# Patient Record
Sex: Male | Born: 1990 | Race: White | Hispanic: No | Smoking: Former smoker
Health system: Southern US, Community
[De-identification: ages and names within clinical notes are randomized; demographics above are authoritative.]

## PROBLEM LIST (undated history)

## (undated) DIAGNOSIS — J45909 Unspecified asthma, uncomplicated: Secondary | ICD-10-CM

## (undated) DIAGNOSIS — G43909 Migraine, unspecified, not intractable, without status migrainosus: Secondary | ICD-10-CM

## (undated) DIAGNOSIS — D3612 Benign neoplasm of peripheral nerves and autonomic nervous system, upper limb, including shoulder: Secondary | ICD-10-CM

## (undated) DIAGNOSIS — R0981 Nasal congestion: Secondary | ICD-10-CM

## (undated) DIAGNOSIS — F429 Obsessive-compulsive disorder, unspecified: Secondary | ICD-10-CM

## (undated) DIAGNOSIS — F329 Major depressive disorder, single episode, unspecified: Secondary | ICD-10-CM

## (undated) DIAGNOSIS — F4310 Post-traumatic stress disorder, unspecified: Secondary | ICD-10-CM

## (undated) DIAGNOSIS — F909 Attention-deficit hyperactivity disorder, unspecified type: Secondary | ICD-10-CM

## (undated) DIAGNOSIS — F913 Oppositional defiant disorder: Secondary | ICD-10-CM

## (undated) DIAGNOSIS — F32A Depression, unspecified: Secondary | ICD-10-CM

## (undated) DIAGNOSIS — R569 Unspecified convulsions: Secondary | ICD-10-CM

## (undated) HISTORY — PX: OTHER SURGICAL HISTORY: SHX169

## (undated) HISTORY — PX: TOOTH EXTRACTION: SUR596

## (undated) HISTORY — PX: TENDON LENGTHENING: SHX395

## (undated) HISTORY — DX: Oppositional defiant disorder: F91.3

## (undated) HISTORY — DX: Attention-deficit hyperactivity disorder, unspecified type: F90.9

---
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
## 2011-01-02 NOTE — Procedures (Signed)
EEG NUMBER:   CLINICAL HISTORY:  The patient is a 20 year old with episodes of staring  into space and unresponsiveness.  He has had some jerking with these  episodes.  Study is being done to look for presence of seizures  (780.02).   PROCEDURE:  The tracing is carried out on a 32-channel digital Cadwell  recorder reformatted into 16-channel montages with one devoted to EKG.  The patient was awake during the recording.  The [employer] 10/20  system lead placement was used.   DESCRIPTION OF FINDINGS:  Dominant frequency is a 10 Hz, 20-60 mcV  activity that is well-regulated and attenuates partially with eye  opening.   Background activity is a mixture of alpha and theta range components of  similar amplitudes and frontally predominant under 10 mcV beta range  activity.   Activating procedures were carried out.  Photic stimulation failed to  induce a driving response.  Hyperventilation caused no change in  background.   There was no focal slowing.  There was no interictal epileptiform  activity in the form of spikes or sharp waves.  EKG showed regular sinus  rhythm with ventricular response of 90 beats per minute.   IMPRESSION:  Normal waking record.      [name]. Sharene Skeans, M.D.  Electronically Signed     ZOX:WRUE  D:  01/20/2007 11:23:52  T:  01/20/2007 12:14:08  Job #:  [id number]
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
## 2011-01-10 ENCOUNTER — Encounter (HOSPITAL_COMMUNITY): Payer: Medicaid Other | Admitting: Psychiatry

## 2011-01-17 ENCOUNTER — Encounter (INDEPENDENT_AMBULATORY_CARE_PROVIDER_SITE_OTHER): Payer: Medicaid Other | Admitting: Psychiatry

## 2011-01-17 DIAGNOSIS — F909 Attention-deficit hyperactivity disorder, unspecified type: Secondary | ICD-10-CM

## 2011-01-17 DIAGNOSIS — F39 Unspecified mood [affective] disorder: Secondary | ICD-10-CM

## 2011-01-18 ENCOUNTER — Encounter (INDEPENDENT_AMBULATORY_CARE_PROVIDER_SITE_OTHER): Payer: Medicaid Other | Admitting: Psychology

## 2011-01-18 DIAGNOSIS — F39 Unspecified mood [affective] disorder: Secondary | ICD-10-CM

## 2011-02-19 ENCOUNTER — Encounter (INDEPENDENT_AMBULATORY_CARE_PROVIDER_SITE_OTHER): Payer: Medicaid Other | Admitting: Psychology

## 2011-02-19 DIAGNOSIS — F909 Attention-deficit hyperactivity disorder, unspecified type: Secondary | ICD-10-CM

## 2011-02-19 DIAGNOSIS — F39 Unspecified mood [affective] disorder: Secondary | ICD-10-CM

## 2011-03-07 ENCOUNTER — Encounter (INDEPENDENT_AMBULATORY_CARE_PROVIDER_SITE_OTHER): Payer: Medicaid Other | Admitting: Psychiatry

## 2011-03-07 DIAGNOSIS — F319 Bipolar disorder, unspecified: Secondary | ICD-10-CM

## 2011-03-07 DIAGNOSIS — F909 Attention-deficit hyperactivity disorder, unspecified type: Secondary | ICD-10-CM

## 2011-03-23 ENCOUNTER — Encounter (INDEPENDENT_AMBULATORY_CARE_PROVIDER_SITE_OTHER): Payer: Medicaid Other | Admitting: Psychology

## 2011-03-23 DIAGNOSIS — F909 Attention-deficit hyperactivity disorder, unspecified type: Secondary | ICD-10-CM

## 2011-03-23 DIAGNOSIS — F39 Unspecified mood [affective] disorder: Secondary | ICD-10-CM

## 2011-04-18 ENCOUNTER — Encounter (HOSPITAL_COMMUNITY): Payer: Medicaid Other | Admitting: Psychiatry

## 2011-04-25 ENCOUNTER — Encounter (HOSPITAL_COMMUNITY): Payer: Medicaid Other | Admitting: Psychology

## 2011-04-25 ENCOUNTER — Encounter (INDEPENDENT_AMBULATORY_CARE_PROVIDER_SITE_OTHER): Payer: Medicaid Other | Admitting: Psychiatry

## 2011-04-25 DIAGNOSIS — F39 Unspecified mood [affective] disorder: Secondary | ICD-10-CM

## 2011-04-25 DIAGNOSIS — F909 Attention-deficit hyperactivity disorder, unspecified type: Secondary | ICD-10-CM

## 2011-04-26 ENCOUNTER — Encounter (INDEPENDENT_AMBULATORY_CARE_PROVIDER_SITE_OTHER): Payer: Medicaid Other | Admitting: Psychology

## 2011-04-26 DIAGNOSIS — F909 Attention-deficit hyperactivity disorder, unspecified type: Secondary | ICD-10-CM

## 2011-04-26 DIAGNOSIS — F39 Unspecified mood [affective] disorder: Secondary | ICD-10-CM

## 2011-05-21 LAB — COMPREHENSIVE METABOLIC PANEL
ALT: 26
AST: 26
Alkaline Phosphatase: 124
CO2: 27
Chloride: 105
Glucose, Bld: 90
Sodium: 140
Total Bilirubin: 0.7

## 2011-05-21 LAB — URINE MICROSCOPIC-ADD ON

## 2011-05-21 LAB — VALPROIC ACID LEVEL: Valproic Acid Lvl: 79.6

## 2011-05-21 LAB — AMPHETAMINES URINE CONFIRMATION
Methamphetamine GC/MS, Ur: NEGATIVE
Methylenedioxyamphetamine: NEGATIVE
Methylenedioxyethylamphetamine: NEGATIVE

## 2011-05-21 LAB — URINALYSIS, ROUTINE W REFLEX MICROSCOPIC
Glucose, UA: NEGATIVE
Hgb urine dipstick: NEGATIVE
Leukocytes, UA: NEGATIVE
Protein, ur: 30 — AB

## 2011-05-21 LAB — DRUGS OF ABUSE SCREEN W/O ALC, ROUTINE URINE
Barbiturate Quant, Ur: NEGATIVE
Cocaine Metabolites: NEGATIVE
Phencyclidine (PCP): NEGATIVE

## 2011-05-21 LAB — LIPID PANEL
Triglycerides: 258 — ABNORMAL HIGH
VLDL: 52 — ABNORMAL HIGH

## 2011-05-21 LAB — GC/CHLAMYDIA PROBE AMP, URINE: Chlamydia, Swab/Urine, PCR: NEGATIVE

## 2011-05-21 LAB — HEPATIC FUNCTION PANEL
Albumin: 3.5
Total Bilirubin: 0.7
Total Protein: 6.1

## 2011-05-21 LAB — T3, FREE: T3, Free: 4 (ref 2.3–4.2)

## 2011-05-21 LAB — TSH: TSH: 6.128 — ABNORMAL HIGH

## 2011-05-23 ENCOUNTER — Encounter (HOSPITAL_COMMUNITY): Payer: Medicaid Other | Admitting: Psychiatry

## 2011-05-31 ENCOUNTER — Encounter (INDEPENDENT_AMBULATORY_CARE_PROVIDER_SITE_OTHER): Payer: Medicaid Other | Admitting: Psychology

## 2011-05-31 DIAGNOSIS — F39 Unspecified mood [affective] disorder: Secondary | ICD-10-CM

## 2011-05-31 DIAGNOSIS — F909 Attention-deficit hyperactivity disorder, unspecified type: Secondary | ICD-10-CM

## 2011-06-06 ENCOUNTER — Encounter (HOSPITAL_COMMUNITY): Payer: Medicaid Other | Admitting: Psychiatry

## 2011-06-06 LAB — STREP A DNA PROBE: Group A Strep Probe: NEGATIVE

## 2011-06-06 LAB — COMPREHENSIVE METABOLIC PANEL
Albumin: 3.2 — ABNORMAL LOW
Alkaline Phosphatase: 96
BUN: 18
Calcium: 7.7 — ABNORMAL LOW
Potassium: 3.8
Sodium: 131 — ABNORMAL LOW
Total Protein: 6.3

## 2011-06-06 LAB — CBC
Platelets: 173 — ABNORMAL LOW
RDW: 13.6

## 2011-06-06 LAB — RAPID STREP SCREEN (MED CTR MEBANE ONLY): Streptococcus, Group A Screen (Direct): NEGATIVE

## 2011-06-06 LAB — DIFFERENTIAL
Basophils Absolute: 0
Lymphocytes Relative: 7 — ABNORMAL LOW
Monocytes Absolute: 0 — ABNORMAL LOW
Neutro Abs: 5.9

## 2011-06-06 LAB — CULTURE, BLOOD (ROUTINE X 2)
Culture: NO GROWTH
Report Status: 7032008

## 2011-06-06 LAB — URINALYSIS, ROUTINE W REFLEX MICROSCOPIC
Glucose, UA: NEGATIVE
Hgb urine dipstick: NEGATIVE
Specific Gravity, Urine: >1.03 — ABNORMAL HIGH
pH: 6

## 2011-07-05 ENCOUNTER — Encounter (HOSPITAL_COMMUNITY): Payer: Medicaid Other | Admitting: Psychology

## 2011-07-05 ENCOUNTER — Ambulatory Visit (INDEPENDENT_AMBULATORY_CARE_PROVIDER_SITE_OTHER): Payer: Medicaid Other | Admitting: Psychology

## 2011-07-05 ENCOUNTER — Encounter (HOSPITAL_COMMUNITY): Payer: Self-pay | Admitting: Psychology

## 2011-07-05 DIAGNOSIS — F902 Attention-deficit hyperactivity disorder, combined type: Secondary | ICD-10-CM

## 2011-07-05 DIAGNOSIS — F909 Attention-deficit hyperactivity disorder, unspecified type: Secondary | ICD-10-CM

## 2011-07-05 NOTE — Progress Notes (Signed)
Patient:  [name]   DOB: 1991/06/11  MR Number: [id number]  Location: [facility] [address] [place] [address] Dept: [phone]  Start: 1 PM End: 2 PM  Provider/Observer:     Hershal Coria PSYD  Chief Complaint:      Chief Complaint  Patient presents with  . ADD    Reason For Service:     The patient was referred by his treating psychiatrist Dr. Lucianne Muss, who felt that he needed to engage in some psychotherapeutic interventions and building some of his coping skills. The patient has been seen by Dr. Lucianne Muss through the [facility] and then started seeing her here through [facility]. He is carried a number of diagnoses including conduct disorder with adolescent onset, attention deficit disorder with hyperactivity, and mood disorder NOS. He has been tried on a number of different medications including Concerta and Abilify as well as Depakote and Adderall. He does appear to be responding well to his current medications but there was a concern or interest in helping build more coping skills to do is reduced intellectual capacity and poor coping skills.  Interventions Strategy:  Cognitive/behavioral psychotherapy  Participation Level:   Active  Participation Quality:  Appropriate      Behavioral Observation:  Well Groomed, Alert, and Appropriate.   Current Psychosocial Factors: The patient reports that his girlfriend did turn out to be pregnant and after 3 months of not having a period she began feeling bad and she was taken into see a gynecologist. They did discover that she was in fact pregnant even though she had been negative on home pregnancy test. The problem with her feeling badly he had to do with the fact that she was miscarriage and she is no longer pregnant. The patient is girlfriend at began talking about getting married but family members have  strongly suggested they need to wait and the patient's family is concerned that his girlfriend is quite immature and has a lot of growing up to do. We addressed this issue today particularly with the patient's impulsivity and the fact that they need to take her time in this relationship.  Content of Session:   Reviewed her symptoms and continue to work on building coping skills and strategies.  Current Status:   The patient has done quite well and is clearly improving his ability to inhibit impulsive responding.  Patient Progress:   Very good  Target Goals:   Reduce impulsive and maladaptive behaviors and engage in better use of coping skills.  Last Reviewed:   07/05/2011  Goals Addressed Today:    Today the primary goal had to do with impulsive behaviors and the issue of him by planning a wedding with his girlfriend with whom he has been dating for just a couple of months was the primary focus of this topic today.  Impression/Diagnosis:   The patient has been diagnosed with a number of psychiatric conditions including mood disorder, attention deficit disorder, conduct disorder and other variations of these conditions. I do think that the most appropriate diagnosis at this time is one of attention deficit disorder combined type with the continued rule out of a mood disorder. I have not seen the patient in any significant fluctuation in mood state since I started seeing him. His mood is always been quite cheerful and pleasant with no indications of manic or hypomanic episodes or depressive episodes to this point.  Diagnosis:  Axis I:  1. Attention deficit disorder with hyperactivity   2. Attention deficit hyperactivity disorder, combined type         Axis II: No diagnosis

## 2011-07-18 ENCOUNTER — Encounter (HOSPITAL_COMMUNITY): Payer: Medicaid Other | Admitting: Psychiatry

## 2011-07-18 ENCOUNTER — Encounter (HOSPITAL_COMMUNITY): Payer: Self-pay | Admitting: Psychiatry

## 2011-07-18 ENCOUNTER — Ambulatory Visit (INDEPENDENT_AMBULATORY_CARE_PROVIDER_SITE_OTHER): Payer: Medicaid Other | Admitting: Psychiatry

## 2011-07-18 DIAGNOSIS — F909 Attention-deficit hyperactivity disorder, unspecified type: Secondary | ICD-10-CM

## 2011-07-18 DIAGNOSIS — F902 Attention-deficit hyperactivity disorder, combined type: Secondary | ICD-10-CM | POA: Insufficient documentation

## 2011-07-18 DIAGNOSIS — F39 Unspecified mood [affective] disorder: Secondary | ICD-10-CM | POA: Insufficient documentation

## 2011-07-18 MED ORDER — GUANFACINE HCL ER 3 MG PO TB24: 3.0000 mg | ORAL_TABLET | Freq: Every day | ORAL | Status: DC

## 2011-07-18 MED ORDER — AMPHETAMINE-DEXTROAMPHET ER 30 MG PO CP24: 30.0000 mg | ORAL_CAPSULE | ORAL | Status: DC

## 2011-07-18 NOTE — Progress Notes (Signed)
  [facility] [id number] Progress Note  RIGLEY NIESS [id number] 20 y.o.  07/18/2011 4:03 PM  Chief Complaint: I'm doing okay at school, also doing well with living by myself. I had a girlfriend but I broke up with her as she was very demanding. Mom feels that the patient is doing well living by himself but adds that he does visit his dad on regular basis.  Any symptoms of mania, any problems with aggression, any side effects any safety issues  History of Present Illness: Suicidal Ideation: No Plan Formed: No Patient has means to carry out plan: No  Homicidal Ideation: No Plan Formed: No Patient has means to carry out plan: No  Review of Systems: Psychiatric: Agitation: No Hallucination: No Depressed Mood: No Insomnia: No Hypersomnia: No Altered Concentration: No Feels Worthless: No Grandiose Ideas: No Belief In Special Powers: No New/Increased Substance Abuse: No Compulsions: No  Neurologic: Headache: No Seizure: No Paresthesias: No  Past Medical Family, Social History: In 12th grade  Outpatient Encounter Prescriptions as of 07/18/2011  Medication Sig Dispense Refill  . amphetamine-dextroamphetamine (ADDERALL XR) 30 MG 24 hr capsule Take 1 capsule (30 mg total) by mouth every morning.  30 capsule  0  . guanFACINE 3 MG TB24 Take 1 tablet (3 mg total) by mouth daily.  30 tablet  2  . DISCONTD: amphetamine-dextroamphetamine (ADDERALL XR) 30 MG 24 hr capsule Take 30 mg by mouth every morning.        Marland Kitchen DISCONTD: guanFACINE (INTUNIV) 2 MG TB24 Take 3 mg by mouth daily.          Past Psychiatric History/Hospitalization(s): Anxiety: No Bipolar Disorder: Yes Depression: Yes Mania: No Psychosis: No Schizophrenia: No Personality Disorder: No Hospitalization for psychiatric illness: Yes History of Electroconvulsive Shock Therapy: No Prior Suicide Attempts: No  Physical Exam: Constitutional:  BP 120/70  Ht 5\' 5"  (1.651 m)  Wt 136 lb (61.689 kg)  BMI 22.63  kg/m2  General Appearance: alert, oriented, no acute distress and well nourished  Musculoskeletal: Strength & Muscle Tone: within normal limits Gait & Station: normal Patient leans: N/A  Psychiatric: Speech (describe rate, volume, coherence, spontaneity, and abnormalities if any): Normal in volume rate and tone spontaneous  Thought Process (describe rate, content, abstract reasoning, and computation): Organized, goal-directed, age-appropriate  Associations: Intact  Thoughts: normal  Mental Status: Orientation: oriented to person, place, time/date and situation Mood & Affect: normal affect Attention Span & Concentration: OK  Medical Decision Making (Choose Three): Established Problem, Stable/Improving (1), Review of Psycho-Social Stressors (1), Review of Last Therapy Session (1) and Review of Medication Regimen & Side Effects (2)  Assessment: Axis I: Mood disorder NOS, ADHD combined type moderate severity  Axis II: Deferred  Axis III: Wears glasses, acne  Axis IV: Moderate  Axis V: 65   Plan: Continue Intuniv and Adderall XR. Discussed the need to keep his room clean adds the patient is living by himself. In regards to finances, mom is still helping the patient to pay his bills. Discussed the need for him to learn how to manage money in order to be completely independent. Also discussed in length choices in regards to relationships and the need to get to know someone prior to making long-term plans. Call when necessary Followup in 2 months  Nelly Rout, MD 07/18/2011

## 2011-07-28 ENCOUNTER — Other Ambulatory Visit (HOSPITAL_COMMUNITY): Payer: Self-pay | Admitting: Psychiatry

## 2011-08-02 ENCOUNTER — Ambulatory Visit (INDEPENDENT_AMBULATORY_CARE_PROVIDER_SITE_OTHER): Payer: Medicaid Other | Admitting: Psychology

## 2011-08-02 DIAGNOSIS — F909 Attention-deficit hyperactivity disorder, unspecified type: Secondary | ICD-10-CM

## 2011-08-10 NOTE — Progress Notes (Signed)
Patient:  [name]   DOB: 10-22-1990  MR Number: [id number]  Location: BEHAVIORAL [facility] [address] [place] [address] Dept: [phone]  Start: 1 PM End: 2 PM  Provider/Observer:     Hershal Coria PSYD  Chief Complaint:      Chief Complaint  Patient presents with  . ADD    Reason For Service:     The patient was referred by his treating psychiatrist Dr. Lucianne Muss, who felt that he needed to engage in some psychotherapeutic interventions and building some of his coping skills. The patient has been seen by Dr. Lucianne Muss through the [facility] mental health Department and then started seeing her here through [facility]. He is carried a number of diagnoses including conduct disorder with adolescent onset, attention deficit disorder with hyperactivity, and mood disorder NOS. He has been tried on a number of different medications including Concerta and Abilify as well as Depakote and Adderall. He does appear to be responding well to his current medications but there was a concern or interest in helping build more coping skills to do is reduced intellectual capacity and poor coping skills.  Interventions Strategy:  Cognitive/behavioral psychotherapy  Participation Level:   Active  Participation Quality:  Appropriate      Behavioral Observation:  Well Groomed, Alert, and Appropriate.   Current Psychosocial Factors: The patient reports that his girlfriend did turn out to be pregnant and after 3 months of not having a period she began feeling bad and she was taken into see a gynecologist. They did discover that she was in fact pregnant even though she had been negative on home pregnancy test. The problem with her feeling badly he had to do with the fact that she was miscarriage and she is no longer pregnant. The patient is girlfriend at began talking about getting married but family members have  strongly suggested they need to wait and the patient's family is concerned that his girlfriend is quite immature and has a lot of growing up to do. We addressed this issue today particularly with the patient's impulsivity and the fact that they need to take her time in this relationship.  Content of Session:   Reviewed her symptoms and continue to work on building coping skills and strategies.  Current Status:   The patient has done quite well and is clearly improving his ability to inhibit impulsive responding.  Patient Progress:   Very good  Target Goals:   Reduce impulsive and maladaptive behaviors and engage in better use of coping skills.  Last Reviewed:   08/02/2011  Goals Addressed Today:    We continue to work on goals related to his impulse control difficulties and making impulsive and poor judgments.  Impression/Diagnosis:   The patient has been diagnosed with a number of psychiatric conditions including mood disorder, attention deficit disorder, conduct disorder and other variations of these conditions. I do think that the most appropriate diagnosis at this time is one of attention deficit disorder combined type with the continued rule out of a mood disorder. I have not seen the patient in any significant fluctuation in mood state since I started seeing him. His mood is always been quite cheerful and pleasant with no indications of manic or hypomanic episodes or depressive episodes to this point.  Diagnosis:    Axis I:  1. Attention deficit disorder with hyperactivity         Axis II: No diagnosis

## 2011-08-30 ENCOUNTER — Ambulatory Visit (INDEPENDENT_AMBULATORY_CARE_PROVIDER_SITE_OTHER): Payer: Medicaid Other | Admitting: Psychology

## 2011-08-30 DIAGNOSIS — F909 Attention-deficit hyperactivity disorder, unspecified type: Secondary | ICD-10-CM

## 2011-08-30 NOTE — Progress Notes (Signed)
Patient:  [name]   DOB: [birth year]  MR Number: [id number]  Location: BEHAVIORAL [facility] [address] [place] [address] Dept: [phone]  Start: 1 PM End: 2 PM  Provider/Observer:     Hershal Coria PSYD  Chief Complaint:      Chief Complaint  Patient presents with  . ADHD  . Stress  . Family Problem    Reason For Service:     The patient was referred by his treating psychiatrist Dr. Lucianne Muss, who felt that he needed to engage in some psychotherapeutic interventions and building some of his coping skills. The patient has been seen by Dr. Lucianne Muss through the [facility] mental health Department and then started seeing her here through [facility]. He is carried a number of diagnoses including conduct disorder with adolescent onset, attention deficit disorder with hyperactivity, and mood disorder NOS. He has been tried on a number of different medications including Concerta and Abilify as well as Depakote and Adderall. He does appear to be responding well to his current medications but there was a concern or interest in helping build more coping skills to do is reduced intellectual capacity and poor coping skills.  Interventions Strategy:  Cognitive/behavioral psychotherapy  Participation Level:   Active  Participation Quality:  Appropriate      Behavioral Observation:  Well Groomed, Alert, and Appropriate.   Current Psychosocial Factors: The patient has ended his engagement with his girlfriend and already has begun talking with the previous girlfriend spending time with her. His "fiance" that he is separated from his daughter that they will get back together 2 weeks before valentines. I pointed out to him that if there was an issue someone had with a relationship that a set period of time would be hard to gauge when that issues with magically disappear if people didn't work on them. In  fact, the patient admits to spending almost $1000 on her over Christmas and then in breaking up soon after that. The patient is on disability and does not have money to be spending like this. We pointed out the possible issue of her wanting to get back together just before valentines and the fact that this is a very immature thinking and neither they want to be in a relationship or not and that somehow valentines was given a magically fix everything was very immature and thinking.  Content of Session:   Reviewed her symptoms and continue to work on building coping skills and strategies.  Current Status:   The patient has done quite well and is clearly improving his ability to inhibit impulsive responding.  Patient Progress:   Very good  Target Goals:   Reduce impulsive and maladaptive behaviors and engage in better use of coping skills.  Last Reviewed:   08/30/2011  Goals Addressed Today:    Today we continued to work on building coping skills to deal with more dull aspects of his life. The patient had become engaged with a young woman who is just out of high school and from his reports have been very immature similar to him. We have talked about the potential that this engagement may have some real problems along the weight to help him prepare for that. In fact, this has happened recently and they have stopped the engagement. We talked about developing his own personal skills is not a situation where he feels like he has to be in a relationship with someone and  that he built his skills..  Impression/Diagnosis:   The patient has been diagnosed with a number of psychiatric conditions including mood disorder, attention deficit disorder, conduct disorder and other variations of these conditions. I do think that the most appropriate diagnosis at this time is one of attention deficit disorder combined type with the continued rule out of a mood disorder. I have not seen the patient in any significant  fluctuation in mood state since I started seeing him. His mood is always been quite cheerful and pleasant with no indications of manic or hypomanic episodes or depressive episodes to this point.  Diagnosis:    Axis I:  1. Attention deficit disorder with hyperactivity         Axis II: No diagnosis

## 2011-09-19 ENCOUNTER — Ambulatory Visit (INDEPENDENT_AMBULATORY_CARE_PROVIDER_SITE_OTHER): Payer: Medicaid Other | Admitting: Psychiatry

## 2011-09-19 ENCOUNTER — Encounter (HOSPITAL_COMMUNITY): Payer: Self-pay | Admitting: Psychiatry

## 2011-09-19 VITALS — BP 120/78 | Ht 65.0 in | Wt 135.0 lb

## 2011-09-19 DIAGNOSIS — F902 Attention-deficit hyperactivity disorder, combined type: Secondary | ICD-10-CM

## 2011-09-19 DIAGNOSIS — F909 Attention-deficit hyperactivity disorder, unspecified type: Secondary | ICD-10-CM

## 2011-09-19 DIAGNOSIS — F39 Unspecified mood [affective] disorder: Secondary | ICD-10-CM

## 2011-09-19 MED ORDER — AMPHETAMINE-DEXTROAMPHET ER 30 MG PO CP24: 30.0000 mg | ORAL_CAPSULE | ORAL | Status: DC

## 2011-09-19 MED ORDER — AMPHETAMINE-DEXTROAMPHET ER 30 MG PO CP24: 30.0000 mg | ORAL_CAPSULE | Freq: Every day | ORAL | Status: DC

## 2011-09-19 MED ORDER — GUANFACINE HCL ER 3 MG PO TB24: 3.0000 mg | ORAL_TABLET | Freq: Every day | ORAL | Status: DC

## 2011-09-19 NOTE — Progress Notes (Signed)
Patient ID: [name], male   DOB: 1991-04-04, 21 y.o.   MRN: [id number]  [facility] [address] Progress Note  HAROUT SCHEURICH [id number] 21 y.o.  09/19/2011 11:14 AM  Chief Complaint: I'm doing okay at school, i have only math that I need in order to graduate. I am also doing well with living by myself. I had a fiance but I broke up with her as she was very demanding. Mom feels that the patient is doing well living by himself but adds that he does  still struggle with managing his finances and so Mom is still managing them.Patient still  his dad on regular basis.   Patient denies any symptoms of mania, any problems with aggression, any side effects any safety issues  History of Present Illness: Suicidal Ideation: No Plan Formed: No Patient has means to carry out plan: No  Homicidal Ideation: No Plan Formed: No Patient has means to carry out plan: No  Review of Systems: Psychiatric: Agitation: No Hallucination: No Depressed Mood: No Insomnia: No Hypersomnia: No Altered Concentration: No Feels Worthless: No Grandiose Ideas: No Belief In Special Powers: No New/Increased Substance Abuse: No Compulsions: No  Neurologic: Headache: No Seizure: No Paresthesias: No  Past Medical Family, Social History: In 12th grade  Outpatient Encounter Prescriptions as of 09/19/2011  Medication Sig Dispense Refill  . amphetamine-dextroamphetamine (ADDERALL XR) 30 MG 24 hr capsule Take 1 capsule (30 mg total) by mouth every morning.  30 capsule  0  . GuanFACINE HCl 3 MG TB24 Take 1 tablet (3 mg total) by mouth daily.  30 tablet  2  . DISCONTD: amphetamine-dextroamphetamine (ADDERALL XR) 30 MG 24 hr capsule Take 1 capsule (30 mg total) by mouth every morning.  30 capsule  0  . DISCONTD: guanFACINE 3 MG TB24 Take 1 tablet (3 mg total) by mouth daily.  30 tablet  2  . amphetamine-dextroamphetamine (ADDERALL XR) 30 MG 24 hr capsule Take 1 capsule (30 mg total) by mouth daily.  30  capsule  0    Past Psychiatric History/Hospitalization(s): Anxiety: No Bipolar Disorder: Yes Depression: Yes Mania: No Psychosis: No Schizophrenia: No Personality Disorder: No Hospitalization for psychiatric illness: Yes History of Electroconvulsive Shock Therapy: No Prior Suicide Attempts: No  Physical Exam: Constitutional:  BP 120/78  Ht 5\' 5"  (1.651 m)  Wt 135 lb (61.236 kg)  BMI 22.47 kg/m2  General Appearance: alert, oriented, no acute distress and well nourished  Musculoskeletal: Strength & Muscle Tone: within normal limits Gait & Station: normal Patient leans: N/A  Psychiatric: Speech (describe rate, volume, coherence, spontaneity, and abnormalities if any): Normal in volume rate and tone spontaneous  Thought Process (describe rate, content, abstract reasoning, and computation): Organized, goal-directed, age-appropriate  Associations: Intact  Thoughts: normal  Mental Status: Orientation: oriented to person, place, time/date and situation Mood & Affect: normal affect Attention Span & Concentration: OK  Medical Decision Making (Choose Three): Established Problem, Stable/Improving (1), Review of Psycho-Social Stressors (1), Review of Last Therapy Session (1) and Review of Medication Regimen & Side Effects (2)  Assessment: Axis I: Mood disorder NOS, ADHD combined type moderate severity  Axis II: Deferred  Axis III: Wears glasses, acne  Axis IV: Moderate  Axis V: 65   Plan: Continue Intuniv and Adderall XR. Discussed  again the need to keep his apartment clean.  In regards to finances, mom is still helping the patient to pay his bills. Discussed the need for him to learn how to manage money in  order to be completely independent. Also discussed in length choices in regards to relationships and the need to get to know someone prior to making long-term plans. Call when necessary Continue to see therapist regularly. Followup in 2 months  Nelly Rout,  MD 09/19/2011

## 2011-09-27 ENCOUNTER — Ambulatory Visit (INDEPENDENT_AMBULATORY_CARE_PROVIDER_SITE_OTHER): Payer: Medicaid Other | Admitting: Psychology

## 2011-09-27 DIAGNOSIS — F909 Attention-deficit hyperactivity disorder, unspecified type: Secondary | ICD-10-CM

## 2011-09-27 DIAGNOSIS — F39 Unspecified mood [affective] disorder: Secondary | ICD-10-CM

## 2011-10-11 ENCOUNTER — Telehealth (HOSPITAL_COMMUNITY): Payer: Self-pay | Admitting: *Deleted

## 2011-10-19 ENCOUNTER — Encounter (HOSPITAL_COMMUNITY): Payer: Self-pay | Admitting: Psychology

## 2011-10-19 NOTE — Progress Notes (Signed)
Patient:  [name]   DOB: 06/13/1991  MR Number: [id number]  Location: BEHAVIORAL [facility] [address] [place] [address] Dept: [phone]  Start: 11:30 AM End: 12:30 PM  Provider/Observer:     Hershal Coria PSYD  Chief Complaint:      Chief Complaint  Patient presents with  . Anxiety  . Stress  . Manic Behavior  . ADHD    Reason For Service:     The patient was referred by his treating psychiatrist Dr. Lucianne Muss, who felt that he needed to engage in some psychotherapeutic interventions and building some of his coping skills. The patient has been seen by Dr. Lucianne Muss through the [facility] mental health Department and then started seeing her here through [facility]. He is carried a number of diagnoses including conduct disorder with adolescent onset, attention deficit disorder with hyperactivity, and mood disorder NOS. He has been tried on a number of different medications including Concerta and Abilify as well as Depakote and Adderall. He does appear to be responding well to his current medications but there was a concern or interest in helping build more coping skills to do is reduced intellectual capacity and poor coping skills.  Interventions Strategy:  Cognitive/behavioral psychotherapy  Participation Level:   Active  Participation Quality:  Appropriate      Behavioral Observation:  Well Groomed, Alert, and Appropriate.   Current Psychosocial Factors: The patient is doing better now that he has gotten over the breakup of his relationship with his girlfriend. However, he continues to impulsive with balance between ideas about various girls and we talked about this type of behavior pattern is quite difficult for him. how.  Content of Session:   Reviewed her symptoms and continue to work on building coping skills and strategies.  Current Status:   The patient has done quite well  and is clearly improving his ability to inhibit impulsive responding.  Patient Progress:   Very good  Target Goals:   Reduce impulsive and maladaptive behaviors and engage in better use of coping skills.  Last Reviewed:   09/27/2011  Goals Addressed Today:    Today we worked on the significant impulsive and maladaptive behaviors that he has particular around his relationships with girls and his desire to have a girlfriend and/or fianc..  Impression/Diagnosis:   The patient has been diagnosed with a number of psychiatric conditions including mood disorder, attention deficit disorder, conduct disorder and other variations of these conditions. I do think that the most appropriate diagnosis at this time is one of attention deficit disorder combined type with the continued rule out of a mood disorder. I have not seen the patient in any significant fluctuation in mood state since I started seeing him. His mood is always been quite cheerful and pleasant with no indications of manic or hypomanic episodes or depressive episodes to this point.  Diagnosis:    Axis I:  1. Unspecified episodic mood disorder   2. Attention deficit disorder with hyperactivity         Axis II: No diagnosis

## 2011-10-25 ENCOUNTER — Ambulatory Visit (INDEPENDENT_AMBULATORY_CARE_PROVIDER_SITE_OTHER): Payer: Medicaid Other | Admitting: Psychology

## 2011-10-25 ENCOUNTER — Encounter (HOSPITAL_COMMUNITY): Payer: Self-pay | Admitting: Psychology

## 2011-10-25 DIAGNOSIS — F39 Unspecified mood [affective] disorder: Secondary | ICD-10-CM

## 2011-10-25 DIAGNOSIS — F909 Attention-deficit hyperactivity disorder, unspecified type: Secondary | ICD-10-CM

## 2011-10-25 NOTE — Progress Notes (Signed)
Patient:  [name]   DOB: 1991-02-17  MR Number: [id number]  Location: BEHAVIORAL [facility] [address] [place] [address] Dept: [phone]  Start: 11:30 AM End: 12:30 PM  Provider/Observer:     Hershal Coria PSYD  Chief Complaint:      Chief Complaint  Patient presents with  . Anxiety  . Depression  . Agitation  . ADHD    Reason For Service:     The patient was referred by his treating psychiatrist Dr. Lucianne Muss, who felt that he needed to engage in some psychotherapeutic interventions and building some of his coping skills. The patient has been seen by Dr. Lucianne Muss through the [facility] mental health Department and then started seeing her here through [facility]. He is carried a number of diagnoses including conduct disorder with adolescent onset, attention deficit disorder with hyperactivity, and mood disorder NOS. He has been tried on a number of different medications including Concerta and Abilify as well as Depakote and Adderall. He does appear to be responding well to his current medications but there was a concern or interest in helping build more coping skills to do is reduced intellectual capacity and poor coping skills.  Interventions Strategy:  Cognitive/behavioral psychotherapy  Participation Level:   Active  Participation Quality:  Appropriate      Behavioral Observation:  Well Groomed, Alert, and Appropriate.   Current Psychosocial Factors: The patient reports that he is back with [name] who he had broken off with because of her controlling behaviors. However, the patient reports he has taken advice to work on this relationship very slowly. It is clear that the patient has a tremendous drive to have a significant relationship but this is part of his impulsive types of behaviors and wards against episodes of depression..  Content of Session:   Reviewed her  symptoms and continue to work on building coping skills and strategies.  Current Status:   The patient has done quite well and is clearly improving his ability to inhibit impulsive responding.  Patient Progress:   Very good  Target Goals:   Reduce impulsive and maladaptive behaviors and engage in better use of coping skills.  Last Reviewed:   10/25/2011  Goals Addressed Today:    We continued work on issues related to his extremely impulsive types of behaviors and his, spastic attempts to get other people to be involved in his life.  Impression/Diagnosis:   The patient has been diagnosed with a number of psychiatric conditions including mood disorder, attention deficit disorder, conduct disorder and other variations of these conditions. I do think that the most appropriate diagnosis at this time is one of attention deficit disorder combined type with the continued rule out of a mood disorder. I have not seen the patient in any significant fluctuation in mood state since I started seeing him. His mood is always been quite cheerful and pleasant with no indications of manic or hypomanic episodes or depressive episodes to this point.  Diagnosis:    Axis I:  1. Unspecified episodic mood disorder   2. Unspecified hyperkinetic syndrome of childhood         Axis II: No diagnosis

## 2011-11-14 ENCOUNTER — Encounter (HOSPITAL_COMMUNITY): Payer: Self-pay | Admitting: Psychiatry

## 2011-11-14 ENCOUNTER — Ambulatory Visit (INDEPENDENT_AMBULATORY_CARE_PROVIDER_SITE_OTHER): Payer: Medicaid Other | Admitting: Psychiatry

## 2011-11-14 VITALS — BP 112/74 | Ht 65.0 in | Wt 139.4 lb

## 2011-11-14 DIAGNOSIS — F902 Attention-deficit hyperactivity disorder, combined type: Secondary | ICD-10-CM

## 2011-11-14 DIAGNOSIS — F909 Attention-deficit hyperactivity disorder, unspecified type: Secondary | ICD-10-CM

## 2011-11-14 MED ORDER — AMPHETAMINE-DEXTROAMPHET ER 30 MG PO CP24: 30.0000 mg | ORAL_CAPSULE | ORAL | Status: DC

## 2011-11-14 MED ORDER — GUANFACINE HCL ER 3 MG PO TB24
3.0000 mg | ORAL_TABLET | Freq: Every day | ORAL | Status: DC
Start: 2011-11-14 — End: 2012-01-09

## 2011-11-14 MED ORDER — AMPHETAMINE-DEXTROAMPHET ER 30 MG PO CP24: 30.0000 mg | ORAL_CAPSULE | Freq: Every day | ORAL | Status: DC

## 2011-11-14 NOTE — Progress Notes (Signed)
Patient ID: [name], male   DOB: 1990-09-12, 21 y.o.   MRN: [id number]  [facility] [id number] Progress Note  [name] [id number] 21 y.o.  11/14/2011 11:06 AM  Chief Complaint: I'm doing okay at school, I have only math that I need in order to graduate. I am  Now living with a friend as I got evicted as I had a dog and did not have permission. I have a male friend. Mom feels that the patient is doing well  but adds that he does  still struggle with managing his finances as he spends more than what he gets every month.Patient still  his dad on regular basis.   Patient denies any symptoms of mania, any problems with aggression, any side effects any safety issues  History of Present Illness: Suicidal Ideation: No Plan Formed: No Patient has means to carry out plan: No  Homicidal Ideation: No Plan Formed: No Patient has means to carry out plan: No  Review of Systems: Psychiatric: Agitation: No Hallucination: No Depressed Mood: No Insomnia: No Hypersomnia: No Altered Concentration: No Feels Worthless: No Grandiose Ideas: No Belief In Special Powers: No New/Increased Substance Abuse: No Compulsions: No  Neurologic: Headache: No Seizure: No Paresthesias: No  Past Medical Family, Social History: In 12th grade  Outpatient Encounter Prescriptions as of 11/14/2011  Medication Sig Dispense Refill  . amphetamine-dextroamphetamine (ADDERALL XR) 30 MG 24 hr capsule Take 1 capsule (30 mg total) by mouth every morning.  30 capsule  0  . amphetamine-dextroamphetamine (ADDERALL XR) 30 MG 24 hr capsule Take 1 capsule (30 mg total) by mouth daily.  30 capsule  0  . GuanFACINE HCl 3 MG TB24 Take 1 tablet (3 mg total) by mouth daily.  30 tablet  2  . DISCONTD: amphetamine-dextroamphetamine (ADDERALL XR) 30 MG 24 hr capsule Take 1 capsule (30 mg total) by mouth every morning.  30 capsule  0  . DISCONTD: amphetamine-dextroamphetamine (ADDERALL XR) 30 MG 24 hr capsule Take  1 capsule (30 mg total) by mouth daily.  30 capsule  0  . DISCONTD: GuanFACINE HCl 3 MG TB24 Take 1 tablet (3 mg total) by mouth daily.  30 tablet  2    Past Psychiatric History/Hospitalization(s): Anxiety: No Bipolar Disorder: Yes Depression: Yes Mania: No Psychosis: No Schizophrenia: No Personality Disorder: No Hospitalization for psychiatric illness: Yes History of Electroconvulsive Shock Therapy: No Prior Suicide Attempts: No  Physical Exam: Constitutional:  There were no vitals taken for this visit.  General Appearance: alert, oriented, no acute distress and well nourished  Musculoskeletal: Strength & Muscle Tone: within normal limits Gait & Station: normal Patient leans: N/A  Psychiatric: Speech (describe rate, volume, coherence, spontaneity, and abnormalities if any): Normal in volume rate and tone spontaneous  Thought Process (describe rate, content, abstract reasoning, and computation): Organized, goal-directed, age-appropriate  Associations: Intact  Thoughts: normal  Mental Status: Orientation: oriented to person, place, time/date and situation Mood & Affect: normal affect Attention Span & Concentration: OK  Medical Decision Making (Choose Three): Established Problem, Stable/Improving (1), Review of Psycho-Social Stressors (1), Review of Last Therapy Session (1) and Review of Medication Regimen & Side Effects (2)  Assessment: Axis I: Mood disorder NOS, ADHD combined type moderate severity  Axis II: Deferred  Axis III: Wears glasses, acne  Axis IV: Moderate  Axis V: 65   Plan: Continue Intuniv and Adderall XR. Discussed the need to graduate, get extra help in Math as patient is struggling.  In regards  to finances, mom is still helping the patient to pay his bills. Discussed the need for him to learn how to manage money in order to be completely independent and also spend less than what patient gets every month. Also discussed in length choices in  regards to relationships and the need to get to know someone prior to making long-term plans. Call when necessary Continue to see therapist regularly. Followup in 2 months  Nelly Rout, MD 11/14/2011

## 2011-12-04 ENCOUNTER — Ambulatory Visit (INDEPENDENT_AMBULATORY_CARE_PROVIDER_SITE_OTHER): Payer: Medicaid Other | Admitting: Psychology

## 2011-12-04 DIAGNOSIS — F39 Unspecified mood [affective] disorder: Secondary | ICD-10-CM

## 2011-12-04 DIAGNOSIS — F909 Attention-deficit hyperactivity disorder, unspecified type: Secondary | ICD-10-CM

## 2011-12-05 ENCOUNTER — Encounter (HOSPITAL_COMMUNITY): Payer: Self-pay | Admitting: Psychology

## 2011-12-05 NOTE — Progress Notes (Signed)
Patient:  [name]   DOB: [birth year]  MR Number: [id number]  Location: BEHAVIORAL [facility] [address] [place] [address] Dept: [phone]  Start: 1 PM End: 2 PM  Provider/Observer:     Hershal Coria PSYD  Chief Complaint:      Chief Complaint  Patient presents with  . Stress  . ADHD  . Agitation    Reason For Service:     The patient was referred by his treating psychiatrist Dr. Lucianne Muss, who felt that he needed to engage in some psychotherapeutic interventions and building some of his coping skills. The patient has been seen by Dr. Lucianne Muss through the [facility] mental health Department and then started seeing her here through [facility]. He is carried a number of diagnoses including conduct disorder with adolescent onset, attention deficit disorder with hyperactivity, and mood disorder NOS. He has been tried on a number of different medications including Concerta and Abilify as well as Depakote and Adderall. He does appear to be responding well to his current medications but there was a concern or interest in helping build more coping skills to do is reduced intellectual capacity and poor coping skills.  Interventions Strategy:  Cognitive/behavioral psychotherapy  Participation Level:   Active  Participation Quality:  Appropriate      Behavioral Observation:  Well Groomed, Alert, and Appropriate.   Current Psychosocial Factors: The patient reports that he was able to get a companion dog which is a [place] and he appears to be taking great care of it and very focused on it. However, he is impulsively thinking that he is going to buy his mother's house now that she is trying to sell it although did sales for $80,000.  The patient has a monthly income of $700 and therefore this is extremely impractical and an example of some of the impractical thinking that he has.  Content of  Session:   Reviewed her symptoms and continue to work on building coping skills and strategies.  Current Status:   The patient has done quite well and is clearly improving his ability to inhibit impulsive responding.  Patient Progress:   Very good  Target Goals:   Reduce impulsive and maladaptive behaviors and engage in better use of coping skills.  Last Reviewed:   12/04/2011  Goals Addressed Today:    Today we worked on manipulative and impulsive types of behaviors that the patient continues to seem to engage in. The patient was rather hyper today and presenting irrational types of thinking around purchasing a home that his mother currently ends in house for sale. We uses this as an example to further work on the target goal of better healing within coping with impulsive types of behaviors.  Impression/Diagnosis:   The patient has been diagnosed with a number of psychiatric conditions including mood disorder, attention deficit disorder, conduct disorder and other variations of these conditions. I do think that the most appropriate diagnosis at this time is one of attention deficit disorder combined type with the continued rule out of a mood disorder. I have not seen the patient in any significant fluctuation in mood state since I started seeing him. His mood is always been quite cheerful and pleasant with no indications of manic or hypomanic episodes or depressive episodes to this point.  Diagnosis:    Axis I:  1. Unspecified episodic mood disorder   2. Unspecified hyperkinetic syndrome of childhood  Axis II: No diagnosis

## 2011-12-31 ENCOUNTER — Ambulatory Visit (INDEPENDENT_AMBULATORY_CARE_PROVIDER_SITE_OTHER): Payer: Medicaid Other | Admitting: Psychology

## 2011-12-31 DIAGNOSIS — F909 Attention-deficit hyperactivity disorder, unspecified type: Secondary | ICD-10-CM

## 2011-12-31 DIAGNOSIS — F39 Unspecified mood [affective] disorder: Secondary | ICD-10-CM

## 2012-01-02 ENCOUNTER — Encounter (HOSPITAL_COMMUNITY): Payer: Self-pay | Admitting: Psychology

## 2012-01-02 NOTE — Progress Notes (Signed)
Patient:  [name]   DOB: 1990/10/30  MR Number: [id number]  Location: BEHAVIORAL [facility] [address] [place] [address] Dept: [phone]  Start: 1 PM End: 2 PM  Provider/Observer:     Hershal Coria PSYD  Chief Complaint:      Chief Complaint  Patient presents with  . Agitation  . Stress  . Manic Behavior    Reason For Service:     The patient was referred by his treating psychiatrist Dr. Lucianne Muss, who felt that he needed to engage in some psychotherapeutic interventions and building some of his coping skills. The patient has been seen by Dr. Lucianne Muss through the [facility] mental health Department and then started seeing her here through [facility]. He is carried a number of diagnoses including conduct disorder with adolescent onset, attention deficit disorder with hyperactivity, and mood disorder NOS. He has been tried on a number of different medications including Concerta and Abilify as well as Depakote and Adderall. He does appear to be responding well to his current medications but there was a concern or interest in helping build more coping skills to do is reduced intellectual capacity and poor coping skills.  Interventions Strategy:  Cognitive/behavioral psychotherapy  Participation Level:   Active  Participation Quality:  Appropriate      Behavioral Observation:  Well Groomed, Alert, and Appropriate.   Current Psychosocial Factors: The patient is continued a more stable and realistic relationship with his girlfriend. He reports that this is helping him cope better now that he is not looking at [facility] in this relationship and trying to get engaged or other relatively unrealistic types of activities. He does describe some significant stress he still experiences from her with regard to her expectations are unrealistic based on his financial, vocational, and living  situations.  Content of Session:   Reviewed her symptoms and continue to work on building coping skills and strategies.  Current Status:   The patient is continuing to show improvement in his judgment and understanding of situations. The last visit he was more manic than he was this visit and it reflected in his unrealistic desires to try by his mother's house. There is no financial way that he could achieve this and is working a more realistic goals.  Patient Progress:   Very good  Target Goals:   Reduce impulsive and maladaptive behaviors and engage in better use of coping skills.  Last Reviewed:   12/31/2011  Goals Addressed Today:    We continue to work on target goals of reducing the impulsive and manipulative behaviors and better at implementation of coping skills for judgment and future planning.  Impression/Diagnosis:   The patient has been diagnosed with a number of psychiatric conditions including mood disorder, attention deficit disorder, conduct disorder and other variations of these conditions. I do think that the most appropriate diagnosis at this time is one of attention deficit disorder combined type with the continued rule out of a mood disorder. I have not seen the patient in any significant fluctuation in mood state since I started seeing him. His mood is always been quite cheerful and pleasant with no indications of manic or hypomanic episodes or depressive episodes to this point.  Diagnosis:    Axis I:  1. Mood disorder   2. Unspecified hyperkinetic syndrome of childhood         Axis II: No diagnosis

## 2012-01-09 ENCOUNTER — Telehealth (HOSPITAL_COMMUNITY): Payer: Self-pay | Admitting: *Deleted

## 2012-01-09 ENCOUNTER — Ambulatory Visit (HOSPITAL_COMMUNITY): Payer: Medicaid Other | Admitting: Psychiatry

## 2012-01-09 ENCOUNTER — Ambulatory Visit (HOSPITAL_COMMUNITY): Payer: Self-pay | Admitting: Psychiatry

## 2012-01-09 DIAGNOSIS — F902 Attention-deficit hyperactivity disorder, combined type: Secondary | ICD-10-CM

## 2012-01-09 MED ORDER — GUANFACINE HCL ER 3 MG PO TB24: 3.0000 mg | ORAL_TABLET | Freq: Every day | ORAL | Status: DC

## 2012-01-09 MED ORDER — AMPHETAMINE-DEXTROAMPHET ER 30 MG PO CP24
30.0000 mg | ORAL_CAPSULE | Freq: Every day | ORAL | Status: DC
Start: 2012-01-09 — End: 2012-02-13

## 2012-01-09 NOTE — Telephone Encounter (Signed)
Refill done.  

## 2012-01-31 ENCOUNTER — Ambulatory Visit (INDEPENDENT_AMBULATORY_CARE_PROVIDER_SITE_OTHER): Payer: MEDICAID | Admitting: Psychology

## 2012-01-31 DIAGNOSIS — F39 Unspecified mood [affective] disorder: Secondary | ICD-10-CM

## 2012-02-13 ENCOUNTER — Encounter (HOSPITAL_COMMUNITY): Payer: Self-pay | Admitting: Psychiatry

## 2012-02-13 ENCOUNTER — Ambulatory Visit (INDEPENDENT_AMBULATORY_CARE_PROVIDER_SITE_OTHER): Payer: Medicaid Other | Admitting: Psychiatry

## 2012-02-13 VITALS — BP 124/76 | Ht 65.0 in | Wt 147.4 lb

## 2012-02-13 DIAGNOSIS — F909 Attention-deficit hyperactivity disorder, unspecified type: Secondary | ICD-10-CM

## 2012-02-13 DIAGNOSIS — F902 Attention-deficit hyperactivity disorder, combined type: Secondary | ICD-10-CM

## 2012-02-13 DIAGNOSIS — F39 Unspecified mood [affective] disorder: Secondary | ICD-10-CM

## 2012-02-13 MED ORDER — AMPHETAMINE-DEXTROAMPHET ER 30 MG PO CP24: 30.0000 mg | ORAL_CAPSULE | ORAL | Status: DC

## 2012-02-13 MED ORDER — AMPHETAMINE-DEXTROAMPHET ER 30 MG PO CP24: 30.0000 mg | ORAL_CAPSULE | Freq: Every day | ORAL | Status: DC

## 2012-02-13 MED ORDER — GUANFACINE HCL ER 3 MG PO TB24: 3.0000 mg | ORAL_TABLET | Freq: Every day | ORAL | Status: DC

## 2012-02-13 NOTE — Progress Notes (Signed)
Patient ID: [name], male   DOB: 1991/03/30, 21 y.o.   MRN: [id number]  [facility] [address] Progress Note  [name] [id number] 21 y.o.  02/13/2012 11:37 AM  Chief Complaint:  I have only Algebra 1 that I need in order to graduate. I know I am making poor choices in regards to girls. I did get a year of supervised probation for helping a 20 year old girl who wanted to run away from home.I am otherwise doing well. Patient still sees  his dad on regular basis.   Patient denies any symptoms of mania, any problems with aggression, any side effects, any safety issues  History of Present Illness: Suicidal Ideation: No Plan Formed: No Patient has means to carry out plan: No  Homicidal Ideation: No Plan Formed: No Patient has means to carry out plan: No  Review of Systems: Psychiatric: Agitation: No Hallucination: No Depressed Mood: No Insomnia: No Hypersomnia: No Altered Concentration: No Feels Worthless: No Grandiose Ideas: No Belief In Special Powers: No New/Increased Substance Abuse: No Compulsions: No  Neurologic: Headache: No Seizure: No Paresthesias: No  Past Medical Family, Social History: In 12th grade, lives with a friend and his family  Outpatient Encounter Prescriptions as of 02/13/2012  Medication Sig Dispense Refill  . amphetamine-dextroamphetamine (ADDERALL XR) 30 MG 24 hr capsule Take 1 capsule (30 mg total) by mouth daily.  30 capsule  0  . amphetamine-dextroamphetamine (ADDERALL XR) 30 MG 24 hr capsule Take 1 capsule (30 mg total) by mouth every morning.  30 capsule  0  . GuanFACINE HCl 3 MG TB24 Take 1 tablet (3 mg total) by mouth daily.  30 tablet  2  . DISCONTD: amphetamine-dextroamphetamine (ADDERALL XR) 30 MG 24 hr capsule Take 1 capsule (30 mg total) by mouth every morning.  30 capsule  0  . DISCONTD: amphetamine-dextroamphetamine (ADDERALL XR) 30 MG 24 hr capsule Take 1 capsule (30 mg total) by mouth daily.  30 capsule  0  .  DISCONTD: GuanFACINE HCl 3 MG TB24 Take 1 tablet (3 mg total) by mouth daily.  30 tablet  2    Past Psychiatric History/Hospitalization(s): Anxiety: No Bipolar Disorder: Yes Depression: Yes Mania: No Psychosis: No Schizophrenia: No Personality Disorder: No Hospitalization for psychiatric illness: Yes History of Electroconvulsive Shock Therapy: No Prior Suicide Attempts: No  Physical Exam: Constitutional:  BP 124/76  Ht 5\' 5"  (1.651 m)  Wt 147 lb 6.4 oz (66.86 kg)  BMI 24.53 kg/m2  General Appearance: alert, oriented, no acute distress and well nourished  Musculoskeletal: Strength & Muscle Tone: within normal limits Gait & Station: normal Patient leans: N/A  Psychiatric: Speech (describe rate, volume, coherence, spontaneity, and abnormalities if any): Normal in volume rate and tone spontaneous  Thought Process (describe rate, content, abstract reasoning, and computation): Organized, goal-directed, age-appropriate  Associations: Intact  Thoughts: normal  Mental Status: Orientation: oriented to person, place, time/date and situation Mood & Affect: normal affect Attention Span & Concentration: OK  Medical Decision Making (Choose Three): Established Problem, Stable/Improving (1), Review of Psycho-Social Stressors (1), Review of Last Therapy Session (1) and Review of Medication Regimen & Side Effects (2)  Assessment: Axis I: Mood disorder NOS, ADHD combined type moderate severity  Axis II: Deferred  Axis III: Wears glasses, acne  Axis IV: Moderate  Axis V: 65   Plan: Continue Intuniv and Adderall XR. Discussed the need to graduate, get extra help in [place] as patient is struggling.  Discussed again patient's living situation  at length Also discussed in length again choices in regards to relationships and the need to not help or date minors. Patient currently on probation. Call when necessary Continue to see therapist regularly. Followup in 2  months  Nelly Rout, MD 02/13/2012

## 2012-03-06 ENCOUNTER — Ambulatory Visit (HOSPITAL_COMMUNITY): Payer: Self-pay | Admitting: Psychology

## 2012-03-28 ENCOUNTER — Other Ambulatory Visit (HOSPITAL_COMMUNITY): Payer: Self-pay | Admitting: Psychiatry

## 2012-04-16 ENCOUNTER — Encounter (HOSPITAL_COMMUNITY): Payer: Self-pay | Admitting: Psychiatry

## 2012-04-16 ENCOUNTER — Ambulatory Visit (INDEPENDENT_AMBULATORY_CARE_PROVIDER_SITE_OTHER): Payer: Medicaid Other | Admitting: Psychiatry

## 2012-04-16 VITALS — BP 118/72 | Ht 65.0 in | Wt 144.4 lb

## 2012-04-16 DIAGNOSIS — F909 Attention-deficit hyperactivity disorder, unspecified type: Secondary | ICD-10-CM

## 2012-04-16 DIAGNOSIS — F902 Attention-deficit hyperactivity disorder, combined type: Secondary | ICD-10-CM

## 2012-04-16 DIAGNOSIS — F39 Unspecified mood [affective] disorder: Secondary | ICD-10-CM

## 2012-04-16 MED ORDER — AMPHETAMINE-DEXTROAMPHET ER 30 MG PO CP24: 30.0000 mg | ORAL_CAPSULE | Freq: Every day | ORAL | Status: DC

## 2012-04-16 MED ORDER — GUANFACINE HCL ER 3 MG PO TB24: 3.0000 mg | ORAL_TABLET | Freq: Every day | ORAL | Status: DC

## 2012-04-16 MED ORDER — AMPHETAMINE-DEXTROAMPHET ER 30 MG PO CP24: 30.0000 mg | ORAL_CAPSULE | ORAL | Status: DC

## 2012-04-16 NOTE — Progress Notes (Signed)
Patient ID: [name], male   DOB: 10-21-1990, 21 y.o.   MRN: [id number]  [facility] [address] Progress Note  CAYDYN SPRUNG [id number] 21 y.o.  04/16/2012 10:03 AM  Chief Complaint: I have to take algebra 1 at [facility] [employer] for 2-3 weeks and then I graduate in December. I'm not living at the place I was living at as I am on probation and some of the people there were smoking marijuana. I'm living at my mom's house which is up for sale. Mom adds that they're looking at apartments and also plan to look at a boarding house. Patient says that he does no want to go to the boarding house but mom feels that it is best to keep all options open. She adds that there is also a restaurant close to the boarding house which has a job opening.     Patient denies any symptoms of mania, any problems with aggression, any side effects, any safety issuesat this visit. Mom agrees with patient but adds that he has a tunnel vision and he needs to look at all options rather than just one thing.   History of Present Illness: Suicidal Ideation: No Plan Formed: No Patient has means to carry out plan: No  Homicidal Ideation: No Plan Formed: No Patient has means to carry out plan: No  Review of Systems: Psychiatric: Agitation: No Hallucination: No Depressed Mood: No Insomnia: No Hypersomnia: No Altered Concentration: No Feels Worthless: No Grandiose Ideas: No Belief In Special Powers: No New/Increased Substance Abuse: No Compulsions: No  Neurologic: Headache: No Seizure: No Paresthesias: No  Past Medical Family, Social History: In 12th grade, patient will be attending classes for algebra one at [facility] [employer]. He is currently living in his mom's house by himself.  Outpatient Encounter Prescriptions as of 04/16/2012  Medication Sig Dispense Refill  . amphetamine-dextroamphetamine (ADDERALL XR) 30 MG 24 hr capsule Take 1 capsule (30 mg total) by mouth every morning.  30 capsule  0    . amphetamine-dextroamphetamine (ADDERALL XR) 30 MG 24 hr capsule Take 1 capsule (30 mg total) by mouth daily.  30 capsule  0  . GuanFACINE HCl 3 MG TB24 Take 1 tablet (3 mg total) by mouth daily.  30 tablet  2  . DISCONTD: amphetamine-dextroamphetamine (ADDERALL XR) 30 MG 24 hr capsule Take 1 capsule (30 mg total) by mouth daily.  30 capsule  0  . DISCONTD: amphetamine-dextroamphetamine (ADDERALL XR) 30 MG 24 hr capsule Take 1 capsule (30 mg total) by mouth every morning.  30 capsule  0  . DISCONTD: GuanFACINE HCl 3 MG TB24 Take 1 tablet (3 mg total) by mouth daily.  30 tablet  2    Past Psychiatric History/Hospitalization(s): Anxiety: No Bipolar Disorder: Yes Depression: Yes Mania: No Psychosis: No Schizophrenia: No Personality Disorder: No Hospitalization for psychiatric illness: Yes History of Electroconvulsive Shock Therapy: No Prior Suicide Attempts: No  Physical Exam: Constitutional:  BP 118/72  Ht 5\' 5"  (1.651 m)  Wt 144 lb 6.4 oz (65.499 kg)  BMI 24.03 kg/m2  General Appearance: alert, oriented, no acute distress and well nourished  Musculoskeletal: Strength & Muscle Tone: within normal limits Gait & Station: normal Patient leans: N/A  Psychiatric: Speech (describe rate, volume, coherence, spontaneity, and abnormalities if any): Normal in volume rate and tone spontaneous  Thought Process (describe rate, content, abstract reasoning, and computation): Organized, goal-directed, age-appropriate  Associations: Intact  Thoughts: normal  Mental Status: Orientation: oriented to person, place, time/date  and situation Mood & Affect: normal affect Attention Span & Concentration: OK  Medical Decision Making (Choose Three): Established Problem, Stable/Improving (1), Review of Psycho-Social Stressors (1), New Problem, with no additional work-up planned (3), Review of Last Therapy Session (1) and Review of Medication Regimen & Side Effects (2)  Assessment: Axis I: Mood  disorder NOS, ADHD combined type moderate severity  Axis II: Deferred  Axis III: Wears glasses, acne  Axis IV: Moderate  Axis V: 65   Plan: Continue Intuniv 3 mg daily and Adderall XR 30 mg daily for ADHD combined type Discussed the need to graduate, get help at [facility] [employer] as patient struggles with algebra 1   Discussed again patient's living situation at lengthalong with the need to have a job.  Patient currently on probation. Call when necessary Continue to see therapist regularly. Followup in one month   Toy Eisemann, MD 04/16/2012

## 2012-04-18 ENCOUNTER — Ambulatory Visit (HOSPITAL_COMMUNITY): Payer: Self-pay | Admitting: Psychology

## 2012-05-01 ENCOUNTER — Ambulatory Visit (INDEPENDENT_AMBULATORY_CARE_PROVIDER_SITE_OTHER): Payer: 59 | Admitting: Psychology

## 2012-05-01 ENCOUNTER — Encounter (HOSPITAL_COMMUNITY): Payer: Self-pay | Admitting: *Deleted

## 2012-05-01 DIAGNOSIS — F909 Attention-deficit hyperactivity disorder, unspecified type: Secondary | ICD-10-CM

## 2012-05-01 DIAGNOSIS — F39 Unspecified mood [affective] disorder: Secondary | ICD-10-CM

## 2012-05-02 ENCOUNTER — Encounter (HOSPITAL_COMMUNITY): Payer: Self-pay | Admitting: Psychology

## 2012-05-02 NOTE — Progress Notes (Signed)
Patient:  [name]   DOB: 20-Feb-1991  MR Number: [id number]  Location: BEHAVIORAL [facility] [address] [place] [address] Dept: [phone]  Start: 10 AM End: 11 AM  Provider/Observer:     Hershal Coria PSYD  Chief Complaint:      Chief Complaint  Patient presents with  . Agitation  . Other    Impulsive and hyperkinetic behaviors    Reason For Service:     The patient was referred by his treating psychiatrist Dr. Lucianne Muss, who felt that he needed to engage in some psychotherapeutic interventions and building some of his coping skills. The patient has been seen by Dr. Lucianne Muss through the [facility] mental health Department and then started seeing her here through [facility]. He is carried a number of diagnoses including conduct disorder with adolescent onset, attention deficit disorder with hyperactivity, and mood disorder NOS. He has been tried on a number of different medications including Concerta and Abilify as well as Depakote and Adderall. He does appear to be responding well to his current medications but there was a concern or interest in helping build more coping skills to do is reduced intellectual capacity and poor coping skills.  Interventions Strategy:  Cognitive/behavioral psychotherapy  Participation Level:   Active  Participation Quality:  Appropriate      Behavioral Observation:  Well Groomed, Alert, and Appropriate.   Current Psychosocial Factors: The patient is continued to struggle with impulsive types of behaviors. He has been able to move into a house and is on by his mother and is unsure how long this will last. However, after he moved his older than a soft into buying a dog for them that was half Rottweiler in later today documented taking possession of another one of these and they could not find a 4. The patient is really not capable financially or behaviorally  able to take care of these 2 dogs and this is an example of impulsive behaviors and his desire to have companionship but not really able to think things through. We were able to address this issue and he is planning on finding a home for one of these thoughts..  Content of Session:   Reviewed her symptoms and continue to work on building coping skills and strategies.  Current Status:   The patient is continuing to show improvement in his judgment and understanding of situations. The last visit he was more manic than he was this visit and it reflected in his unrealistic desires to try by his mother's house. There is no financial way that he could achieve this and is working a more realistic goals.  Patient Progress:   Very good  Target Goals:   Reduce impulsive and maladaptive behaviors and engage in better use of coping skills.  Last Reviewed:   05/02/2012  Goals Addressed Today:    We continue to work on target goals of reducing the impulsive and manipulative behaviors and better at implementation of coping skills for judgment and future planning.  Impression/Diagnosis:   The patient has been diagnosed with a number of psychiatric conditions including mood disorder, attention deficit disorder, conduct disorder and other variations of these conditions. I do think that the most appropriate diagnosis at this time is one of attention deficit disorder combined type with the continued rule out of a mood disorder. I have not seen the patient in any significant fluctuation in mood state since I started seeing him.  His mood is always been quite cheerful and pleasant with no indications of manic or hypomanic episodes or depressive episodes to this point.  Diagnosis:    Axis I:  1. Mood disorder   2. Unspecified hyperkinetic syndrome of childhood         Axis II: No diagnosis

## 2012-05-14 ENCOUNTER — Encounter (HOSPITAL_COMMUNITY): Payer: Self-pay | Admitting: Psychiatry

## 2012-05-14 ENCOUNTER — Ambulatory Visit (INDEPENDENT_AMBULATORY_CARE_PROVIDER_SITE_OTHER): Payer: MEDICAID | Admitting: Psychiatry

## 2012-05-14 VITALS — BP 122/70 | Ht 65.0 in | Wt 157.4 lb

## 2012-05-14 DIAGNOSIS — F909 Attention-deficit hyperactivity disorder, unspecified type: Secondary | ICD-10-CM

## 2012-05-14 DIAGNOSIS — F39 Unspecified mood [affective] disorder: Secondary | ICD-10-CM

## 2012-05-14 DIAGNOSIS — F902 Attention-deficit hyperactivity disorder, combined type: Secondary | ICD-10-CM

## 2012-05-14 MED ORDER — AMPHETAMINE-DEXTROAMPHET ER 30 MG PO CP24: 30.0000 mg | ORAL_CAPSULE | ORAL | Status: DC

## 2012-05-14 MED ORDER — AMPHETAMINE-DEXTROAMPHET ER 30 MG PO CP24: 30.0000 mg | ORAL_CAPSULE | Freq: Every day | ORAL | Status: DC

## 2012-05-14 MED ORDER — GUANFACINE HCL ER 3 MG PO TB24: 3.0000 mg | ORAL_TABLET | Freq: Every day | ORAL | Status: DC

## 2012-05-14 NOTE — Progress Notes (Signed)
Patient ID: [name], male   DOB: [birth year], 21 y.o.   MRN: [id number]  [facility] [address] Progress Note  [name] [id number] 21 y.o.  05/14/2012 11:19 AM  Chief Complaint: I am still at [facility] [employer], I been out of school for a week as I have scabies and I'm being treated for. I have been trying to stay out of trouble at the 20 year old goal has been calling me, has showed up at my place but I called the sheriff. Mom agrees with patient and adds that he is making better choices and states that she has told 20 year old that she's not allowed at her house.    Patient denies any symptoms of mania, any problems with aggression, any side effects, any safety issues at this visit.   History of Present Illness: Suicidal Ideation: No Plan Formed: No Patient has means to carry out plan: No  Homicidal Ideation: No Plan Formed: No Patient has means to carry out plan: No  Review of Systems: Psychiatric: Agitation: No Hallucination: No Depressed Mood: No Insomnia: No Hypersomnia: No Altered Concentration: No Feels Worthless: No Grandiose Ideas: No Belief In Special Powers: No New/Increased Substance Abuse: No Compulsions: No  Neurologic: Headache: No Seizure: No Paresthesias: No  Past Medical Family, Social History: In 12th grade, patient will be attending classes for algebra one at the [employer]. He is currently living in his mom's house by himself. Patient says that he's dating an 20 year old  Outpatient Encounter Prescriptions as of 05/14/2012  Medication Sig Dispense Refill  . amphetamine-dextroamphetamine (ADDERALL XR) 30 MG 24 hr capsule Take 1 capsule (30 mg total) by mouth daily.  30 capsule  0  . amphetamine-dextroamphetamine (ADDERALL XR) 30 MG 24 hr capsule Take 1 capsule (30 mg total) by mouth every morning.  30 capsule  0  . GuanFACINE HCl 3 MG TB24 Take 1 tablet (3 mg total) by mouth daily.  30 tablet  2  . DISCONTD:  amphetamine-dextroamphetamine (ADDERALL XR) 30 MG 24 hr capsule Take 1 capsule (30 mg total) by mouth every morning.  30 capsule  0  . DISCONTD: amphetamine-dextroamphetamine (ADDERALL XR) 30 MG 24 hr capsule Take 1 capsule (30 mg total) by mouth daily.  30 capsule  0  . DISCONTD: GuanFACINE HCl 3 MG TB24 Take 1 tablet (3 mg total) by mouth daily.  30 tablet  2    Past Psychiatric History/Hospitalization(s): Anxiety: No Bipolar Disorder: Yes Depression: Yes Mania: No Psychosis: No Schizophrenia: No Personality Disorder: No Hospitalization for psychiatric illness: Yes History of Electroconvulsive Shock Therapy: No Prior Suicide Attempts: No  Physical Exam: Constitutional:  BP 122/70  Ht 5\' 5"  (1.651 m)  Wt 157 lb 6.4 oz (71.396 kg)  BMI 26.19 kg/m2  General Appearance: alert, oriented, no acute distress and well nourished  Musculoskeletal: Strength & Muscle Tone: within normal limits Gait & Station: normal Patient leans: N/A  Psychiatric: Speech (describe rate, volume, coherence, spontaneity, and abnormalities if any): Normal in volume rate and tone spontaneous  Thought Process (describe rate, content, abstract reasoning, and computation): Organized, goal-directed, age-appropriate  Associations: Intact  Thoughts: normal  Mental Status: Orientation: oriented to person, place, time/date and situation Mood & Affect: normal affect Attention Span & Concentration: OK  Medical Decision Making (Choose Three): Established Problem, Stable/Improving (1), Review of Psycho-Social Stressors (1), New Problem, with no additional work-up planned (3), Review of Last Therapy Session (1) and Review of Medication Regimen & Side Effects (2)  Assessment: Axis I:  Mood disorder NOS, ADHD combined type moderate severity  Axis II: Deferred  Axis III: Wears glasses, acne, scabies  Axis IV: Moderate  Axis V: 65   Plan: Continue Intuniv 3 mg daily and Adderall XR 30 mg daily for ADHD  combined type Continue the treatment for scabies as prescribed by his primary care physician Continue classes at the [employer] Discussed the need to stay away from the 20 year old and to call the sheriff if she shows up at his house again Patient currently on probation. Call when necessary Continue to see therapist regularly. Followup in 3 months  Brent Rout, MD 05/14/2012

## 2012-05-20 NOTE — Progress Notes (Signed)
a 

## 2012-05-29 ENCOUNTER — Encounter (HOSPITAL_COMMUNITY): Payer: Self-pay | Admitting: *Deleted

## 2012-05-29 ENCOUNTER — Ambulatory Visit (INDEPENDENT_AMBULATORY_CARE_PROVIDER_SITE_OTHER): Payer: 59 | Admitting: Psychology

## 2012-05-29 ENCOUNTER — Encounter (HOSPITAL_COMMUNITY): Payer: Self-pay | Admitting: Psychology

## 2012-05-29 DIAGNOSIS — F909 Attention-deficit hyperactivity disorder, unspecified type: Secondary | ICD-10-CM

## 2012-05-29 DIAGNOSIS — F39 Unspecified mood [affective] disorder: Secondary | ICD-10-CM

## 2012-05-29 NOTE — Progress Notes (Signed)
Patient:  [name]   DOB: 1990/11/14  MR Number: [id number]  Location: BEHAVIORAL [facility] [address] [place] [address] Dept: [phone]  Start: 3 PM End: 4 PM  Provider/Observer:     Hershal Coria PSYD  Chief Complaint:      Chief Complaint  Patient presents with  . Agitation  . Stress  . ADD    Reason For Service:     The patient was referred by his treating psychiatrist Dr. Lucianne Muss, who felt that he needed to engage in some psychotherapeutic interventions and building some of his coping skills. The patient has been seen by Dr. Lucianne Muss through the [facility] mental health Department and then started seeing her here through [facility]. He is carried a number of diagnoses including conduct disorder with adolescent onset, attention deficit disorder with hyperactivity, and mood disorder NOS. He has been tried on a number of different medications including Concerta and Abilify as well as Depakote and Adderall. He does appear to be responding well to his current medications but there was a concern or interest in helping build more coping skills to do is reduced intellectual capacity and poor coping skills.  Interventions Strategy:  Cognitive/behavioral psychotherapy  Participation Level:   Active  Participation Quality:  Appropriate      Behavioral Observation:  Well Groomed, Alert, and Appropriate.   Current Psychosocial Factors: The patient comes in today and reports he is still having trouble with the under a curl that he helped out. He denies that the relationship never became a sexual one but he did help her out in several ways and she told him how she was having trouble with her mother who was abusing drugs. He got in a good bit of trouble and ended up going to jail at one point. He has tried his best to distance himself from this young lady but she continues to call his current  girlfriend and make comments on his current girlfriend's face look page. The patient reports he also is dealing with his impulsiveness regarding that as he now has 3 dogs and encourage him to find homes for the tube Rottweilers.  Content of Session:   Reviewed her symptoms and continue to work on building coping skills and strategies.  Current Status:   The patient has gotten a permanent place to stay and actually was able to talk his mother and letting him stay in the house that she is but he does not have any solid plans for when she wore it she is able to sell it.  Patient Progress:   Very good  Target Goals:   Reduce impulsive and maladaptive behaviors and engage in better use of coping skills.  Last Reviewed:   05/29/2012  Goals Addressed Today:    We continue to work on target goals of reducing the impulsive and manipulative behaviors and better at implementation of coping skills for judgment and future planning.  Impression/Diagnosis:   The patient has been diagnosed with a number of psychiatric conditions including mood disorder, attention deficit disorder, conduct disorder and other variations of these conditions. I do think that the most appropriate diagnosis at this time is one of attention deficit disorder combined type with the continued rule out of a mood disorder. I have not seen the patient in any significant fluctuation in mood state since I started seeing him. His mood is always been quite cheerful and pleasant with no indications of  manic or hypomanic episodes or depressive episodes to this point.  Diagnosis:    Axis I:  1. Mood disorder   2. Unspecified hyperkinetic syndrome of childhood         Axis II: No diagnosis

## 2012-06-30 ENCOUNTER — Ambulatory Visit (INDEPENDENT_AMBULATORY_CARE_PROVIDER_SITE_OTHER): Payer: MEDICAID | Admitting: Psychology

## 2012-06-30 ENCOUNTER — Ambulatory Visit (HOSPITAL_COMMUNITY): Payer: Self-pay | Admitting: Psychology

## 2012-06-30 DIAGNOSIS — F39 Unspecified mood [affective] disorder: Secondary | ICD-10-CM

## 2012-06-30 DIAGNOSIS — F909 Attention-deficit hyperactivity disorder, unspecified type: Secondary | ICD-10-CM

## 2012-07-10 ENCOUNTER — Encounter (HOSPITAL_COMMUNITY): Payer: Self-pay | Admitting: Psychology

## 2012-07-10 NOTE — Progress Notes (Signed)
Patient:  [name]   DOB: November 15, 1990  MR Number: [id number]  Location: BEHAVIORAL [facility] [address] [place] [address] Dept: [phone]  Start: 10 AM End: 11 AM  Provider/Observer:     Hershal Coria PSYD  Chief Complaint:      Chief Complaint  Patient presents with  . Anxiety  . Agitation  . Stress    Reason For Service:     The patient was referred by his treating psychiatrist Dr. Lucianne Muss, who felt that he needed to engage in some psychotherapeutic interventions and building some of his coping skills. The patient has been seen by Dr. Lucianne Muss through the [facility] mental health Department and then started seeing her here through [facility]. He is carried a number of diagnoses including conduct disorder with adolescent onset, attention deficit disorder with hyperactivity, and mood disorder NOS. He has been tried on a number of different medications including Concerta and Abilify as well as Depakote and Adderall. He does appear to be responding well to his current medications but there was a concern or interest in helping build more coping skills to do is reduced intellectual capacity and poor coping skills.  Interventions Strategy:  Cognitive/behavioral psychotherapy  Participation Level:   Active  Participation Quality:  Appropriate      Behavioral Observation:  Well Groomed, Alert, and Appropriate.   Current Psychosocial Factors: The patient reports that he has been quite agitated recently and reports that some of this has to do with the stress he is experiencing associated with him decided to go back to college. He is planning to go to [employer] or [employer] and has worked for for his own.  Content of Session:   Reviewed current symptoms and continue to work on building coping skills and strategies.  Current Status:   The patient  has gotten a permanent place to stay and actually was able to talk his mother and letting him stay in the house that she is but he does not have any solid plans for when she wore it she is able to sell it.  Patient Progress:   Very good  Target Goals:   Reduce impulsive and maladaptive behaviors and engage in better use of coping skills.  Last Reviewed:   06/30/2012  Goals Addressed Today:    We continue to work on target goals of reducing the impulsive and manipulative behaviors and better at implementation of coping skills for judgment and future planning.  Impression/Diagnosis:   The patient has been diagnosed with a number of psychiatric conditions including mood disorder, attention deficit disorder, conduct disorder and other variations of these conditions. I do think that the most appropriate diagnosis at this time is one of attention deficit disorder combined type with the continued rule out of a mood disorder. I have not seen the patient in any significant fluctuation in mood state since I started seeing him. His mood is always been quite cheerful and pleasant with no indications of manic or hypomanic episodes or depressive episodes to this point.  Diagnosis:    Axis I:  1. Mood disorder   2. Unspecified hyperkinetic syndrome of childhood         Axis II: No diagnosis

## 2012-07-24 ENCOUNTER — Ambulatory Visit (INDEPENDENT_AMBULATORY_CARE_PROVIDER_SITE_OTHER): Payer: 59 | Admitting: Psychology

## 2012-07-24 DIAGNOSIS — F39 Unspecified mood [affective] disorder: Secondary | ICD-10-CM

## 2012-07-30 ENCOUNTER — Encounter (HOSPITAL_COMMUNITY): Payer: Self-pay | Admitting: Psychology

## 2012-07-30 NOTE — Progress Notes (Signed)
Patient:  [name]   DOB: 1991-06-26  MR Number: [id number]  Location: BEHAVIORAL [facility] [address] [place] [address] Dept: [phone]  Start: 1 PM End: 2 PM  Provider/Observer:     Hershal Coria PSYD  Chief Complaint:      Chief Complaint  Patient presents with  . Agitation  . Stress    Reason For Service:     The patient was referred by his treating psychiatrist Dr. Lucianne Muss, who felt that he needed to engage in some psychotherapeutic interventions and building some of his coping skills. The patient has been seen by Dr. Lucianne Muss through the [facility] mental health Department and then started seeing her here through [facility]. He is carried a number of diagnoses including conduct disorder with adolescent onset, attention deficit disorder with hyperactivity, and mood disorder NOS. He has been tried on a number of different medications including Concerta and Abilify as well as Depakote and Adderall. He does appear to be responding well to his current medications but there was a concern or interest in helping build more coping skills to do is reduced intellectual capacity and poor coping skills.  Interventions Strategy:  Cognitive/behavioral psychotherapy  Participation Level:   Active  Participation Quality:  Appropriate      Behavioral Observation:  Well Groomed, Alert, and Appropriate.   Current Psychosocial Factors: The patient reports that he has not signed up for any college classes and is decided that he is going to try to get a job instead. He may sign up for one or 2 classes at most. The patient reports that one of the bigger stressors that he is dealing with his trying to see if it would be appropriate for him to take a powerful anti-acne medication. His dermatologist is seeking approval for and taking such medication as it can have effects of emotional stability. We address  this issue and we agreed that as long as he continues to be actively in contact with [place] during this trial that we could potentially try.  Content of Session:   Reviewed current symptoms and continue to work on building coping skills and strategies.  Current Status:   The patient has gotten a permanent place to stay and actually was able to talk his mother and letting him stay in the house that she is but he does not have any solid plans for when she wore it she is able to sell it.  Patient Progress:   Very good  Target Goals:   Reduce impulsive and maladaptive behaviors and engage in better use of coping skills.  Last Reviewed:   07/24/2012  Goals Addressed Today:    We continue to work on target goals of reducing the impulsive and manipulative behaviors and better at implementation of coping skills for judgment and future planning.  Impression/Diagnosis:   The patient has been diagnosed with a number of psychiatric conditions including mood disorder, attention deficit disorder, conduct disorder and other variations of these conditions. I do think that the most appropriate diagnosis at this time is one of attention deficit disorder combined type with the continued rule out of a mood disorder. I have not seen the patient in any significant fluctuation in mood state since I started seeing him. His mood is always been quite cheerful and pleasant with no indications of manic or hypomanic episodes or depressive episodes to this point.  Diagnosis:    Axis I:  1.  Mood disorder         Axis II: No diagnosis

## 2012-08-06 ENCOUNTER — Encounter (HOSPITAL_COMMUNITY): Payer: Self-pay | Admitting: Psychiatry

## 2012-08-06 ENCOUNTER — Ambulatory Visit (INDEPENDENT_AMBULATORY_CARE_PROVIDER_SITE_OTHER): Payer: Medicare Other | Admitting: Psychiatry

## 2012-08-06 ENCOUNTER — Ambulatory Visit (HOSPITAL_COMMUNITY): Payer: Self-pay | Admitting: Psychiatry

## 2012-08-06 VITALS — BP 124/84 | HR 68 | Ht 64.75 in | Wt 163.0 lb

## 2012-08-06 DIAGNOSIS — F429 Obsessive-compulsive disorder, unspecified: Secondary | ICD-10-CM | POA: Insufficient documentation

## 2012-08-06 DIAGNOSIS — F902 Attention-deficit hyperactivity disorder, combined type: Secondary | ICD-10-CM

## 2012-08-06 DIAGNOSIS — F39 Unspecified mood [affective] disorder: Secondary | ICD-10-CM

## 2012-08-06 DIAGNOSIS — F909 Attention-deficit hyperactivity disorder, unspecified type: Secondary | ICD-10-CM

## 2012-08-06 MED ORDER — GUANFACINE HCL ER 3 MG PO TB24: 3.0000 mg | ORAL_TABLET | Freq: Every day | ORAL | Status: DC

## 2012-08-06 MED ORDER — AMPHETAMINE-DEXTROAMPHET ER 30 MG PO CP24: 30.0000 mg | ORAL_CAPSULE | ORAL | Status: DC

## 2012-08-06 MED ORDER — AMPHETAMINE-DEXTROAMPHET ER 30 MG PO CP24: 30.0000 mg | ORAL_CAPSULE | Freq: Every day | ORAL | Status: DC

## 2012-08-06 NOTE — Patient Instructions (Addendum)
Temporal lobe difficulties is something that could be looked up on line.  Tegretol is the best medication for Temporal lobe difficulties.  Have a happy holiday.

## 2012-08-06 NOTE — Progress Notes (Signed)
[facility] Behavioral [facility] [address] Progress Note  [name] [id number] 21 y.o.  08/06/2012 1:30 PM  Chief Complaint: Chief Complaint  Patient presents with  . ADHD  . Follow-up  . Medication Refill   Subjective: "I have finished the [employer].  I got my diploma".   Patient comes with bio mother.  Pt reports that he is compliant with the psychotropic medications with considerable benefit and no noticible side effects.  He is able to focus enough to finish his school.   He has impulsive emotional outbursts.  He has mood instability, feelings of panic, aggression, headaches, and learning problems.  These are consistent with temporal lobe problems.    Patient denies any symptoms of mania, any problems with aggression, any side effects, any safety issues at this visit.   History of Present Illness: He has been on Ritalin, Concerta, Focalin.  He has also been on Depakote, Abilify.  Suicidal Ideation: No Plan Formed: No Patient has means to carry out plan: No  Homicidal Ideation: No Plan Formed: No Patient has means to carry out plan: No  Review of Systems: Psychiatric: Agitation: No Hallucination: No Depressed Mood: No Insomnia: No Hypersomnia: No Altered Concentration: No Feels Worthless: No Grandiose Ideas: No Belief In Special Powers: No New/Increased Substance Abuse: No Compulsions: No  Neurologic: Headache: No Seizure: No Paresthesias: No  Past Medical Family, Social History: cHe has finished his course work at the [employer]. He is currently living in his mom's house by himself. Patient says that he's dating an 20 year old  Outpatient Encounter Prescriptions as of 08/06/2012  Medication Sig Dispense Refill  . amphetamine-dextroamphetamine (ADDERALL XR) 30 MG 24 hr capsule Take 1 capsule (30 mg total) by mouth daily.  30 capsule  0  . amphetamine-dextroamphetamine (ADDERALL XR) 30 MG 24 hr capsule Take 1 capsule (30 mg total) by mouth every morning.  30  capsule  0  . GuanFACINE HCl 3 MG TB24 Take 1 tablet (3 mg total) by mouth daily.  30 tablet  2    Past Psychiatric History/Hospitalization(s): Anxiety: No Bipolar Disorder: Yes Depression: Yes Mania: No Psychosis: No Schizophrenia: No Personality Disorder: No Hospitalization for psychiatric illness: Yes History of Electroconvulsive Shock Therapy: No Prior Suicide Attempts: No  Physical Exam: Constitutional:  BP 124/84  Pulse 68  Ht 5' 4.75" (1.645 m)  Wt 163 lb (73.936 kg)  BMI 27.33 kg/m2  General Appearance: alert, oriented, no acute distress and well nourished  Musculoskeletal: Strength & Muscle Tone: within normal limits Gait & Station: normal Patient leans: N/A  Psychiatric: Speech (describe rate, volume, coherence, spontaneity, and abnormalities if any): Normal in volume rate and tone spontaneous  Thought Process (describe rate, content, abstract reasoning, and computation): Organized, goal-directed, age-appropriate  Associations: Intact  Thoughts: normal  Mental Status: Orientation: oriented to person, place, time/date and situation Mood & Affect: normal affect Attention Span & Concentration: OK  Medical Decision Making (Choose Three): Established Problem, Stable/Improving (1), Review of Psycho-Social Stressors (1), New Problem, with no additional work-up planned (3), Review of Last Therapy Session (1) and Review of Medication Regimen & Side Effects (2)  Assessment: Axis I: Mood disorder NOS, ADHD combined type moderate severity, rule out temporal lobe problems.   Axis II: Deferred  Axis III: Wears glasses, acne, scabies  Axis IV: Moderate  Axis V: 65   Plan:  I took his vitals.  I reviewed CC, tobacco/med/surg Hx, meds effects/ side effects, problem list, therapies and responses as well as current situation/symptoms  discussed options. See orders and pt instructions for more details. Consider trial with Tegretol in the future, when he is  comfortable with that idea. Continue Intuniv 3 mg daily and Adderall XR 30 mg daily for ADHD combined type Discussed the need to stay away from the 20 year old and to call the sheriff if she shows up at his house again Patient currently on probation. Call when necessary Continue to see therapist regularly. Followup in 3 months  Orson Aloe, MD 08/06/2012

## 2012-08-08 ENCOUNTER — Ambulatory Visit (INDEPENDENT_AMBULATORY_CARE_PROVIDER_SITE_OTHER): Payer: 59 | Admitting: Psychology

## 2012-08-08 DIAGNOSIS — F39 Unspecified mood [affective] disorder: Secondary | ICD-10-CM

## 2012-08-08 DIAGNOSIS — F909 Attention-deficit hyperactivity disorder, unspecified type: Secondary | ICD-10-CM

## 2012-08-11 ENCOUNTER — Encounter (HOSPITAL_COMMUNITY): Payer: Self-pay | Admitting: Psychology

## 2012-08-11 NOTE — Progress Notes (Signed)
Patient:  [name]   DOB: [birth year]  MR Number: [id number]  Location: BEHAVIORAL [facility] [address] [place] [address] Dept: [phone]  Start: 1 PM End: 2 PM  Provider/Observer:     Hershal Coria PSYD  Chief Complaint:      Chief Complaint  Patient presents with  . Agitation  . Stress  . Depression    Reason For Service:     The patient was referred by his treating psychiatrist Dr. Lucianne Muss, who felt that he needed to engage in some psychotherapeutic interventions and building some of his coping skills. The patient has been seen by Dr. Lucianne Muss through the [facility] mental health Department and then started seeing her here through [facility]. He is carried a number of diagnoses including conduct disorder with adolescent onset, attention deficit disorder with hyperactivity, and mood disorder NOS. He has been tried on a number of different medications including Concerta and Abilify as well as Depakote and Adderall. He does appear to be responding well to his current medications but there was a concern or interest in helping build more coping skills to do is reduced intellectual capacity and poor coping skills.  Interventions Strategy:  Cognitive/behavioral psychotherapy  Participation Level:   Active  Participation Quality:  Appropriate      Behavioral Observation:  Well Groomed, Alert, and Appropriate.   Current Psychosocial Factors: The patient reports that he was rejected for the trailer that he was trying to .  moving into. Individual on in the trailer claim that this was do to the fact that the patient had a job even though he had a steady income for his disability. However, the patient reports that he talked other people what sounds like he was refused to be able to move in to the situation because of the way he "looks"  Content of Session:   Reviewed current symptoms and  continue to work on building coping skills and strategies.  Current Status:   The patient has gotten a permanent place to stay and actually was able to talk his mother and letting him stay in the house that she is but he does not have any solid plans for when she wore it she is able to sell it.  Patient Progress:   Very good  Target Goals:   Reduce impulsive and maladaptive behaviors and engage in better use of coping skills.  Last Reviewed:   08/08/2012  Goals Addressed Today:    We continue to work on target goals of reducing the impulsive and manipulative behaviors and better at implementation of coping skills for judgment and future planning.  Impression/Diagnosis:   The patient has been diagnosed with a number of psychiatric conditions including mood disorder, attention deficit disorder, conduct disorder and other variations of these conditions. I do think that the most appropriate diagnosis at this time is one of attention deficit disorder combined type with the continued rule out of a mood disorder. I have not seen the patient in any significant fluctuation in mood state since I started seeing him. His mood is always been quite cheerful and pleasant with no indications of manic or hypomanic episodes or depressive episodes to this point.  Diagnosis:    Axis I:  1. Mood disorder   2. Unspecified hyperkinetic syndrome of childhood         Axis II: No diagnosis

## 2012-08-21 ENCOUNTER — Telehealth (HOSPITAL_COMMUNITY): Payer: Self-pay | Admitting: Psychiatry

## 2012-08-21 ENCOUNTER — Telehealth (HOSPITAL_COMMUNITY): Payer: Self-pay | Admitting: *Deleted

## 2012-08-21 NOTE — Telephone Encounter (Signed)
Phone message completed in the phone message section.  

## 2012-08-22 ENCOUNTER — Ambulatory Visit (INDEPENDENT_AMBULATORY_CARE_PROVIDER_SITE_OTHER): Payer: MEDICAID | Admitting: Psychiatry

## 2012-08-22 ENCOUNTER — Encounter (HOSPITAL_COMMUNITY): Payer: Self-pay | Admitting: Psychiatry

## 2012-08-22 DIAGNOSIS — F902 Attention-deficit hyperactivity disorder, combined type: Secondary | ICD-10-CM

## 2012-08-22 DIAGNOSIS — F39 Unspecified mood [affective] disorder: Secondary | ICD-10-CM

## 2012-08-22 DIAGNOSIS — F429 Obsessive-compulsive disorder, unspecified: Secondary | ICD-10-CM

## 2012-08-22 DIAGNOSIS — F909 Attention-deficit hyperactivity disorder, unspecified type: Secondary | ICD-10-CM

## 2012-08-22 MED ORDER — FLUOXETINE HCL 10 MG PO CAPS: 10.0000 mg | ORAL_CAPSULE | Freq: Every day | ORAL | Status: DC

## 2012-08-22 NOTE — Patient Instructions (Signed)
Take at least once a week, but really should take 3 times a week.  The focus part is achieved with daily use.  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  I have discussed with him and his mother the treatment options of adding something to help hedge against the depressive effects of Acutane.  He voices some understanding of that.  Further he is advised that he should take the Prozac at least once a week, but it would be better to take at least 3 times a week.  The focus benefit that he desires would be achieved by daily use.  The dose of Prozac can be increased to 40 mg a day. Please call if there are any questions.   Sincerely, Dorian Heckle. Dan Humphreys, MD, [place] [facility]

## 2012-08-22 NOTE — Progress Notes (Signed)
[facility] [address] Progress Note  [name] [id number] 22 y.o.  08/22/2012 2:12 PM  Chief Complaint: Chief Complaint  Patient presents with  . Depression  . Follow-up  . Medication Refill   Subjective: "[facility] and [facility]".   Patient comes with bio mother.  Pt reports that he is compliant with the psychotropic medications with considerable benefit and no noticible side effects.  Discussed the options of adding something so he won;t get depressed on Acutane.  Specifically discussed Ripserdal, Strattera, Luvox, and Prozac.  Had consulted with Ellison Hughs pharmacist at [facility] regarding these choices.  Pt does not like the weight gain prospects of Risperdal.  He had gotten violent on Starttera. The Prozac helps the focus better than the Luvox.  There are no known interactions with Luvox and Acutane.  Pt insists on staying on the Adderall and not adding any mood medications until his mood turns south.  He was cautioned about that.  He was given the information that he could take the Prozac at least once a week.  He agrees that he could take it if he didn't have to take it every day.   Patient denies any symptoms of mania, any problems with aggression, any side effects, any safety issues at this visit.   History of Present Illness: He has been on Ritalin, Concerta, Focalin.  He has also been on Depakote, Abilify as well as Strattera.  Suicidal Ideation: No Plan Formed: No Patient has means to carry out plan: No  Homicidal Ideation: No Plan Formed: No Patient has means to carry out plan: No  Review of Systems: Psychiatric: Agitation: No Hallucination: No Depressed Mood: No Insomnia: No Hypersomnia: No Altered Concentration: No Feels Worthless: No Grandiose Ideas: No Belief In Special Powers: No New/Increased Substance Abuse: No Compulsions: No  Neurologic: Headache: No Seizure: No Paresthesias: No  Past Medical Family,  Social History: cHe has finished his course work at the [employer]. He is currently living in his mom's house by himself. Patient says that he's dating an 21 year old  Outpatient Encounter Prescriptions as of 08/22/2012  Medication Sig Dispense Refill  . amphetamine-dextroamphetamine (ADDERALL XR) 30 MG 24 hr capsule Take 1 capsule (30 mg total) by mouth every morning.  30 capsule  0  . GuanFACINE HCl 3 MG TB24 Take 1 tablet (3 mg total) by mouth daily.  30 tablet  2  . amphetamine-dextroamphetamine (ADDERALL XR) 30 MG 24 hr capsule Take 1 capsule (30 mg total) by mouth daily.  30 capsule  0    Past Psychiatric History/Hospitalization(s): Anxiety: No Bipolar Disorder: Yes Depression: Yes Mania: No Psychosis: No Schizophrenia: No Personality Disorder: No Hospitalization for psychiatric illness: Yes History of Electroconvulsive Shock Therapy: No Prior Suicide Attempts: No  Physical Exam: Constitutional:  There were no vitals taken for this visit.  General Appearance: alert, oriented, no acute distress and well nourished  Musculoskeletal: Strength & Muscle Tone: within normal limits Gait & Station: normal Patient leans: N/A  Psychiatric: Speech (describe rate, volume, coherence, spontaneity, and abnormalities if any): Normal in volume rate and tone spontaneous  Thought Process (describe rate, content, abstract reasoning, and computation): Organized, goal-directed, age-appropriate  Associations: Intact  Thoughts: normal  Mental Status: Orientation: oriented to person, place, time/date and situation Mood & Affect: normal affect Attention Span & Concentration: OK  Medical Decision Making (Choose Three): Established Problem, Stable/Improving (1), Review of Psycho-Social Stressors (1), New Problem, with no  additional work-up planned (3), Review of Last Therapy Session (1) and Review of Medication Regimen & Side Effects (2)  Assessment: Axis I: Mood disorder NOS, ADHD combined  type moderate severity, rule out temporal lobe problems.   Axis II: Deferred  Axis III: Wears glasses, acne, Hx scabes  Axis IV: Moderate  Axis V: 65   Plan:  I reviewed CC, tobacco/med/surg Hx, meds effects/ side effects, problem list, therapies and responses as well as current situation/symptoms discussed options. See orders and pt instructions for more details. Consider trial with Tegretol in the future, when he is comfortable with that idea. Continue Intuniv 3 mg daily and Adderall XR 30 mg daily for ADHD combined type Try Prozac. Discussed the need to stay away from the 21 year old and to call the sheriff if she shows up at his house again Patient off probation and only has 7 more hours of community service left. Call when necessary Continue to see therapist regularly. Followup in 3 months  Orson Aloe, MD 08/22/2012

## 2012-08-25 ENCOUNTER — Ambulatory Visit (HOSPITAL_COMMUNITY): Payer: Self-pay | Admitting: Psychiatry

## 2012-08-27 ENCOUNTER — Telehealth (HOSPITAL_COMMUNITY): Payer: Self-pay | Admitting: Psychiatry

## 2012-08-27 NOTE — Telephone Encounter (Signed)
Discussed Fax request for refill for a CII.  S/W Marvis Repress who verified that a CII can not be refilled by fax.  Mentioned to him that the family has a script that can be refilled on 1/15.  He will call family and find out what is going on here.

## 2012-09-08 ENCOUNTER — Encounter (HOSPITAL_COMMUNITY): Payer: Self-pay | Admitting: Psychology

## 2012-09-08 ENCOUNTER — Ambulatory Visit (INDEPENDENT_AMBULATORY_CARE_PROVIDER_SITE_OTHER): Payer: MEDICAID | Admitting: Psychology

## 2012-09-08 DIAGNOSIS — F39 Unspecified mood [affective] disorder: Secondary | ICD-10-CM

## 2012-09-08 DIAGNOSIS — F909 Attention-deficit hyperactivity disorder, unspecified type: Secondary | ICD-10-CM

## 2012-09-08 NOTE — Progress Notes (Signed)
Patient:  [name]   DOB: 08/22/1990  MR Number: [id number]  Location: [facility] [address] [place] [address] Dept: [phone]  Start: 1 PM End: 2 PM  Provider/Observer:     Hershal Coria PSYD  Chief Complaint:      Chief Complaint  Patient presents with  . Agitation  . Depression  . Stress    Reason For Service:     The patient was referred by his treating psychiatrist Dr. Lucianne Muss, who felt that he needed to engage in some psychotherapeutic interventions and building some of his coping skills. The patient has been seen by Dr. Lucianne Muss through the [facility] mental health Department and then started seeing her here through [facility]. He is carried a number of diagnoses including conduct disorder with adolescent onset, attention deficit disorder with hyperactivity, and mood disorder NOS. He has been tried on a number of different medications including Concerta and Abilify as well as Depakote and Adderall. He does appear to be responding well to his current medications but there was a concern or interest in helping build more coping skills to do is reduced intellectual capacity and poor coping skills.  Interventions Strategy:  Cognitive/behavioral psychotherapy  Participation Level:   Active  Participation Quality:  Appropriate      Behavioral Observation:  Well Groomed, Alert, and Appropriate.   Current Psychosocial Factors: The patient reports that he has been able to find a place to live and has found a roommate from a past friend of his. He reports that the rent is $425 a month which he will be splitting in half with his other gentleman. The patient reports he has been worked up to stabilize his financial situation and reduce the amount of manipulation of various people can have on him particular round cell phones and been on his cell phone plan. The patient reports that  he has been working on his impulsivity and decision-making  Content of Session:   Reviewed current symptoms and continue to work on Producer, television/film/video and strategies.  Current Status:   The patient appears to of stabilized and found a permanent place to live which is a major curl for him. The patient reports that his symptoms of depression have been better as his life is become more stabilized.  Patient Progress:   Very good  Target Goals:   Reduce impulsive and maladaptive behaviors and engage in better use of coping skills.  Last Reviewed:   09/08/2012  Goals Addressed Today:    We continue to work on target goals of reducing the impulsive and manipulative behaviors and better at implementation of coping skills for judgment and future planning.  Impression/Diagnosis:   The patient has been diagnosed with a number of psychiatric conditions including mood disorder, attention deficit disorder, conduct disorder and other variations of these conditions. I do think that the most appropriate diagnosis at this time is one of attention deficit disorder combined type with the continued rule out of a mood disorder. I have not seen the patient in any significant fluctuation in mood state since I started seeing him. His mood is always been quite cheerful and pleasant with no indications of manic or hypomanic episodes or depressive episodes to this point.  Diagnosis:    Axis I:  1. Mood disorder   2. Unspecified hyperkinetic syndrome of childhood         Axis II: No diagnosis

## 2012-09-23 ENCOUNTER — Ambulatory Visit (INDEPENDENT_AMBULATORY_CARE_PROVIDER_SITE_OTHER): Payer: 59 | Admitting: Psychiatry

## 2012-09-23 ENCOUNTER — Encounter (HOSPITAL_COMMUNITY): Payer: Self-pay | Admitting: Psychiatry

## 2012-09-23 VITALS — Wt 168.2 lb

## 2012-09-23 DIAGNOSIS — F429 Obsessive-compulsive disorder, unspecified: Secondary | ICD-10-CM

## 2012-09-23 DIAGNOSIS — F39 Unspecified mood [affective] disorder: Secondary | ICD-10-CM

## 2012-09-23 DIAGNOSIS — F909 Attention-deficit hyperactivity disorder, unspecified type: Secondary | ICD-10-CM

## 2012-09-23 DIAGNOSIS — F902 Attention-deficit hyperactivity disorder, combined type: Secondary | ICD-10-CM

## 2012-09-23 MED ORDER — AMPHETAMINE-DEXTROAMPHET ER 30 MG PO CP24: 30.0000 mg | ORAL_CAPSULE | ORAL | Status: DC

## 2012-09-23 NOTE — Addendum Note (Signed)
Addended by: Mike Craze on: 09/23/2012 11:38 AM   Modules accepted: Orders

## 2012-09-23 NOTE — Progress Notes (Signed)
[facility] [address] Progress Note RUDOLPHO CLAXTON MRN: [id number] DOB: Aug 20, 1991 Age: 22 y.o.  Date: 09/23/2012 Start Time: 11:25 AM End Time: 11:40 AM  Chief Complaint: Chief Complaint  Patient presents with  . ADHD  . Anxiety  . Follow-up  . Medication Refill    Subjective: "[facility] and [facility] me on [facility]".  Depression 2/10 and Anxiety 0/10, where 1 is the best and 10 is the worst. Feeling a little sick physically.   Patient comes with bio mother.  Pt reports that he is compliant with the Adderall and Intuniv, ut not the Prozac yet with good benefit and no noticeable side effects.  He has been working on his truck until midnight and seems to be fighting off a cold or something.  Patient denies any symptoms of mania, any problems with aggression, any side effects, any safety issues at this visit.   History of Present Illness: He has been on Ritalin, Concerta, Focalin.  He has also been on Depakote, Abilify as well as Strattera.  Suicidal Ideation: No Plan Formed: No Patient has means to carry out plan: No  Homicidal Ideation: No Plan Formed: No Patient has means to carry out plan: No  Review of Systems: Psychiatric: Agitation: No Hallucination: No Depressed Mood: No Insomnia: No Hypersomnia: No Altered Concentration: No Feels Worthless: No Grandiose Ideas: No Belief In Special Powers: No New/Increased Substance Abuse: No Compulsions: No  Neurologic: Headache: No Seizure: No Paresthesias: No  Past Medical Family, Social History: cHe has finished his course work at the [employer]. He is currently living in his mom's house by himself. Patient says that he's dating an 21 year old.  Family History family history includes Alcohol abuse in his paternal aunt; Bipolar disorder in his cousin; and Drug abuse in his paternal aunt.  There is no history of Anxiety disorder, and Dementia, and Paranoid behavior, and Physical abuse, and  Schizophrenia, and Seizures, and Sexual abuse, .  Medications: Outpatient Encounter Prescriptions as of 09/23/2012  Medication Sig Dispense Refill  . amphetamine-dextroamphetamine (ADDERALL XR) 30 MG 24 hr capsule Take 1 capsule (30 mg total) by mouth every morning.  30 capsule  0  . amphetamine-dextroamphetamine (ADDERALL XR) 30 MG 24 hr capsule Take 1 capsule (30 mg total) by mouth daily.  30 capsule  0  . GuanFACINE HCl 3 MG TB24 Take 1 tablet (3 mg total) by mouth daily.  30 tablet  2  . FLUoxetine (PROZAC) 10 MG capsule Take 1 capsule (10 mg total) by mouth daily.  30 capsule  2    Past Psychiatric History/Hospitalization(s): Anxiety: No Bipolar Disorder: Yes Depression: Yes Mania: No Psychosis: No Schizophrenia: No Personality Disorder: No Hospitalization for psychiatric illness: Yes History of Electroconvulsive Shock Therapy: No Prior Suicide Attempts: No  Physical Exam: Constitutional:  Wt 168 lb 3.2 oz (76.295 kg)  General Appearance: alert, oriented, no acute distress and well nourished  Musculoskeletal: Strength & Muscle Tone: within normal limits Gait & Station: normal Patient leans: N/A  Psychiatric: Speech (describe rate, volume, coherence, spontaneity, and abnormalities if any): Normal in volume rate and tone spontaneous  Thought Process (describe rate, content, abstract reasoning, and computation): Organized, goal-directed, age-appropriate  Associations: Intact  Thoughts: normal  Mental Status: Orientation: oriented to person, place, time/date and situation Mood & Affect: normal affect Attention Span & Concentration: OK  Assessment: Axis I: Mood disorder NOS, ADHD combined type moderate severity, rule out temporal lobe problems.   Axis  II: Deferred  Axis III: Wears glasses, acne, Hx scabes  Axis IV: Moderate  Axis V: 65  Plan: I took his vitals.  I reviewed CC, tobacco/med/surg Hx, meds effects/ side effects, problem list, therapies and  responses as well as current situation/symptoms discussed options. See orders and pt instructions for more details.  Medical Decision Making Problem Points:  Established problem, stable/improving (1), Review of last therapy session (1), Review of psycho-social stressors (1) and Self-limited or minor (1) Data Points:  Review of medication regiment & side effects (2)  I certify that outpatient services furnished can reasonably be expected to improve the patient's condition.   Orson Aloe, MD, [facility]

## 2012-09-23 NOTE — Patient Instructions (Signed)
Call dermatologist about starting the Acutane.  Get on the Prozac first.  Call if problems or concerns.

## 2012-10-20 ENCOUNTER — Ambulatory Visit (HOSPITAL_COMMUNITY): Payer: Self-pay | Admitting: Psychology

## 2012-10-22 ENCOUNTER — Encounter (HOSPITAL_COMMUNITY): Payer: Self-pay | Admitting: Psychology

## 2012-10-22 ENCOUNTER — Ambulatory Visit (INDEPENDENT_AMBULATORY_CARE_PROVIDER_SITE_OTHER): Payer: 59 | Admitting: Psychology

## 2012-10-22 DIAGNOSIS — F39 Unspecified mood [affective] disorder: Secondary | ICD-10-CM

## 2012-10-22 DIAGNOSIS — F909 Attention-deficit hyperactivity disorder, unspecified type: Secondary | ICD-10-CM

## 2012-10-22 NOTE — Progress Notes (Signed)
Patient:  [name]   DOB: Jan 30, 1991  MR Number: [id number]  Location: [facility] [address] [place] [address] Dept: [phone]  Start: 11 AM End: 12 PM  Provider/Observer:     Hershal Coria PSYD  Chief Complaint:      Chief Complaint  Patient presents with  . Anxiety  . Agitation  . Stress    Reason For Service:     The patient was referred by his treating psychiatrist Dr. Lucianne Muss, who felt that he needed to engage in some psychotherapeutic interventions and building some of his coping skills. The patient has been seen by Dr. Lucianne Muss through the [facility] mental health Department and then started seeing her here through [facility]. He is carried a number of diagnoses including conduct disorder with adolescent onset, attention deficit disorder with hyperactivity, and mood disorder NOS. He has been tried on a number of different medications including Concerta and Abilify as well as Depakote and Adderall. He does appear to be responding well to his current medications but there was a concern or interest in helping build more coping skills to do is reduced intellectual capacity and poor coping skills.  Interventions Strategy:  Cognitive/behavioral psychotherapy  Participation Level:   Active  Participation Quality:  Appropriate      Behavioral Observation:  Well Groomed, Alert, and Appropriate.   Current Psychosocial Factors: The patient reports that there've been major stressors in his life recently. His father, who has been battling terminal cancer for some time, passed away on 03-06-24and has been extremely difficult on both he as well as his family. There were many family members that he cares about continued to be quite ended up on the patient's father and he is very worried about them and how they're managed. The patient has been able to develop his independence over  the past many months and is going to be able to manage his life but he is very concerned about others. On top of that, he recently adopted a new dog from the shelter where he is volunteering and after about 2 weeks the dog got away from his restraint and was hit by a car. On the dog did not die immediately there were clearly some injuries to his head and face area but the dog ran off and fear and the patient has not been able to find the dog since then.  Content of Session:   Reviewed current symptoms and continue to work on building coping skills and strategies.  Current Status:   The patient is struggling right now with the recent death of his father. However, given the patient's past history he is actually managing the situation much better than he would have in the past. He is utilizing his coping skills and continuing to get his life stabilized. He has gotten a place to live that he can afford it has 2 roommates or make it even more affordable for him. However, as I had suggested he had a situation where he wasn't dependent upon these other people to be able to make ends meet but having these 2 other individuals living there we'll make it even more manageable financially but if something doesn't work out with them he will not be in a manageable situation.  Patient Progress:   Very good  Target Goals:   Reduce impulsive and maladaptive behaviors and engage in better use of coping skills.  Last Reviewed:  10/22/2012  Goals Addressed Today:    We continue to work on target goals of reducing the impulsive and manipulative behaviors and better at implementation of coping skills for judgment and future planning.  Impression/Diagnosis:   The patient has been diagnosed with a number of psychiatric conditions including mood disorder, attention deficit disorder, conduct disorder and other variations of these conditions. I do think that the most appropriate diagnosis at this time is one of attention deficit  disorder combined type with the continued rule out of a mood disorder. I have not seen the patient in any significant fluctuation in mood state since I started seeing him. His mood is always been quite cheerful and pleasant with no indications of manic or hypomanic episodes or depressive episodes to this point.  Diagnosis:    Axis I:  Mood disorder  Attention deficit disorder with hyperactivity      Axis II: No diagnosis

## 2012-11-04 ENCOUNTER — Ambulatory Visit (HOSPITAL_COMMUNITY): Payer: Self-pay | Admitting: Psychiatry

## 2012-11-17 ENCOUNTER — Ambulatory Visit (HOSPITAL_COMMUNITY): Payer: Self-pay | Admitting: Psychiatry

## 2012-11-18 ENCOUNTER — Ambulatory Visit (HOSPITAL_COMMUNITY): Payer: Self-pay | Admitting: Psychiatry

## 2012-11-19 ENCOUNTER — Ambulatory Visit (HOSPITAL_COMMUNITY): Payer: Self-pay | Admitting: Psychology

## 2012-11-20 ENCOUNTER — Encounter (HOSPITAL_COMMUNITY): Payer: Self-pay | Admitting: Psychiatry

## 2012-11-20 ENCOUNTER — Ambulatory Visit (INDEPENDENT_AMBULATORY_CARE_PROVIDER_SITE_OTHER): Payer: Medicare Other | Admitting: Psychiatry

## 2012-11-20 VITALS — Wt 170.0 lb

## 2012-11-20 DIAGNOSIS — F909 Attention-deficit hyperactivity disorder, unspecified type: Secondary | ICD-10-CM

## 2012-11-20 DIAGNOSIS — F902 Attention-deficit hyperactivity disorder, combined type: Secondary | ICD-10-CM

## 2012-11-20 DIAGNOSIS — F39 Unspecified mood [affective] disorder: Secondary | ICD-10-CM

## 2012-11-20 DIAGNOSIS — F429 Obsessive-compulsive disorder, unspecified: Secondary | ICD-10-CM

## 2012-11-20 MED ORDER — AMPHETAMINE-DEXTROAMPHET ER 30 MG PO CP24: 30.0000 mg | ORAL_CAPSULE | ORAL | Status: DC

## 2012-11-20 MED ORDER — GUANFACINE HCL ER 3 MG PO TB24: 3.0000 mg | ORAL_TABLET | Freq: Every day | ORAL | Status: DC

## 2012-11-20 MED ORDER — FLUOXETINE HCL 10 MG PO CAPS: 10.0000 mg | ORAL_CAPSULE | Freq: Every day | ORAL | Status: DC

## 2012-11-20 MED ORDER — AMPHETAMINE-DEXTROAMPHET ER 30 MG PO CP24: 30.0000 mg | ORAL_CAPSULE | Freq: Every day | ORAL | Status: DC

## 2012-11-20 NOTE — Patient Instructions (Signed)
Get started on the Prozac to help with the OCD.  Take care of yourself.  No one else is standing up to do the job and only you know what you need.   GET SERIOUS about taking care of yourself.  Do the next right thing and that often means doing something to care for yourself along the lines of are you hungry, are you angry, are you lonely, are you tired, are you scared?  [employer] is what that stands for.  Call if problems or concerns.

## 2012-11-20 NOTE — Progress Notes (Signed)
[facility] [address] Progress Note SALEEM COCCIA MRN: [id number] DOB: 03/22/1991 Age: 22 y.o.  Date: 11/20/2012 Start Time: 12:10 PM End Time: 12:33 PM  Chief Complaint: Chief Complaint  Patient presents with  . Anxiety  . Depression  . Follow-up  . Medication Refill    Subjective: "My dad passed away last month.".  Depression 3/10 and Anxiety 1/10, where 1 is the best and 10 is the worst. Pain is 2/10 stomach growling from hunger  Patient comes with bio mother.  Pt reports that he is compliant with the Adderall and Intuniv, ut not the Prozac yet with good benefit and no noticeable side effects.  He has not been eating much.  His father died last month and he has gone back to smoking a cigarette once every other day or so. He is coughing a lot in the office.  He needs to set limits with his roommate to get the food stamps like he is supposed to.  History of Present Illness: He has been on Ritalin, Concerta, Focalin.  He has also been on Depakote, Abilify as well as Strattera.  Suicidal Ideation: No Plan Formed: No Patient has means to carry out plan: No  Homicidal Ideation: No Plan Formed: No Patient has means to carry out plan: No  Review of Systems: Psychiatric: Agitation: No Hallucination: No Depressed Mood: No Insomnia: No Hypersomnia: No Altered Concentration: No Feels Worthless: No Grandiose Ideas: No Belief In Special Powers: No New/Increased Substance Abuse: No Compulsions: No  Neurologic: Headache: No Seizure: No Paresthesias: No  Past Medical Family, Social History: cHe has finished his course work at the [employer]. He is currently living in his mom's house by himself. Patient says that he's dating an 21 year old.  Family History family history includes Alcohol abuse in his paternal aunt; Bipolar disorder in his cousin; and Drug abuse in his paternal aunt.  There is no history of Anxiety disorder, and Dementia, and Paranoid behavior, and  Physical abuse, and Schizophrenia, and Seizures, and Sexual abuse, .  Medications: Outpatient Encounter Prescriptions as of 11/20/2012  Medication Sig Dispense Refill  . amphetamine-dextroamphetamine (ADDERALL XR) 30 MG 24 hr capsule Take 1 capsule (30 mg total) by mouth daily.  30 capsule  0  . amphetamine-dextroamphetamine (ADDERALL XR) 30 MG 24 hr capsule Take 1 capsule (30 mg total) by mouth every morning.  30 capsule  0  . GuanFACINE HCl 3 MG TB24 Take 1 tablet (3 mg total) by mouth daily.  30 tablet  2  . FLUoxetine (PROZAC) 10 MG capsule Take 1 capsule (10 mg total) by mouth daily.  30 capsule  2   No facility-administered encounter medications on file as of 11/20/2012.    Past Psychiatric History/Hospitalization(s): Anxiety: No Bipolar Disorder: Yes Depression: Yes Mania: No Psychosis: No Schizophrenia: No Personality Disorder: No Hospitalization for psychiatric illness: Yes History of Electroconvulsive Shock Therapy: No Prior Suicide Attempts: No  Physical Exam: Constitutional:  Wt 170 lb (77.111 kg)  BMI 28.5 kg/m2  General Appearance: alert, oriented, no acute distress and well nourished  Musculoskeletal: Strength & Muscle Tone: within normal limits Gait & Station: normal Patient leans: N/A  Psychiatric: Speech (describe rate, volume, coherence, spontaneity, and abnormalities if any): Normal in volume rate and tone spontaneous  Thought Process (describe rate, content, abstract reasoning, and computation): Organized, goal-directed, age-appropriate  Associations: Intact  Thoughts: normal  Mental Status: Orientation: oriented to person, place, time/date and situation Mood & Affect: normal affect Attention Span & Concentration: OK  Lab Results: No results found for this or any previous visit (from the past 8736 hour(s)). PCP draws routine labs and nothing is emerging as of concern.  Assessment: Axis I: Mood disorder NOS, ADHD combined type moderate severity,  rule out temporal lobe problems.   Axis II: Deferred  Axis III: Wears glasses, acne, Hx scabes  Axis IV: Moderate  Axis V: 65  Plan: I took his vitals.  I reviewed CC, tobacco/med/surg Hx, meds effects/ side effects, problem list, therapies and responses as well as current situation/symptoms discussed options. See orders and pt instructions for more details.  MEDICATIONS this encounter: Meds ordered this encounter  Medications  . GuanFACINE HCl 3 MG TB24    Sig: Take 1 tablet (3 mg total) by mouth daily.    Dispense:  30 tablet    Refill:  2  . FLUoxetine (PROZAC) 10 MG capsule    Sig: Take 1 capsule (10 mg total) by mouth daily.    Dispense:  30 capsule    Refill:  2  . amphetamine-dextroamphetamine (ADDERALL XR) 30 MG 24 hr capsule    Sig: Take 1 capsule (30 mg total) by mouth every morning.    Dispense:  30 capsule    Refill:  0    This is a one month supply DO NOT REFILL EARLY, do not fill earlier than 12/18/2012 or later than 01/18/2013  . amphetamine-dextroamphetamine (ADDERALL XR) 30 MG 24 hr capsule    Sig: Take 1 capsule (30 mg total) by mouth daily.    Dispense:  30 capsule    Refill:  0    This is a one month supply DO NOT REFILL EARLY, do not fill later than 12/18/2012   Medical Decision Making Problem Points:  Established problem, stable/improving (1), Review of last therapy session (1), Review of psycho-social stressors (1) and Self-limited or minor (1) Data Points:  Review or order clinical lab tests (1) Review of medication regiment & side effects (2)  I certify that outpatient services furnished can reasonably be expected to improve the patient's condition.   Orson Aloe, MD, [facility]

## 2012-11-21 ENCOUNTER — Ambulatory Visit (HOSPITAL_COMMUNITY): Payer: Self-pay | Admitting: Psychology

## 2012-11-25 ENCOUNTER — Telehealth (HOSPITAL_COMMUNITY): Payer: Self-pay | Admitting: Psychiatry

## 2012-11-25 NOTE — Telephone Encounter (Signed)
S/Wpt and he agrees to start the Prozac 2 weeks before the appointment.  Actually he should have already for his OCD.

## 2012-12-17 ENCOUNTER — Ambulatory Visit (INDEPENDENT_AMBULATORY_CARE_PROVIDER_SITE_OTHER): Payer: 59 | Admitting: Psychology

## 2012-12-17 ENCOUNTER — Telehealth (HOSPITAL_COMMUNITY): Payer: Self-pay | Admitting: Psychiatry

## 2012-12-17 DIAGNOSIS — F39 Unspecified mood [affective] disorder: Secondary | ICD-10-CM

## 2012-12-17 DIAGNOSIS — F909 Attention-deficit hyperactivity disorder, unspecified type: Secondary | ICD-10-CM

## 2012-12-17 NOTE — Telephone Encounter (Signed)
Pt unable to afford Adderall and Intuniv without his Medicaid coming through.  This is noted. There are no sources for samples of either of these.

## 2012-12-18 ENCOUNTER — Encounter (HOSPITAL_COMMUNITY): Payer: Self-pay | Admitting: Psychology

## 2012-12-18 NOTE — Progress Notes (Signed)
Patient:  [name]   DOB: 17-Aug-1991  MR Number: [id number]  Location: BEHAVIORAL [facility] [address] [place] [address] Dept: [phone]  Start: 1 PM End: 2 PM  Provider/Observer:     Hershal Coria PSYD  Chief Complaint:      Chief Complaint  Patient presents with  . Anxiety  . Depression    Reason For Service:     The patient was referred by his treating psychiatrist Dr. Lucianne Muss, who felt that he needed to engage in some psychotherapeutic interventions and building some of his coping skills. The patient has been seen by Dr. Lucianne Muss through the [facility] mental health Department and then started seeing her here through [facility]. He is carried a number of diagnoses including conduct disorder with adolescent onset, attention deficit disorder with hyperactivity, and mood disorder NOS. He has been tried on a number of different medications including Concerta and Abilify as well as Depakote and Adderall. He does appear to be responding well to his current medications but there was a concern or interest in helping build more coping skills to do is reduced intellectual capacity and poor coping skills.  Interventions Strategy:  Cognitive/behavioral psychotherapy  Participation Level:   Active  Participation Quality:  Appropriate      Behavioral Observation:  Well Groomed, Alert, and Appropriate.   Current Psychosocial Factors: Concern about the patient's current living situation is he has got him a place to live and now has 4 other people living in the house with him. He does not like some of them at least for pain part of their expenses. The patient continually puts them in situations where people can take advantage of him. The patient also began interacting with the under aged girl that he got him so much trouble with in the past. She is now 39. The patient reports that he talk to her  parents prior to starting interact with her and they said it would be okay for her to interact with him through calls, text messages and in person as well as of the sexual activity. The patient is on at this and has not engaged in any physical or inappropriate romantic activity. However, discussing this young lady it sounds increasingly possible that she has more borderline personality disorder and I'm very concerned about the interaction between the patient's difficulties in the order lying personality symptoms.  Content of Session:   Reviewed current symptoms and continue to work on building coping skills and strategies.  Current Status:   The patient is struggling right now with the recent death of his father. However, given the patient's past history he is actually managing the situation much better than he would have in the past. He is utilizing his coping skills and continuing to get his life stabilized. He has gotten a place to live that he can afford it has 2 roommates or make it even more affordable for him. However, as I had suggested he had a situation where he wasn't dependent upon these other people to be able to make ends meet but having these 2 other individuals living there we'll make it even more manageable financially but if something doesn't work out with them he will not be in a manageable situation.  Patient Progress:   Very good  Target Goals:   Reduce impulsive and maladaptive behaviors and engage in better use of coping skills.  Last Reviewed:   12/17/2012  Goals  Addressed Today:    We continue to work on target goals of reducing the impulsive and manipulative behaviors and better at implementation of coping skills for judgment and future planning.  Impression/Diagnosis:   The patient has been diagnosed with a number of psychiatric conditions including mood disorder, attention deficit disorder, conduct disorder and other variations of these conditions. I do think that the most  appropriate diagnosis at this time is one of attention deficit disorder combined type with the continued rule out of a mood disorder. I have not seen the patient in any significant fluctuation in mood state since I started seeing him. His mood is always been quite cheerful and pleasant with no indications of manic or hypomanic episodes or depressive episodes to this point.  Diagnosis:    Axis I:  Mood disorder  Attention deficit disorder with hyperactivity      Axis II: No diagnosis

## 2013-01-16 ENCOUNTER — Ambulatory Visit (INDEPENDENT_AMBULATORY_CARE_PROVIDER_SITE_OTHER): Payer: Medicare Other | Admitting: Psychiatry

## 2013-01-16 ENCOUNTER — Encounter (HOSPITAL_COMMUNITY): Payer: Self-pay | Admitting: Psychiatry

## 2013-01-16 VITALS — BP 125/76 | Ht 65.5 in | Wt 180.6 lb

## 2013-01-16 DIAGNOSIS — F902 Attention-deficit hyperactivity disorder, combined type: Secondary | ICD-10-CM

## 2013-01-16 DIAGNOSIS — F909 Attention-deficit hyperactivity disorder, unspecified type: Secondary | ICD-10-CM

## 2013-01-16 DIAGNOSIS — F39 Unspecified mood [affective] disorder: Secondary | ICD-10-CM

## 2013-01-16 DIAGNOSIS — F429 Obsessive-compulsive disorder, unspecified: Secondary | ICD-10-CM

## 2013-01-16 MED ORDER — AMPHETAMINE-DEXTROAMPHET ER 30 MG PO CP24: 30.0000 mg | ORAL_CAPSULE | Freq: Every day | ORAL | Status: DC

## 2013-01-16 MED ORDER — GUANFACINE HCL ER 3 MG PO TB24: 3.0000 mg | ORAL_TABLET | Freq: Every day | ORAL | Status: DC

## 2013-01-16 MED ORDER — AMPHETAMINE-DEXTROAMPHET ER 30 MG PO CP24: 30.0000 mg | ORAL_CAPSULE | ORAL | Status: DC

## 2013-01-16 NOTE — Patient Instructions (Signed)
Keep it up.  Stay off tobacco.  Set a timer for 8 or a certain number minutes and walk for that amount of time in the house or in the yard.  Mark the number of minutes on a calendar for that day.  Do that every day this week.  Then next week increase the time by 1 minutes and then mark the calendar with the number of minutes for that day.  Each week increase your exercise by one minute.  Keep a record of this so you can see the progress you are making.  Do this every day, just like eating and sleeping.  It is good for pain control, depression, and for your soul/spirit.  Bring the record in for your next visit so we can talk about your effort and how you feel with the new exercise program going and working for you.  Relaxation is the ultimate solution for you.  You can seek it through tub baths, bubble baths, essential oils or incense, walking or chatting with friends, listening to soft music, watching a candle burn and just letting all thoughts go and appreciating the true essence of the Creator.  Pets or animals may be very helpful.  You might spend some time with them and then go do more directed meditation.  "I am Wishes Fulfilled Meditation" by Marylene Buerger and Lyndal Pulley may be helpful MUSIC for getting to sleep or for meditating You can order it from on line.  You might find the Chill channel on Pandora and explore the artists that you like better. Take care of yourself.  No one else is standing up to do the job and only you know what you need.   GET SERIOUS about taking care of yourself.  Do the next right thing and that often means doing something to care for yourself along the lines of are you hungry, are you angry, are you lonely, are you tired, are you scared?  HALTS is what that stands for.  Call if problems or concerns.

## 2013-01-16 NOTE — Progress Notes (Signed)
[facility] [address] Progress Note [name] MRN: [id number] DOB: Nov 22, 1990 Age: 22 y.o.  Date: 01/16/2013 Start Time: 2:55 PM End Time: 3:08 PM  Chief Complaint: Chief Complaint  Patient presents with  . ADHD  . Anxiety  . Follow-up  . Medication Refill    Subjective: "My dad passed away last month.".  Depression 0/10 and Anxiety 4/10, where 1 is the best and 10 is the worst. Pain is 0/10  Patient comes with bio mother.  Pt reports that he is compliant with the Adderall and Intuniv with good benefit and no noticeable side effects.  He is actually out of Aderall this past month because his Medicare Part D card has not come yet.  History of Present Illness: He has been on Ritalin, Concerta, Focalin.  He has also been on Depakote, Abilify as well as Strattera. Allergies: Allergies  Allergen Reactions  . Strattera (Atomoxetine Hcl) Other (See Comments)    Became violent on Strattera.  . Sulfonamide Derivatives Rash   Medical History: Past Medical History  Diagnosis Date  . ADHD (attention deficit hyperactivity disorder)   . Unspecified episodic mood disorder   . Acne   . Obsessive-compulsive disorder   . Oppositional defiant disorder    Surgical History: Past Surgical History  Procedure Laterality Date  . Tendon lengthening      leg tendon stretch as child  . Tendon lengthening      age 65   Family History: family history includes Alcohol abuse in his paternal aunt; Bipolar disorder in his cousin; and Drug abuse in his paternal aunt.  There is no history of Anxiety disorder, and Dementia, and Paranoid behavior, and Physical abuse, and Schizophrenia, and Seizures, and Sexual abuse, . Reviewed today in the office and nothing has changed.  Suicidal Ideation: No Plan Formed: No Patient has means to carry out plan: No  Homicidal Ideation: No Plan Formed: No Patient has means to carry out plan: No  Review of Systems: Psychiatric: Agitation:  No Hallucination: No Depressed Mood: No Insomnia: No Hypersomnia: No Altered Concentration: No Feels Worthless: No Grandiose Ideas: No Belief In Special Powers: No New/Increased Substance Abuse: No Compulsions: No  Neurologic: Headache: No Seizure: No Paresthesias: No  Past Medical Family, Social History: cHe has finished his course work at the [employer]. He is currently living in his mom's house by himself. Patient says that he's dating an 21 year old.  Family History family history includes Alcohol abuse in his paternal aunt; Bipolar disorder in his cousin; and Drug abuse in his paternal aunt.  There is no history of Anxiety disorder, and Dementia, and Paranoid behavior, and Physical abuse, and Schizophrenia, and Seizures, and Sexual abuse, .  Medications: Outpatient Encounter Prescriptions as of 01/16/2013  Medication Sig Dispense Refill  . amphetamine-dextroamphetamine (ADDERALL XR) 30 MG 24 hr capsule Take 1 capsule (30 mg total) by mouth daily.  30 capsule  0  . amphetamine-dextroamphetamine (ADDERALL XR) 30 MG 24 hr capsule Take 1 capsule (30 mg total) by mouth every morning.  30 capsule  0  . GuanFACINE HCl 3 MG TB24 Take 1 tablet (3 mg total) by mouth daily.  30 tablet  2  . [DISCONTINUED] amphetamine-dextroamphetamine (ADDERALL XR) 30 MG 24 hr capsule Take 1 capsule (30 mg total) by mouth every morning.  30 capsule  0  . [DISCONTINUED] amphetamine-dextroamphetamine (ADDERALL XR) 30 MG 24 hr capsule Take 1 capsule (30 mg total) by mouth daily.  30 capsule  0  . [DISCONTINUED]  GuanFACINE HCl 3 MG TB24 Take 1 tablet (3 mg total) by mouth daily.  30 tablet  2  . amphetamine-dextroamphetamine (ADDERALL XR) 30 MG 24 hr capsule Take 1 capsule (30 mg total) by mouth daily.  30 capsule  0  . FLUoxetine (PROZAC) 10 MG capsule Take 1 capsule (10 mg total) by mouth daily.  30 capsule  2   No facility-administered encounter medications on file as of 01/16/2013.    Past Psychiatric  History/Hospitalization(s): Anxiety: No Bipolar Disorder: Yes Depression: Yes Mania: No Psychosis: No Schizophrenia: No Personality Disorder: No Hospitalization for psychiatric illness: Yes History of Electroconvulsive Shock Therapy: No Prior Suicide Attempts: No  Physical Exam: Constitutional:  BP 125/76  Ht 5' 5.5" (1.664 m)  Wt 180 lb 9.6 oz (81.92 kg)  BMI 29.59 kg/m2  General Appearance: alert, oriented, no acute distress and well nourished  Musculoskeletal: Strength & Muscle Tone: within normal limits Gait & Station: normal Patient leans: N/A  Psychiatric: Speech (describe rate, volume, coherence, spontaneity, and abnormalities if any): Normal in volume rate and tone spontaneous  Thought Process (describe rate, content, abstract reasoning, and computation): Organized, goal-directed, age-appropriate  Associations: Intact  Thoughts: normal  Mental Status: Orientation: oriented to person, place, time/date and situation Mood & Affect: normal affect Attention Span & Concentration: OK  Lab Results: No results found for this or any previous visit (from the past 8736 hour(s)). PCP draws routine labs and nothing is emerging as of concern.  Assessment: Axis I: Mood disorder NOS, ADHD combined type moderate severity, rule out temporal lobe problems.   Axis II: Deferred  Axis III: Wears glasses, acne, Hx scabes  Axis IV: Moderate  Axis V: 65  Plan: I took his vitals.  I reviewed CC, tobacco/med/surg Hx, meds effects/ side effects, problem list, therapies and responses as well as current situation/symptoms discussed options. Continue current effective medications. See orders and pt instructions for more details.  MEDICATIONS this encounter: Meds ordered this encounter  Medications  . GuanFACINE HCl 3 MG TB24    Sig: Take 1 tablet (3 mg total) by mouth daily.    Dispense:  30 tablet    Refill:  2  . amphetamine-dextroamphetamine (ADDERALL XR) 30 MG 24 hr  capsule    Sig: Take 1 capsule (30 mg total) by mouth daily.    Dispense:  30 capsule    Refill:  0    This is a one month supply DO NOT REFILL EARLY, do not fill later than 02/16/2013  . amphetamine-dextroamphetamine (ADDERALL XR) 30 MG 24 hr capsule    Sig: Take 1 capsule (30 mg total) by mouth every morning.    Dispense:  30 capsule    Refill:  0    This is a one month supply DO NOT REFILL EARLY, do not fill earlier than 02/17/2013 or later than 03/20/2013  . amphetamine-dextroamphetamine (ADDERALL XR) 30 MG 24 hr capsule    Sig: Take 1 capsule (30 mg total) by mouth daily.    Dispense:  30 capsule    Refill:  0    This is a one month supply DO NOT REFILL EARLY, do not fill earlier than 03/20/2013 or later than 04/20/2013   Medical Decision Making Problem Points:  Established problem, stable/improving (1), Review of last therapy session (1), Review of psycho-social stressors (1) and Self-limited or minor (1) Data Points:  Review or order clinical lab tests (1) Review of medication regiment & side effects (2)  I certify that outpatient services  furnished can reasonably be expected to improve the patient's condition.   Orson Aloe, MD, [facility]

## 2013-01-19 ENCOUNTER — Ambulatory Visit (INDEPENDENT_AMBULATORY_CARE_PROVIDER_SITE_OTHER): Payer: 59 | Admitting: Psychology

## 2013-01-19 ENCOUNTER — Encounter (HOSPITAL_COMMUNITY): Payer: Self-pay | Admitting: Psychology

## 2013-01-19 DIAGNOSIS — F39 Unspecified mood [affective] disorder: Secondary | ICD-10-CM

## 2013-01-19 DIAGNOSIS — F909 Attention-deficit hyperactivity disorder, unspecified type: Secondary | ICD-10-CM

## 2013-01-19 NOTE — Progress Notes (Signed)
Patient:  [name]   DOB: 1990/10/27  MR Number: [id number]  Location: BEHAVIORAL [facility] [address] [place] [address] Dept: [phone]  Start: 1 PM End: 2 PM  Provider/Observer:     Hershal Coria PSYD  Chief Complaint:      Chief Complaint  Patient presents with  . Agitation  . Stress  . Anxiety    Reason For Service:     The patient was referred by his treating psychiatrist Dr. Lucianne Muss, who felt that he needed to engage in some psychotherapeutic interventions and building some of his coping skills. The patient has been seen by Dr. Lucianne Muss through the [facility] mental health Department and then started seeing her here through [facility]. He is carried a number of diagnoses including conduct disorder with adolescent onset, attention deficit disorder with hyperactivity, and mood disorder NOS. He has been tried on a number of different medications including Concerta and Abilify as well as Depakote and Adderall. He does appear to be responding well to his current medications but there was a concern or interest in helping build more coping skills to do is reduced intellectual capacity and poor coping skills.  Interventions Strategy:  Cognitive/behavioral psychotherapy  Participation Level:   Active  Participation Quality:  Appropriate      Behavioral Observation:  Well Groomed, Alert, and Appropriate.   Current Psychosocial Factors: The patient came in today and reports that he is gotten engaged to his 45 year old girlfriend. He reports that they're continuing to not have sex and it not had sex at all. However, she asked him if he would marry her couple weeks ago in he told her yes. This is a typical pattern for him as he constantly needs some type of girl around to be part of his life. The patient reports that he also got a new dog which makes 3 dogs that he has to take care of. We  talked about this to gain insight regarding his time. He reports that there is no plans for them to get married in the time soon and we uses situations to try to gain better insight into his patterns..  Content of Session:   Reviewed current symptoms and continue to work on building coping skills and strategies.  Current Status:   The patient is struggling right now with the recent death of his father. However, given the patient's past history he is actually managing the situation much better than he would have in the past. He is utilizing his coping skills and continuing to get his life stabilized. He has gotten a place to live that he can afford it has 2 roommates or make it even more affordable for him. However, as I had suggested he had a situation where he wasn't dependent upon these other people to be able to make ends meet but having these 2 other individuals living there we'll make it even more manageable financially but if something doesn't work out with them he will not be in a manageable situation.  Patient Progress:   Very good  Target Goals:   Reduce impulsive and maladaptive behaviors and engage in better use of coping skills.  Last Reviewed:   01/19/2013  Goals Addressed Today:    We continue to work on target goals of reducing the impulsive and manipulative behaviors and better at implementation of coping skills for judgment and future planning.  Impression/Diagnosis:   The patient has been diagnosed  with a number of psychiatric conditions including mood disorder, attention deficit disorder, conduct disorder and other variations of these conditions. I do think that the most appropriate diagnosis at this time is one of attention deficit disorder combined type with the continued rule out of a mood disorder. I have not seen the patient in any significant fluctuation in mood state since I started seeing him. His mood is always been quite cheerful and pleasant with no indications of manic or  hypomanic episodes or depressive episodes to this point.  Diagnosis:    Axis I:  Mood disorder  Attention deficit disorder with hyperactivity(314.01)      Axis II: No diagnosis

## 2013-01-26 ENCOUNTER — Telehealth: Payer: Self-pay | Admitting: Family Medicine

## 2013-01-26 NOTE — Telephone Encounter (Signed)
Pt refused to talk to me. Stated he only wanted to talk to you. He stated he has an appointment with you the 26th of this month but wanted to talk to you sooner. I told patient you may not be able to call because you are seeing patients.

## 2013-01-26 NOTE — Telephone Encounter (Signed)
Pt made an appointment to talk with you

## 2013-01-26 NOTE — Telephone Encounter (Signed)
He talks with nurse.we will see sooner if he desires

## 2013-01-26 NOTE — Telephone Encounter (Signed)
Patient has a question for Dr. Lorin Picket, but patient refused to tell me what it was related to. Please advise.

## 2013-01-27 ENCOUNTER — Encounter: Payer: Self-pay | Admitting: *Deleted

## 2013-02-02 ENCOUNTER — Ambulatory Visit: Payer: Medicare Other | Admitting: Family Medicine

## 2013-02-11 ENCOUNTER — Encounter: Payer: Self-pay | Admitting: Family Medicine

## 2013-02-11 ENCOUNTER — Ambulatory Visit (INDEPENDENT_AMBULATORY_CARE_PROVIDER_SITE_OTHER): Payer: Medicare Other | Admitting: Family Medicine

## 2013-02-11 VITALS — BP 138/84 | HR 70 | Ht 65.0 in | Wt 177.2 lb

## 2013-02-11 DIAGNOSIS — Z0000 Encounter for general adult medical examination without abnormal findings: Secondary | ICD-10-CM

## 2013-02-11 NOTE — Patient Instructions (Signed)
DASH Diet  The DASH diet stands for "Dietary Approaches to Stop Hypertension." It is a healthy eating plan that has been shown to reduce high blood pressure (hypertension) in as little as 14 days, while also possibly providing other significant health benefits. These other health benefits include reducing the risk of breast cancer after menopause and reducing the risk of type 2 diabetes, heart disease, colon cancer, and stroke. Health benefits also include weight loss and slowing kidney failure in patients with chronic kidney disease.   DIET GUIDELINES  · Limit salt (sodium). Your diet should contain less than 1500 mg of sodium daily.  · Limit refined or processed carbohydrates. Your diet should include mostly whole grains. Desserts and added sugars should be used sparingly.  · Include small amounts of heart-healthy fats. These types of fats include nuts, oils, and tub margarine. Limit saturated and trans fats. These fats have been shown to be harmful in the body.  CHOOSING FOODS   The following food groups are based on a 2000 calorie diet. See your Registered Dietitian for individual calorie needs.  Grains and Grain Products (6 to 8 servings daily)  · Eat More Often: Whole-wheat bread, brown rice, whole-grain or wheat pasta, quinoa, popcorn without added fat or salt (air popped).  · Eat Less Often: White bread, white pasta, white rice, cornbread.  Vegetables (4 to 5 servings daily)  · Eat More Often: Fresh, frozen, and canned vegetables. Vegetables may be raw, steamed, roasted, or grilled with a minimal amount of fat.  · Eat Less Often/Avoid: Creamed or fried vegetables. Vegetables in a cheese sauce.  Fruit (4 to 5 servings daily)  · Eat More Often: All fresh, canned (in natural juice), or frozen fruits. Dried fruits without added sugar. One hundred percent fruit juice (½ cup [237 mL] daily).  · Eat Less Often: Dried fruits with added sugar. Canned fruit in light or heavy syrup.  Lean Meats, Fish, and Poultry (2  servings or less daily. One serving is 3 to 4 oz [85-114 g]).  · Eat More Often: Ninety percent or leaner ground beef, tenderloin, sirloin. Round cuts of beef, chicken breast, turkey breast. All fish. Grill, bake, or broil your meat. Nothing should be fried.  · Eat Less Often/Avoid: Fatty cuts of meat, turkey, or chicken leg, thigh, or wing. Fried cuts of meat or fish.  Dairy (2 to 3 servings)  · Eat More Often: Low-fat or fat-free milk, low-fat plain or light yogurt, reduced-fat or part-skim cheese.  · Eat Less Often/Avoid: Milk (whole, 2%). Whole milk yogurt. Full-fat cheeses.  Nuts, Seeds, and Legumes (4 to 5 servings per week)  · Eat More Often: All without added salt.  · Eat Less Often/Avoid: Salted nuts and seeds, canned beans with added salt.  Fats and Sweets (limited)  · Eat More Often: Vegetable oils, tub margarines without trans fats, sugar-free gelatin. Mayonnaise and salad dressings.  · Eat Less Often/Avoid: Coconut oils, palm oils, butter, stick margarine, cream, half and half, cookies, candy, pie.  FOR MORE INFORMATION  The Dash Diet Eating Plan: www.dashdiet.org  Document Released: 07/26/2011 Document Revised: 10/29/2011 Document Reviewed: 07/26/2011  [employer]® Patient Information ©2014 [employer].

## 2013-02-11 NOTE — Progress Notes (Signed)
  Subjective:    Patient ID: [name], male    DOB: 1991-01-07, 22 y.o.   MRN: [id number]  HPI Patient is here today for annual wellness exam. Patient states that he has been off his medications for about 30 days now because of insurance purposes. Plans to start back on them once insurance issue is corrected.  This young man states he's doing very well. He states he is concentrating well he doesn't feel he needs his medication currently. He relates that he hopes to go into the [employer]. He denies any particular will complications or worries currently. He has been involved in a couple relationships that have not worked out  He is concerned about fertility. He states he had a young lady were having relations for 6 months and she never got pregnant so the lady blamed him and left him. He wonders how he can get tested. I talked to him about sperm counts currently right now his insurance would not pay for that he will think about it also told him that if he has not been able to achieve pregnancy in one years time it would be more reasonable.  Past medical history benign family history benign social father passed away from cancer within the past year  Review of Systems  Constitutional: Negative for fever, activity change and appetite change.  HENT: Negative for congestion, rhinorrhea and neck pain.   Eyes: Negative for discharge.  Respiratory: Negative for cough and wheezing.   Cardiovascular: Negative for chest pain.  Gastrointestinal: Negative for vomiting, abdominal pain and blood in stool.  Genitourinary: Negative for frequency and difficulty urinating.  Skin: Negative for rash.  Allergic/Immunologic: Negative for environmental allergies and food allergies.  Neurological: Negative for weakness and headaches.  Psychiatric/Behavioral: Negative for agitation.       Objective:   Physical Exam  Nursing note and vitals reviewed. Constitutional: He appears well-developed and  well-nourished.  HENT:  Head: Normocephalic and atraumatic.  Right Ear: External ear normal.  Left Ear: External ear normal.  Nose: Nose normal.  Mouth/Throat: Oropharynx is clear and moist.  Eyes: EOM are normal. Pupils are equal, round, and reactive to light.  Neck: Normal range of motion. Neck supple. No thyromegaly present.  Cardiovascular: Normal rate, regular rhythm and normal heart sounds.   No murmur heard. Pulmonary/Chest: Effort normal and breath sounds normal. No respiratory distress. He has no wheezes.  Abdominal: Soft. Bowel sounds are normal. He exhibits no distension and no mass. There is no tenderness.  Genitourinary: Penis normal.  Musculoskeletal: Normal range of motion. He exhibits no edema.  Lymphadenopathy:    He has no cervical adenopathy.  Neurological: He is alert. He exhibits normal muscle tone.  Skin: Skin is warm and dry. No erythema.  Psychiatric: He has a normal mood and affect. His behavior is normal. Judgment normal.          Assessment & Plan:  Wellness exam-I. encourage young man exercise watch diet bring his weight down a few pounds I rechecked his blood pressure 130/80 I believe he can try for the military. I don't recommend any lab work or shots today. I don't believe that this patient needs any medications currently

## 2013-02-18 ENCOUNTER — Ambulatory Visit (INDEPENDENT_AMBULATORY_CARE_PROVIDER_SITE_OTHER): Payer: Medicare Other | Admitting: Psychology

## 2013-02-18 DIAGNOSIS — F39 Unspecified mood [affective] disorder: Secondary | ICD-10-CM

## 2013-02-18 DIAGNOSIS — F909 Attention-deficit hyperactivity disorder, unspecified type: Secondary | ICD-10-CM

## 2013-03-20 ENCOUNTER — Ambulatory Visit (HOSPITAL_COMMUNITY): Payer: Self-pay | Admitting: Psychology

## 2013-04-08 ENCOUNTER — Encounter (HOSPITAL_COMMUNITY): Payer: Self-pay | Admitting: Psychology

## 2013-04-08 NOTE — Progress Notes (Signed)
Patient:  [name]   DOB: Nov 09, 1990  MR Number: [id number]  Location: BEHAVIORAL [facility] [address] [place] [address] Dept: [phone]  Start: 1 PM End: 2 PM  Provider/Observer:     Hershal Coria PSYD  Chief Complaint:      Chief Complaint  Patient presents with  . Anxiety  . Depression  . Agitation  . Stress    Reason For Service:     The patient was referred by his treating psychiatrist Dr. Lucianne Muss, who felt that he needed to engage in some psychotherapeutic interventions and building some of his coping skills. The patient has been seen by Dr. Lucianne Muss through the [facility] mental health Department and then started seeing her here through [facility]. He is carried a number of diagnoses including conduct disorder with adolescent onset, attention deficit disorder with hyperactivity, and mood disorder NOS. He has been tried on a number of different medications including Concerta and Abilify as well as Depakote and Adderall. He does appear to be responding well to his current medications but there was a concern or interest in helping build more coping skills to do is reduced intellectual capacity and poor coping skills.  Interventions Strategy:  Cognitive/behavioral psychotherapy  Participation Level:   Active  Participation Quality:  Appropriate      Behavioral Observation:  Well Groomed, Alert, and Appropriate.   Current Psychosocial Factors: The patient reports that while he has been doing better with trying to be more stable and his relationships there continues to be a lot of people in his relationship. He tends to find girls that are younger than him and are in various states of the to try to help and rescue him. This is a common pattern for him and he regularly gets in various levels in trouble.  Content of Session:   Reviewed current symptoms and continue to work on  building coping skills and strategies.  Current Status:   The patient is struggling right now with the recent death of his father. However, given the patient's past history he is actually managing the situation much better than he would have in the past. He is utilizing his coping skills and continuing to get his life stabilized. He has gotten a place to live that he can afford it has 2 roommates or make it even more affordable for him. However, as I had suggested he had a situation where he wasn't dependent upon these other people to be able to make ends meet but having these 2 other individuals living there we'll make it even more manageable financially but if something doesn't work out with them he will not be in a manageable situation.  Patient Progress:   Very good  Target Goals:   Reduce impulsive and maladaptive behaviors and engage in better use of coping skills.  Last Reviewed:   02/18/2013  Goals Addressed Today:    We continue to work on target goals of reducing the impulsive and manipulative behaviors and better at implementation of coping skills for judgment and future planning.  Impression/Diagnosis:   The patient has been diagnosed with a number of psychiatric conditions including mood disorder, attention deficit disorder, conduct disorder and other variations of these conditions. I do think that the most appropriate diagnosis at this time is one of attention deficit disorder combined type with the continued rule out of a mood disorder. I have not seen the patient in any significant fluctuation  in mood state since I started seeing him. His mood is always been quite cheerful and pleasant with no indications of manic or hypomanic episodes or depressive episodes to this point.  Diagnosis:    Axis I:  Mood disorder  Attention deficit disorder with hyperactivity(314.01)      Axis II: No diagnosis

## 2013-04-17 ENCOUNTER — Ambulatory Visit (INDEPENDENT_AMBULATORY_CARE_PROVIDER_SITE_OTHER): Payer: Medicare Other | Admitting: Psychiatry

## 2013-04-17 ENCOUNTER — Encounter (HOSPITAL_COMMUNITY): Payer: Self-pay | Admitting: Psychiatry

## 2013-04-17 ENCOUNTER — Ambulatory Visit (HOSPITAL_COMMUNITY): Payer: Self-pay | Admitting: Psychiatry

## 2013-04-17 VITALS — BP 130/78 | Ht 65.0 in | Wt 177.0 lb

## 2013-04-17 DIAGNOSIS — F909 Attention-deficit hyperactivity disorder, unspecified type: Secondary | ICD-10-CM

## 2013-04-17 DIAGNOSIS — F39 Unspecified mood [affective] disorder: Secondary | ICD-10-CM

## 2013-04-17 DIAGNOSIS — F902 Attention-deficit hyperactivity disorder, combined type: Secondary | ICD-10-CM

## 2013-04-17 MED ORDER — AMPHETAMINE-DEXTROAMPHET ER 30 MG PO CP24: 30.0000 mg | ORAL_CAPSULE | Freq: Every day | ORAL | Status: DC

## 2013-04-17 MED ORDER — GUANFACINE HCL ER 3 MG PO TB24: 3.0000 mg | ORAL_TABLET | Freq: Every day | ORAL | Status: DC

## 2013-04-17 NOTE — Progress Notes (Signed)
Patient ID: [name], male   DOB: 1991-01-22, 22 y.o.   MRN: [id number] [facility] [address] Progress Note [name] MRN: [id number] DOB: Sep 07, 1990 Age: 22 y.o.  Date: 04/17/2013 Start Time: 2:55 PM End Time: 3:08 PM  Chief Complaint: Chief Complaint  Patient presents with  . ADHD  . Depression  . Medication Refill  . Follow-up    Subjective: "I'm doing pretty well."  This patient is a 22 year old single white male who has been living with a friend in [place]. He is about to move in with his girlfriend and her family in [place]. 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 He is here today with his mother and girlfriend.  His mother reports that he was always a very hyperactive child wouldn't listen and acted out. He was constantly in trouble in school. He started on Ritalin at an early age but it made him like a zombie and he lost a lot of weight. Over the years she's been on numerous stimulants. At age 15 his father took custody of him. He did not do well with his father and was very oppositional. He was eventually placed in a group home and went through 3 group homes to his teen years.  At age 66 he was arrested for continued contributing to do the deliquenct of a minor. He was dating a 21 year old girl who ran away from home and was found with him he spent about 40 days in jail but she claims "turn my life around." Shortly after getting out of jail his father got ill with cancer and he spent a lot of time with him and was glad that they could reunite. The patient states she's currently doing well. He's under good control of his behavior. He's not particularly impulsive and is stable to stay focused. His mother states that he can't work full-time because of some onset something that upsets him he becomes very depressed. For now however he sleeping and eating well and seems to be happy with his current girlfriend. He's had no further legal  charges.  The patient states History of Present Illness: He has been on Ritalin, Concerta, Focalin.  He has also been on Depakote, Abilify as well as Strattera. Allergies: Allergies  Allergen Reactions  . Strattera [Atomoxetine Hcl] Other (See Comments)    Became violent on Strattera.  . Bactrim [Sulfamethoxazole-Tmp Ds]   . Sulfonamide Derivatives Rash   Medical History: Past Medical History  Diagnosis Date  . ADHD (attention deficit hyperactivity disorder)   . Unspecified episodic mood disorder   . Acne   . Obsessive-compulsive disorder   . Oppositional defiant disorder    Surgical History: Past Surgical History  Procedure Laterality Date  . Tendon lengthening      leg tendon stretch as child  . Tendon lengthening      age 33   Family History: family history includes Alcohol abuse in his paternal aunt; Bipolar disorder in his cousin; Drug abuse in his paternal aunt. There is no history of Anxiety disorder, Dementia, Paranoid behavior, Physical abuse, Schizophrenia, Seizures, or Sexual abuse. Reviewed today in the office and nothing has changed.  Suicidal Ideation: No Plan Formed: No Patient has means to carry out plan: No  Homicidal Ideation: No Plan Formed: No Patient has means to carry out plan: No  Review of Systems: Psychiatric: Agitation: No Hallucination: No Depressed Mood: No Insomnia: No Hypersomnia: No Altered Concentration: No Feels Worthless: No Grandiose Ideas: No Belief  In Special Powers: No New/Increased Substance Abuse: No Compulsions: No  Neurologic: Headache: No Seizure: No Paresthesias: No  Past Medical Family, Social History: cHe has finished his course work at the [employer]. He is currently living with a friend Patient says that he's dating an 21 year old.  Family History family history includes Alcohol abuse in his paternal aunt; Bipolar disorder in his cousin; Drug abuse in his paternal aunt. There is no history of Anxiety disorder,  Dementia, Paranoid behavior, Physical abuse, Schizophrenia, Seizures, or Sexual abuse.  Medications: Outpatient Encounter Prescriptions as of 04/17/2013  Medication Sig Dispense Refill  . amphetamine-dextroamphetamine (ADDERALL XR) 30 MG 24 hr capsule Take 1 capsule (30 mg total) by mouth daily.  30 capsule  0  . GuanFACINE HCl 3 MG TB24 Take 1 tablet (3 mg total) by mouth daily.  30 tablet  2  . [DISCONTINUED] amphetamine-dextroamphetamine (ADDERALL XR) 30 MG 24 hr capsule Take 1 capsule (30 mg total) by mouth daily.  30 capsule  0  . [DISCONTINUED] GuanFACINE HCl 3 MG TB24 Take 1 tablet (3 mg total) by mouth daily.  30 tablet  2  . amphetamine-dextroamphetamine (ADDERALL XR) 30 MG 24 hr capsule Take 1 capsule (30 mg total) by mouth daily.  30 capsule  0  . amphetamine-dextroamphetamine (ADDERALL XR) 30 MG 24 hr capsule Take 1 capsule (30 mg total) by mouth daily.  30 capsule  0  . [DISCONTINUED] FLUoxetine (PROZAC) 10 MG capsule Take 1 capsule (10 mg total) by mouth daily.  30 capsule  2   No facility-administered encounter medications on file as of 04/17/2013.    Past Psychiatric History/Hospitalization(s): Anxiety: No Bipolar Disorder: Yes Depression: Yes Mania: No Psychosis: No Schizophrenia: No Personality Disorder: No Hospitalization for psychiatric illness: Yes History of Electroconvulsive Shock Therapy: No Prior Suicide Attempts: No  Physical Exam: Constitutional:  BP 130/78  Ht 5\' 5"  (1.651 m)  Wt 177 lb (80.287 kg)  BMI 29.45 kg/m2  General Appearance: alert, oriented, no acute distress and well nourished  Musculoskeletal: Strength & Muscle Tone: within normal limits Gait & Station: normal Patient leans: N/A  Psychiatric: Speech (describe rate, volume, coherence, spontaneity, and abnormalities if any): Normal in volume rate and tone spontaneous  Thought Process (describe rate, content, abstract reasoning, and computation): Organized, goal-directed,  age-appropriate  Associations: Intact  Thoughts: normal  Mental Status: Orientation: oriented to person, place, time/date and situation Mood & Affect: normal affect Attention Span & Concentration: OK  Lab Results: No results found for this or any previous visit (from the past 8736 hour(s)). PCP draws routine labs and nothing is emerging as of concern.  Assessment: Axis I: Mood disorder NOS, ADHD combined type    Axis II: Deferred  Axis III: Wears glasses, acne, Hx scabes  Axis IV: Moderate  Axis V: 65  Plan: I took his vitals.  I reviewed CC, tobacco/med/surg Hx, meds effects/ side effects, problem list, therapies and responses as well as current situation/symptoms discussed options. Continue current effective medications. See orders and pt instructions for more details.  MEDICATIONS this encounter: Meds ordered this encounter  Medications  . GuanFACINE HCl 3 MG TB24    Sig: Take 1 tablet (3 mg total) by mouth daily.    Dispense:  30 tablet    Refill:  2  . amphetamine-dextroamphetamine (ADDERALL XR) 30 MG 24 hr capsule    Sig: Take 1 capsule (30 mg total) by mouth daily.    Dispense:  30 capsule    Refill:  0    Do not fill before 05/18/13  . amphetamine-dextroamphetamine (ADDERALL XR) 30 MG 24 hr capsule    Sig: Take 1 capsule (30 mg total) by mouth daily.    Dispense:  30 capsule    Refill:  0    Do not fill before 06/17/13  . amphetamine-dextroamphetamine (ADDERALL XR) 30 MG 24 hr capsule    Sig: Take 1 capsule (30 mg total) by mouth daily.    Dispense:  30 capsule    Refill:  0   Medical Decision Making Problem Points:  Established problem, stable/improving (1), Review of last therapy session (1), Review of psycho-social stressors (1) and Self-limited or minor (1) Data Points:  Review or order clinical lab tests (1) Review of medication regiment & side effects (2)  I certify that outpatient services furnished can reasonably be expected to improve the  patient's condition.   Diannia Ruder, MD

## 2013-04-24 ENCOUNTER — Ambulatory Visit (HOSPITAL_COMMUNITY)
Admission: RE | Admit: 2013-04-24 | Discharge: 2013-04-24 | Disposition: A | Discharge status: Home / Self Care | Payer: Medicare Other | Source: Ambulatory Visit | Attending: Family Medicine | Admitting: Family Medicine

## 2013-04-24 ENCOUNTER — Ambulatory Visit (INDEPENDENT_AMBULATORY_CARE_PROVIDER_SITE_OTHER): Payer: Medicare Other | Admitting: Psychology

## 2013-04-24 ENCOUNTER — Ambulatory Visit (INDEPENDENT_AMBULATORY_CARE_PROVIDER_SITE_OTHER): Payer: Medicare Other | Admitting: Family Medicine

## 2013-04-24 ENCOUNTER — Encounter: Payer: Self-pay | Admitting: Family Medicine

## 2013-04-24 VITALS — BP 128/90 | Ht 66.0 in | Wt 179.5 lb

## 2013-04-24 DIAGNOSIS — Z87891 Personal history of nicotine dependence: Secondary | ICD-10-CM | POA: Insufficient documentation

## 2013-04-24 DIAGNOSIS — R0602 Shortness of breath: Secondary | ICD-10-CM | POA: Insufficient documentation

## 2013-04-24 DIAGNOSIS — F909 Attention-deficit hyperactivity disorder, unspecified type: Secondary | ICD-10-CM

## 2013-04-24 DIAGNOSIS — F39 Unspecified mood [affective] disorder: Secondary | ICD-10-CM

## 2013-04-24 MED ORDER — ALBUTEROL SULFATE HFA 108 (90 BASE) MCG/ACT IN AERS: 2.0000 | INHALATION_SPRAY | Freq: Four times a day (QID) | RESPIRATORY_TRACT | Status: DC | PRN

## 2013-04-24 NOTE — Progress Notes (Signed)
  Subjective:    Patient ID: [name], male    DOB: 17-Jan-1991, 22 y.o.   MRN: [id number]  Shortness of Breath This is a new problem. The current episode started more than 1 year ago. The problem occurs intermittently. The problem has been gradually worsening. Associated symptoms include chest pain. The symptoms are aggravated by any activity. The patient has no known risk factors for DVT/PE. He has tried nothing for the symptoms. The treatment provided no relief.   Patient relates that he often wakes up in the middle night choking coughing feeling like he is gasping for air. He denies being excessively stressed. He denies productive phlegm. Denies fever or chills. No nausea. PMH benign denies wheezing during the day.  Review of Systems  Respiratory: Positive for shortness of breath.   Cardiovascular: Positive for chest pain.       Objective:   Physical Exam Neck no masses eardrums normal throat is normal lungs are clear no crackles heart is regular. Abdomen soft.       Assessment & Plan:  Hard to discern what exactly is going on here. I would recommend a chest x-ray and pulmonary function tests albuterol may be used as needed. Followup when necessary

## 2013-05-01 ENCOUNTER — Ambulatory Visit (HOSPITAL_COMMUNITY)
Admission: RE | Admit: 2013-05-01 | Discharge: 2013-05-01 | Disposition: A | Discharge status: Home / Self Care | Payer: Medicare Other | Source: Ambulatory Visit | Attending: Family Medicine | Admitting: Family Medicine

## 2013-05-01 DIAGNOSIS — R0602 Shortness of breath: Secondary | ICD-10-CM | POA: Insufficient documentation

## 2013-05-01 MED ORDER — ALBUTEROL SULFATE (5 MG/ML) 0.5% IN NEBU
2.5000 mg | INHALATION_SOLUTION | Freq: Once | RESPIRATORY_TRACT | Status: AC
  Administered 2013-05-01: 2.5 mg via RESPIRATORY_TRACT

## 2013-05-05 NOTE — Procedures (Signed)
NAMEALFORD, GAMERO             ACCOUNT NO.:  [id number]  MEDICAL RECORD NO.:  [id number]  LOCATION:                            FACILITY:  [facility]  PHYSICIAN:  Jalin Erpelding L. Juanetta Gosling, M.D.DATE OF BIRTH:  05/27/1991  DATE OF PROCEDURE: DATE OF DISCHARGE:  05/01/2013                           PULMONARY FUNCTION TEST   ,  REASON FOR PULMONARY FUNCTION TESTING:  Shortness of breath.  1. Spirometry shows no ventilatory defect and no evidence of airflow     obstruction. 2. There is no significant bronchodilator improvement. 3. No cause of shortness of breath is seen on this pulmonary function     test.     Ramon Dredge L. Juanetta Gosling, M.D.     ELH/MEDQ  D:  05/04/2013  T:  05/05/2013  Job:  [id number]

## 2013-05-06 LAB — PULMONARY FUNCTION TEST

## 2013-05-22 ENCOUNTER — Ambulatory Visit (HOSPITAL_COMMUNITY): Payer: Self-pay | Admitting: Psychology

## 2013-05-26 ENCOUNTER — Ambulatory Visit (INDEPENDENT_AMBULATORY_CARE_PROVIDER_SITE_OTHER): Payer: Medicare Other | Admitting: Psychology

## 2013-05-26 DIAGNOSIS — F39 Unspecified mood [affective] disorder: Secondary | ICD-10-CM

## 2013-05-26 DIAGNOSIS — F909 Attention-deficit hyperactivity disorder, unspecified type: Secondary | ICD-10-CM

## 2013-05-29 ENCOUNTER — Telehealth (HOSPITAL_COMMUNITY): Payer: Self-pay | Admitting: Psychology

## 2013-06-12 ENCOUNTER — Encounter (HOSPITAL_COMMUNITY): Payer: Self-pay | Admitting: Psychology

## 2013-06-12 NOTE — Progress Notes (Signed)
Patient:  [name]   DOB: [birth year]  MR Number: [id number]  Location: BEHAVIORAL [facility] [address] [place] [address] Dept: [phone]  Start: 1 PM End: 2 PM  Provider/Observer:     Hershal Coria PSYD  Chief Complaint:      Chief Complaint  Patient presents with  . Agitation  . Stress  . ADD    Reason For Service:     The patient was referred by his treating psychiatrist Dr. Lucianne Muss, who felt that he needed to engage in some psychotherapeutic interventions and building some of his coping skills. The patient has been seen by Dr. Lucianne Muss through the [facility] mental health Department and then started seeing her here through [facility]. He is carried a number of diagnoses including conduct disorder with adolescent onset, attention deficit disorder with hyperactivity, and mood disorder NOS. He has been tried on a number of different medications including Concerta and Abilify as well as Depakote and Adderall. He does appear to be responding well to his current medications but there was a concern or interest in helping build more coping skills to do is reduced intellectual capacity and poor coping skills.  Interventions Strategy:  Cognitive/behavioral psychotherapy  Participation Level:   Active  Participation Quality:  Appropriate      Behavioral Observation:  Well Groomed, Alert, and Appropriate.   Current Psychosocial Factors: The patient comes in today after not seeing him for some time. The last, saw him he brought his new girlfriend for some of the session we talked about some of the issues that he has been dealing with this he wanted her to understand him better. However, the patient reports a lot of happened since I saw him last including his girlfriend being pregnant now and him wanting to get married and have this child. The girlfriend when I met her was a little odd in some  features with regard to her social interaction and she appeared to be easily influenced effected adjustment in the context of needing her. The patient is clearly under a lot of stress even though he is active very happy about this. The patient can be extremely impulsive and I'm worried about how the combination of stress and his impulsivity could lead to some difficulties..  Content of Session:   Reviewed current symptoms and continue to work on building coping skills and strategies.  Current Status:   The patient is now engaged to be married with a new girlfriend which is about his girlfriend in a year. The patient reports that she is pregnant which clearly complicates things. The patient doesn't knowledge that he is still talking to another girlfriend which he initially had contact her when she was under age in a having some legal difficulties over. However, he reports that the sexual activity has occurred with his younger.  Patient Progress:   Very good  Target Goals:   Reduce impulsive and maladaptive behaviors and engage in better use of coping skills.  Last Reviewed:   04/24/2013  Goals Addressed Today:    We continue to work on target goals of reducing the impulsive and manipulative behaviors and better at implementation of coping skills for judgment and future planning.  Impression/Diagnosis:   The patient has been diagnosed with a number of psychiatric conditions including mood disorder, attention deficit disorder, conduct disorder and other variations of these conditions. I do think that the most appropriate diagnosis at this time is  one of attention deficit disorder combined type with the continued rule out of a mood disorder. I have not seen the patient in any significant fluctuation in mood state since I started seeing him. His mood is always been quite cheerful and pleasant with no indications of manic or hypomanic episodes or depressive episodes to this point.  Diagnosis:    Axis I:   Mood disorder  Attention deficit disorder with hyperactivity(314.01)      Axis II: No diagnosis

## 2013-06-14 ENCOUNTER — Other Ambulatory Visit: Payer: Self-pay | Admitting: Family Medicine

## 2013-06-15 ENCOUNTER — Ambulatory Visit (HOSPITAL_COMMUNITY): Payer: Self-pay | Admitting: Psychology

## 2013-06-15 ENCOUNTER — Ambulatory Visit (INDEPENDENT_AMBULATORY_CARE_PROVIDER_SITE_OTHER): Payer: Medicare Other | Admitting: Psychiatry

## 2013-06-15 ENCOUNTER — Encounter (HOSPITAL_COMMUNITY): Payer: Self-pay | Admitting: Psychiatry

## 2013-06-15 VITALS — BP 110/64 | Ht 66.0 in | Wt 176.0 lb

## 2013-06-15 DIAGNOSIS — F39 Unspecified mood [affective] disorder: Secondary | ICD-10-CM

## 2013-06-15 DIAGNOSIS — F909 Attention-deficit hyperactivity disorder, unspecified type: Secondary | ICD-10-CM

## 2013-06-15 DIAGNOSIS — F902 Attention-deficit hyperactivity disorder, combined type: Secondary | ICD-10-CM

## 2013-06-15 MED ORDER — AMPHETAMINE-DEXTROAMPHET ER 30 MG PO CP24: 30.0000 mg | ORAL_CAPSULE | Freq: Every day | ORAL | Status: DC

## 2013-06-15 MED ORDER — GUANFACINE HCL ER 3 MG PO TB24: 3.0000 mg | ORAL_TABLET | Freq: Every day | ORAL | Status: DC
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
## 2013-06-18 ENCOUNTER — Ambulatory Visit (INDEPENDENT_AMBULATORY_CARE_PROVIDER_SITE_OTHER): Payer: Medicare Other | Admitting: Psychology

## 2013-06-18 DIAGNOSIS — F909 Attention-deficit hyperactivity disorder, unspecified type: Secondary | ICD-10-CM

## 2013-06-18 DIAGNOSIS — F39 Unspecified mood [affective] disorder: Secondary | ICD-10-CM

## 2013-06-18 DIAGNOSIS — F902 Attention-deficit hyperactivity disorder, combined type: Secondary | ICD-10-CM

## 2013-06-19 ENCOUNTER — Encounter (HOSPITAL_COMMUNITY): Payer: Self-pay | Admitting: Psychology
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
## 2013-07-10 ENCOUNTER — Encounter (HOSPITAL_COMMUNITY): Payer: Self-pay | Admitting: Psychology
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
## 2013-07-14 ENCOUNTER — Ambulatory Visit (HOSPITAL_COMMUNITY): Payer: Self-pay | Admitting: Psychology

## 2013-07-14 ENCOUNTER — Ambulatory Visit (INDEPENDENT_AMBULATORY_CARE_PROVIDER_SITE_OTHER): Payer: Medicare Other | Admitting: Psychology

## 2013-07-14 ENCOUNTER — Encounter (HOSPITAL_COMMUNITY): Payer: Self-pay | Admitting: Psychology

## 2013-07-14 DIAGNOSIS — F39 Unspecified mood [affective] disorder: Secondary | ICD-10-CM

## 2013-07-14 DIAGNOSIS — F909 Attention-deficit hyperactivity disorder, unspecified type: Secondary | ICD-10-CM

## 2013-07-14 NOTE — Progress Notes (Signed)
Patient:  [name]   DOB: Jul 11, 1991  MR Number: [id number]  Location: BEHAVIORAL [facility] [address] [place] [address] Dept: [phone]  Start: 11 AM End: 12 PM  Provider/Observer:     Hershal Coria PSYD  Chief Complaint:      Chief Complaint  Patient presents with  . Agitation  . Stress    Reason For Service:     The patient was referred by his treating psychiatrist Dr. Lucianne Muss, who felt that he needed to engage in some psychotherapeutic interventions and building some of his coping skills. The patient has been seen by Dr. Lucianne Muss through the [facility] mental health Department and then started seeing her here through [facility]. He is carried a number of diagnoses including conduct disorder with adolescent onset, attention deficit disorder with hyperactivity, and mood disorder NOS. He has been tried on a number of different medications including Concerta and Abilify as well as Depakote and Adderall. He does appear to be responding well to his current medications but there was a concern or interest in helping build more coping skills to do is reduced intellectual capacity and poor coping skills.  Interventions Strategy:  Cognitive/behavioral psychotherapy  Participation Level:   Active  Participation Quality:  Appropriate      Behavioral Observation:  Well Groomed, Alert, and Appropriate.   Current Psychosocial Factors: Patient reports that things are going well, relationship going well and gf's pregnancy going well.   Content of Session:   Reviewed current symptoms and continue to work on building coping skills and strategies.  Current Status:   Patient still doing well and getting married Feb 7th..  Patient Progress:   Very good  Target Goals:   Reduce impulsive and maladaptive behaviors and engage in better use of coping skills.  Last Reviewed:   07/14/2013  Goals  Addressed Today:    We continue to work on target goals of reducing the impulsive and manipulative behaviors and better at implementation of coping skills for judgment and future planning.  Impression/Diagnosis:   The patient has been diagnosed with a number of psychiatric conditions including mood disorder, attention deficit disorder, conduct disorder and other variations of these conditions. I do think that the most appropriate diagnosis at this time is one of attention deficit disorder combined type with the continued rule out of a mood disorder. I have not seen the patient in any significant fluctuation in mood state since I started seeing him. His mood is always been quite cheerful and pleasant with no indications of manic or hypomanic episodes or depressive episodes to this point.  Diagnosis:    Axis I:  Mood disorder  Attention deficit disorder with hyperactivity(314.01)      Axis II: No diagnosis

## 2013-08-01 ENCOUNTER — Emergency Department (HOSPITAL_COMMUNITY)
Admission: EM | Admit: 2013-08-01 | Discharge: 2013-08-01 | Disposition: A | Discharge status: Home / Self Care | Payer: Medicare Other | Attending: Emergency Medicine | Admitting: Emergency Medicine

## 2013-08-01 ENCOUNTER — Encounter (HOSPITAL_COMMUNITY): Payer: Self-pay | Admitting: Emergency Medicine

## 2013-08-01 DIAGNOSIS — Z872 Personal history of diseases of the skin and subcutaneous tissue: Secondary | ICD-10-CM | POA: Insufficient documentation

## 2013-08-01 DIAGNOSIS — Z87891 Personal history of nicotine dependence: Secondary | ICD-10-CM | POA: Insufficient documentation

## 2013-08-01 DIAGNOSIS — H1013 Acute atopic conjunctivitis, bilateral: Secondary | ICD-10-CM

## 2013-08-01 DIAGNOSIS — F909 Attention-deficit hyperactivity disorder, unspecified type: Secondary | ICD-10-CM | POA: Insufficient documentation

## 2013-08-01 DIAGNOSIS — H1010 Acute atopic conjunctivitis, unspecified eye: Secondary | ICD-10-CM | POA: Insufficient documentation

## 2013-08-01 DIAGNOSIS — Z79899 Other long term (current) drug therapy: Secondary | ICD-10-CM | POA: Insufficient documentation

## 2013-08-01 MED ORDER — TETRACAINE HCL 0.5 % OP SOLN
1.0000 [drp] | Freq: Once | OPHTHALMIC | Status: AC
  Administered 2013-08-01: 2 [drp] via OPHTHALMIC
  Filled 2013-08-01: qty 2

## 2013-08-01 MED ORDER — KETOROLAC TROMETHAMINE 0.5 % OP SOLN
1.0000 [drp] | Freq: Once | OPHTHALMIC | Status: AC
  Administered 2013-08-01: 1 [drp] via OPHTHALMIC
  Filled 2013-08-01: qty 5

## 2013-08-01 NOTE — ED Notes (Addendum)
Per patient thinks he is having allergic reaction. Patient reports having numbness to eyes to point he can't open his eyelids at times. Per patient swelling around eyes and feels like something is in left eye. Per patient "blood shot eyes" and dry mouth. Patient states only new changes is he has had in past week is cats and was "put back on" Dextroamp-Amphet 30mg  and Intuniv ER 3mg .

## 2013-08-02 NOTE — ED Provider Notes (Signed)
CSN: [id number]     Arrival date & time 08/01/13  1255 History   First MD Initiated Contact with Patient 08/01/13 1322     Chief Complaint  Patient presents with  . Allergic Reaction   (Consider location/radiation/quality/duration/timing/severity/associated sxs/prior Treatment) HPI Comments: [name] is a 22 y.o. Male presenting with a sensation of bilateral eye pain, left greater than right and bilateral, scratchy,  foreign body sensation along with photophobia making it difficult to keep his eyes open.  He denies visual changes.  He wears contact lenses,  Extended wear,  And has been wearing the current lenses for less than a month.  He believes he may have an allergic reaction, either to cats which they moved into their home (over a month ago) or being placed back on his Intuniv and adderall this week. He has been on these medicines in the past without such a reaction.  He denies sneezing, itching, watery eyes, nasal drainage or congestion, also denies rash.  He has found no alleviators for his symptoms and has not removed his contacts.  He does report a dry mouth which he knows is a side effect of the medications, his eyes do not feel dry.     The history is provided by the patient.    Past Medical History  Diagnosis Date  . ADHD (attention deficit hyperactivity disorder)   . Unspecified episodic mood disorder   . Acne   . Obsessive-compulsive disorder   . Oppositional defiant disorder    Past Surgical History  Procedure Laterality Date  . Tendon lengthening      leg tendon stretch as child  . Tendon lengthening      age 64  . Tooth extraction     Family History  Problem Relation Age of Onset  . Bipolar disorder Cousin   . Alcohol abuse Paternal Aunt   . Drug abuse Paternal Aunt   . Anxiety disorder Neg Hx   . Dementia Neg Hx   . Paranoid behavior Neg Hx   . Physical abuse Neg Hx   . Schizophrenia Neg Hx   . Seizures Neg Hx   . Sexual abuse Neg Hx    History   Substance Use Topics  . Smoking status: Former Smoker -- 0.04 packs/day for 3 years    Types: Cigarettes    Quit date: 12/31/2012  . Smokeless tobacco: Never Used     Comment: "I quit again"  . Alcohol Use: No    Review of Systems  Constitutional: Negative for fever.  HENT: Negative for congestion, postnasal drip, rhinorrhea, sinus pressure, sneezing and sore throat.   Eyes: Positive for photophobia, pain and redness.  Respiratory: Negative for chest tightness and shortness of breath.   Cardiovascular: Negative for chest pain.  Gastrointestinal: Negative for nausea and abdominal pain.  Genitourinary: Negative.   Musculoskeletal: Negative for arthralgias, joint swelling and neck pain.  Skin: Negative.  Negative for rash and wound.  Neurological: Negative for dizziness, weakness, light-headedness, numbness and headaches.  Psychiatric/Behavioral: Negative.     Allergies  Strattera; Bactrim; and Sulfonamide derivatives  Home Medications   Current Outpatient Rx  Name  Route  Sig  Dispense  Refill  . albuterol (PROVENTIL HFA;VENTOLIN HFA) 108 (90 BASE) MCG/ACT inhaler   Inhalation   Inhale 2 puffs into the lungs every 6 (six) hours as needed for wheezing.   1 Inhaler   2   . amphetamine-dextroamphetamine (ADDERALL XR) 30 MG 24 hr capsule   Oral  Take 1 capsule (30 mg total) by mouth daily.   30 capsule   0   . diphenhydrAMINE (BENADRYL) 50 MG capsule   Oral   Take 50 mg by mouth every 6 (six) hours as needed for allergies.         . GuanFACINE HCl 3 MG TB24   Oral   Take 1 tablet (3 mg total) by mouth daily.   30 tablet   2   . ibuprofen (ADVIL,MOTRIN) 600 MG tablet      take 1 tablet by mouth every 6 hours if needed for headache   24 tablet   2    BP 135/84  Pulse 82  Temp(Src) 98.5 F (36.9 C) (Oral)  Resp 18  Ht 5\' 6"  (1.676 m)  Wt 179 lb (81.194 kg)  BMI 28.91 kg/m2  SpO2 100% Physical Exam  Nursing note and vitals reviewed. Constitutional: He  appears well-developed and well-nourished.  HENT:  Head: Normocephalic and atraumatic.  Eyes: EOM and lids are normal. Pupils are equal, round, and reactive to light. Lids are everted and swept, no foreign bodies found. Right eye exhibits no chemosis, no discharge, no exudate and no hordeolum. No foreign body present in the right eye. Left eye exhibits no chemosis, no discharge, no exudate and no hordeolum. No foreign body present in the left eye. Right conjunctiva is injected. Left conjunctiva is injected.  Fundoscopic exam:      The right eye shows no exudate.       The left eye shows no exudate.  Slit lamp exam:      The right eye shows no corneal abrasion, no corneal flare, no corneal ulcer, no foreign body and no fluorescein uptake.       The left eye shows no corneal abrasion, no corneal flare, no corneal ulcer, no foreign body and no fluorescein uptake.  Patient removed contacts prior to exam.  He obtained relief of sx after tetracaine instilled.  Neck: Normal range of motion.  Cardiovascular: Normal rate, regular rhythm, normal heart sounds and intact distal pulses.   Pulmonary/Chest: Effort normal and breath sounds normal. He has no wheezes.  Abdominal: Soft. Bowel sounds are normal. There is no tenderness.  Musculoskeletal: Normal range of motion.  Neurological: He is alert.  Skin: Skin is warm and dry.  Psychiatric: He has a normal mood and affect.    ED Course  Procedures (including critical care time) Labs Review Labs Reviewed - No data to display Imaging Review No results found.  EKG Interpretation   None       MDM   1. Conjunctivitis, acute atopic, bilateral    Exam c/w non infectious conjunctivitis,  Possibly related to contact use,  Could also be dry eye syndrome causing pain and inflammation, ? meds causing.  Doubt cat allergy, no other allergy sx present.  He was given ketorolac drops to apply qid.  Advised to dc contacts,  Wearing glasses only until  re-evaluated by ophthalmology.  Referral given for recheck by Dr Lita Mains,  Pt to call for appt.      Burgess Amor, PA-C 08/02/13 [phone]

## 2013-08-03 ENCOUNTER — Other Ambulatory Visit: Payer: Self-pay | Admitting: Family Medicine

## 2013-08-03 ENCOUNTER — Telehealth: Payer: Self-pay | Admitting: Family Medicine

## 2013-08-03 DIAGNOSIS — H109 Unspecified conjunctivitis: Secondary | ICD-10-CM

## 2013-08-03 NOTE — Telephone Encounter (Signed)
Number referral was put in. Please process and let patient know it has been put through

## 2013-08-03 NOTE — Telephone Encounter (Signed)
Patient needs a referral to Dr Leanna Battles for conjunctivitis. He has an appointment for tomorrow. He was sent there from the ED.

## 2013-08-05 NOTE — ED Provider Notes (Signed)
Medical screening examination/treatment/procedure(s) were performed by non-physician practitioner and as supervising physician I was immediately available for consultation/collaboration.  EKG Interpretation   None        Dayani Winbush, MD 08/05/13 2055 

## 2013-08-11 ENCOUNTER — Ambulatory Visit (INDEPENDENT_AMBULATORY_CARE_PROVIDER_SITE_OTHER): Payer: Medicare Other | Admitting: Psychology

## 2013-08-11 ENCOUNTER — Encounter (HOSPITAL_COMMUNITY): Payer: Self-pay | Admitting: Psychology

## 2013-08-11 DIAGNOSIS — F39 Unspecified mood [affective] disorder: Secondary | ICD-10-CM

## 2013-08-11 DIAGNOSIS — F909 Attention-deficit hyperactivity disorder, unspecified type: Secondary | ICD-10-CM

## 2013-08-11 NOTE — Progress Notes (Signed)
Patient:  [name]   DOB: Aug 10, 1991  MR Number: [id number]  Location: [facility] [address] [place] [address] Dept: [phone]  Start: 1 PM End: 2 PM  Provider/Observer:     Hershal Coria PSYD  Chief Complaint:      Chief Complaint  Patient presents with  . Agitation  . Stress    Reason For Service:     The patient was referred by his treating psychiatrist Dr. Lucianne Muss, who felt that he needed to engage in some psychotherapeutic interventions and building some of his coping skills. The patient has been seen by Dr. Lucianne Muss through the [facility] mental health Department and then started seeing her here through [facility]. He is carried a number of diagnoses including conduct disorder with adolescent onset, attention deficit disorder with hyperactivity, and mood disorder NOS. He has been tried on a number of different medications including Concerta and Abilify as well as Depakote and Adderall. He does appear to be responding well to his current medications but there was a concern or interest in helping build more coping skills to do is reduced intellectual capacity and poor coping skills.  Interventions Strategy:  Cognitive/behavioral psychotherapy  Participation Level:   Active  Participation Quality:  Appropriate      Behavioral Observation:  Well Groomed, Alert, and Appropriate.   Current Psychosocial Factors: The patient reports that he and his girlfriend are still doing well and her pregnancy has been going fine.  The patient has been looking for a part time job to see how he can on working.   Content of Session:   Reviewed current symptoms and continue to work on building coping skills and strategies.  Current Status:   Patient still doing well and getting married Feb 7th..  Patient Progress:   Very good  Target Goals:   Reduce impulsive and maladaptive behaviors  and engage in better use of coping skills.  Last Reviewed:   08/11/2013  Goals Addressed Today:    We continue to work on target goals of reducing the impulsive and manipulative behaviors and better at implementation of coping skills for judgment and future planning.  Impression/Diagnosis:   The patient has been diagnosed with a number of psychiatric conditions including mood disorder, attention deficit disorder, conduct disorder and other variations of these conditions. I do think that the most appropriate diagnosis at this time is one of attention deficit disorder combined type with the continued rule out of a mood disorder. I have not seen the patient in any significant fluctuation in mood state since I started seeing him. His mood is always been quite cheerful and pleasant with no indications of manic or hypomanic episodes or depressive episodes to this point.  Diagnosis:    Axis I:  Mood disorder  Attention deficit disorder with hyperactivity(314.01)      Axis II: No diagnosis

## 2013-09-09 ENCOUNTER — Ambulatory Visit (INDEPENDENT_AMBULATORY_CARE_PROVIDER_SITE_OTHER): Payer: Medicare Other | Admitting: Psychology

## 2013-09-09 ENCOUNTER — Encounter (HOSPITAL_COMMUNITY): Payer: Self-pay | Admitting: Psychology

## 2013-09-09 DIAGNOSIS — F39 Unspecified mood [affective] disorder: Secondary | ICD-10-CM

## 2013-09-09 DIAGNOSIS — F909 Attention-deficit hyperactivity disorder, unspecified type: Secondary | ICD-10-CM

## 2013-09-09 NOTE — Progress Notes (Signed)
Patient:  [name]   DOB: 08/16/1991  MR Number: [id number]  Location: [facility] [address] [place] [address] Dept: [phone]  Start: 1 PM End: 2 PM  Provider/Observer:     Edgardo Roys PSYD  Chief Complaint:      Chief Complaint  Patient presents with  . Agitation  . ADD  . Depression    Reason For Service:     The patient was referred by his treating psychiatrist Dr. Dwyane Dee, who felt that he needed to engage in some psychotherapeutic interventions and building some of his coping skills. The patient has been seen by Dr. Dwyane Dee through the [facility] mental health Department and then started seeing her here through [facility]. He is carried a number of diagnoses including conduct disorder with adolescent onset, attention deficit disorder with hyperactivity, and mood disorder NOS. He has been tried on a number of different medications including Concerta and Abilify as well as Depakote and Adderall. He does appear to be responding well to his current medications but there was a concern or interest in helping build more coping skills to do is reduced intellectual capacity and poor coping skills.  Interventions Strategy:  Cognitive/behavioral psychotherapy  Participation Level:   Active  Participation Quality:  Appropriate      Behavioral Observation:  Well Groomed, Alert, and Appropriate.   Current Psychosocial Factors: The patient reports that he has been doing ok and the relationship has been going well.  Got a new home with more room for the baby.  The patient reports that not legal isses etc.  Patient has started part-time work and and doing ok right now.   Content of Session:   Reviewed current symptoms and continue to work on building coping skills and strategies.  Current Status:   Patient still doing well and marriage still planned on Feb 7th..  Patient  Progress:   Very good  Target Goals:   Reduce impulsive and maladaptive behaviors and engage in better use of coping skills.  Last Reviewed:   09/09/2013  Goals Addressed Today:    We continue to work on target goals of reducing the impulsive and manipulative behaviors and better at implementation of coping skills for judgment and future planning.  Impression/Diagnosis:   The patient has been diagnosed with a number of psychiatric conditions including mood disorder, attention deficit disorder, conduct disorder and other variations of these conditions. I do think that the most appropriate diagnosis at this time is one of attention deficit disorder combined type with the continued rule out of a mood disorder. I have not seen the patient in any significant fluctuation in mood state since I started seeing him. His mood is always been quite cheerful and pleasant with no indications of manic or hypomanic episodes or depressive episodes to this point.  Diagnosis:    Axis I:  Mood disorder  Attention deficit disorder with hyperactivity(314.01)      Axis II: No diagnosis

## 2013-09-15 ENCOUNTER — Encounter (HOSPITAL_COMMUNITY): Payer: Self-pay | Admitting: Psychiatry

## 2013-09-15 ENCOUNTER — Ambulatory Visit (INDEPENDENT_AMBULATORY_CARE_PROVIDER_SITE_OTHER): Payer: Medicare Other | Admitting: Psychiatry

## 2013-09-15 VITALS — BP 120/88 | Ht 66.0 in | Wt 169.0 lb

## 2013-09-15 DIAGNOSIS — F909 Attention-deficit hyperactivity disorder, unspecified type: Secondary | ICD-10-CM

## 2013-09-15 DIAGNOSIS — F39 Unspecified mood [affective] disorder: Secondary | ICD-10-CM

## 2013-09-15 MED ORDER — GUANFACINE HCL 2 MG PO TABS: 2.0000 mg | ORAL_TABLET | Freq: Every day | ORAL | Status: DC

## 2013-09-15 NOTE — Progress Notes (Signed)
Patient ID: RICHAR DUNKLEE, male   DOB: 07-23-1991, 23 y.o.   MRN: [id number] Patient ID: FOX SALMINEN, male   DOB: 26-Jul-1991, 23 y.o.   MRN: [id number] Patient ID: DONIE LEMELIN, male   DOB: 07/08/1991, 23 y.o.   MRN: [id number] [facility] [id number] Progress Note AHMADOU BOLZ MRN: [id number] DOB: December 03, 1990 Age: 23 y.o.  Date: 09/15/2013 Start Time: 2:55 PM End Time: 3:08 PM  Chief Complaint: Chief Complaint  Patient presents with  . ADHD  . Follow-up    Subjective: "I'm doing pretty well."  This patient is a 23 year old single white male who has been living with a friend in [place]. He is about to move in with his girlfriend and her family in [place]. 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 He is here today with his mother and girlfriend.  His mother reports that he was always a very hyperactive child wouldn't listen and acted out. He was constantly in trouble in school. He started on Ritalin at an early age but it made him like a zombie and he lost a lot of weight. Over the years she's been on numerous stimulants. At age 17 his father took custody of him. He did not do well with his father and was very oppositional. He was eventually placed in a group home and went through 3 group homes to his teen years.  At age 46 he was arrested for continued contributing to do the deliquenct of a minor. He was dating a 22 year old girl who ran away from home and was found with him he spent about 40 days in jail but he claims "turn my life around." Shortly after getting out of jail his father got ill with cancer and he spent a lot of time with him and was glad that they could reunite. The patient states he's currently doing well. He's under good control of his behavior. He's not particularly impulsive and is stable to stay focused. His mother states that he can't work full-time because of some onset something that upsets him he becomes very depressed. For now  however he sleeping and eating well and seems to be happy with his current girlfriend. He's had no further legal charges.  The patient returns after 2 months. He tells me that he is totally gone off both the Adderall and the intuitive. He states that he didn't feel like he needed the medications any more. He has a part-time job Theme park manager at [employer] and eventually wants to get off disability. He is getting married next week and his wife is expecting. He states that sometimes he gets overly agitated and excited about the baby but he has not been violent. He's been able to focus and do his job correctly. He doesn't want to go back on stimulants but I told him perhaps we could try a lower dose of guanfacine to help his agitation. He is in agreement   History of Present Illness: He has been on Ritalin, Concerta, Focalin.  He has also been on Depakote, Abilify as well as Strattera. Allergies: Allergies  Allergen Reactions  . Strattera [Atomoxetine Hcl] Other (See Comments)    Became violent on Strattera.  . Bactrim [Sulfamethoxazole-Tmp Ds]   . Sulfonamide Derivatives Rash   Medical History: Past Medical History  Diagnosis Date  . ADHD (attention deficit hyperactivity disorder)   . Unspecified episodic mood disorder   . Acne   . Obsessive-compulsive disorder   . Oppositional defiant  disorder    Surgical History: Past Surgical History  Procedure Laterality Date  . Tendon lengthening      leg tendon stretch as child  . Tendon lengthening      age 89  . Tooth extraction     Family History: family history includes Alcohol abuse in his paternal aunt; Bipolar disorder in his cousin; Drug abuse in his paternal aunt. There is no history of Anxiety disorder, Dementia, Paranoid behavior, Physical abuse, Schizophrenia, Seizures, or Sexual abuse. Reviewed today in the office and nothing has changed.  Suicidal Ideation: No Plan Formed: No Patient has means to carry out plan: No  Homicidal  Ideation: No Plan Formed: No Patient has means to carry out plan: No  Review of Systems: Psychiatric: Agitation: No Hallucination: No Depressed Mood: No Insomnia: No Hypersomnia: No Altered Concentration: No Feels Worthless: No Grandiose Ideas: No Belief In Special Powers: No New/Increased Substance Abuse: No Compulsions: No  Neurologic: Headache: No Seizure: No Paresthesias: No  Past Medical Family, Social History: cHe has finished his course work at the [employer]. He is currently living with a friend Patient says that he's dating an 40 year old.  Family History family history includes Alcohol abuse in his paternal aunt; Bipolar disorder in his cousin; Drug abuse in his paternal aunt. There is no history of Anxiety disorder, Dementia, Paranoid behavior, Physical abuse, Schizophrenia, Seizures, or Sexual abuse.  Medications: Outpatient Encounter Prescriptions as of 09/15/2013  Medication Sig  . albuterol (PROVENTIL HFA;VENTOLIN HFA) 108 (90 BASE) MCG/ACT inhaler Inhale 2 puffs into the lungs every 6 (six) hours as needed for wheezing.  Marland Kitchen ibuprofen (ADVIL,MOTRIN) 600 MG tablet take 1 tablet by mouth every 6 hours if needed for headache  . guanFACINE (TENEX) 2 MG tablet Take 1 tablet (2 mg total) by mouth at bedtime.  . [DISCONTINUED] amphetamine-dextroamphetamine (ADDERALL XR) 30 MG 24 hr capsule Take 1 capsule (30 mg total) by mouth daily.  . [DISCONTINUED] diphenhydrAMINE (BENADRYL) 50 MG capsule Take 50 mg by mouth every 6 (six) hours as needed for allergies.  . [DISCONTINUED] GuanFACINE HCl 3 MG TB24 Take 1 tablet (3 mg total) by mouth daily.    Past Psychiatric History/Hospitalization(s): Anxiety: No Bipolar Disorder: Yes Depression: Yes Mania: No Psychosis: No Schizophrenia: No Personality Disorder: No Hospitalization for psychiatric illness: Yes History of Electroconvulsive Shock Therapy: No Prior Suicide Attempts: No  Physical Exam: Constitutional:  BP  120/88  Ht 5\' 6"  (1.676 m)  Wt 169 lb (76.658 kg)  BMI 27.29 kg/m2  General Appearance: alert, oriented, no acute distress and well nourished  Musculoskeletal: Strength & Muscle Tone: within normal limits Gait & Station: normal Patient leans: N/A  Psychiatric: Speech (describe rate, volume, coherence, spontaneity, and abnormalities if any): Normal in volume rate and tone spontaneous  Thought Process (describe rate, content, abstract reasoning, and computation): Organized, goal-directed, age-appropriate  Associations: Intact  Thoughts: normal  Mental Status: Orientation: oriented to person, place, time/date and situation Mood & Affect: normal affect Attention Span & Concentration: OK  Lab Results:  Results for orders placed in visit on 04/24/13 (from the past 8736 hour(s))  PULMONARY FUNCTION TEST   Collection Time    05/06/13  3:14 PM      Result Value Range   FEV1       FVC       FEV1/FVC       TLC       DLCO       PCP draws routine labs and nothing is  emerging as of concern.  Assessment: Axis I: Mood disorder NOS, ADHD combined type    Axis II: Deferred  Axis III: Wears glasses, acne, Hx scabes  Axis IV: Moderate  Axis V: 65  Plan: I took his vitals.  I reviewed CC, tobacco/med/surg Hx, meds effects/ side effects, problem list, therapies and responses as well as current situation/symptoms discussed options. He is totally off stimulants and to remain so as long as he is doing well. He will (guanfacine 2 mg prescription. He will use it if he needs to He'll return in 2 months See orders and pt instructions for more details.  MEDICATIONS this encounter: Meds ordered this encounter  Medications  . guanFACINE (TENEX) 2 MG tablet    Sig: Take 1 tablet (2 mg total) by mouth at bedtime.    Dispense:  30 tablet    Refill:  2   Medical Decision Making Problem Points:  Established problem, stable/improving (1), Review of last therapy session (1), Review of  psycho-social stressors (1) and Self-limited or minor (1) Data Points:  Review or order clinical lab tests (1) Review of medication regiment & side effects (2)  I certify that outpatient services furnished can reasonably be expected to improve the patient's condition.   Diannia RuderOSS, Iana Buzan, MD

## 2013-09-21 ENCOUNTER — Telehealth: Payer: Self-pay | Admitting: *Deleted

## 2013-09-21 ENCOUNTER — Other Ambulatory Visit: Payer: Self-pay | Admitting: *Deleted

## 2013-09-21 NOTE — Telephone Encounter (Signed)
Spoke with patient. He said he was having chest pain in the center of his chest. He said he has acid reflux and has not been taking his meds. Dr. Nicki Reaper advised for him to take Maalox or Mylanta and has an appt tomorrow morning. Pt verbalized understanding.

## 2013-09-22 ENCOUNTER — Encounter: Payer: Self-pay | Admitting: Family Medicine

## 2013-09-22 ENCOUNTER — Ambulatory Visit (INDEPENDENT_AMBULATORY_CARE_PROVIDER_SITE_OTHER): Payer: Medicare Other | Admitting: Family Medicine

## 2013-09-22 VITALS — BP 132/90 | Temp 98.9°F | Ht 66.0 in | Wt 172.0 lb

## 2013-09-22 DIAGNOSIS — IMO0001 Reserved for inherently not codable concepts without codable children: Secondary | ICD-10-CM

## 2013-09-22 DIAGNOSIS — R5383 Other fatigue: Secondary | ICD-10-CM

## 2013-09-22 DIAGNOSIS — Z0000 Encounter for general adult medical examination without abnormal findings: Secondary | ICD-10-CM

## 2013-09-22 DIAGNOSIS — R030 Elevated blood-pressure reading, without diagnosis of hypertension: Secondary | ICD-10-CM

## 2013-09-22 DIAGNOSIS — Z0189 Encounter for other specified special examinations: Secondary | ICD-10-CM

## 2013-09-22 DIAGNOSIS — R5381 Other malaise: Secondary | ICD-10-CM

## 2013-09-22 DIAGNOSIS — R079 Chest pain, unspecified: Secondary | ICD-10-CM

## 2013-09-22 LAB — LIPID PANEL
CHOLESTEROL: 240 mg/dL — AB (ref 0–200)
HDL: 47 mg/dL (ref >39)
LDL Cholesterol: 161 mg/dL — ABNORMAL HIGH (ref 0–99)
TRIGLYCERIDES: 162 mg/dL — AB (ref <150)
Total CHOL/HDL Ratio: 5.1 Ratio
VLDL: 32 mg/dL (ref 0–40)

## 2013-09-22 LAB — BASIC METABOLIC PANEL
BUN: 19 mg/dL (ref 6–23)
CHLORIDE: 103 meq/L (ref 96–112)
CO2: 29 mEq/L (ref 19–32)
CREATININE: 0.66 mg/dL (ref 0.50–1.35)
Calcium: 9.3 mg/dL (ref 8.4–10.5)
GLUCOSE: 85 mg/dL (ref 70–99)
Potassium: 4.7 mEq/L (ref 3.5–5.3)
Sodium: 142 mEq/L (ref 135–145)

## 2013-09-22 LAB — GLUCOSE, RANDOM: GLUCOSE: 85 mg/dL (ref 70–99)

## 2013-09-22 MED ORDER — NAPROXEN 500 MG PO TABS: 500.0000 mg | ORAL_TABLET | Freq: Two times a day (BID) | ORAL | Status: DC

## 2013-09-22 NOTE — Patient Instructions (Signed)
DASH Diet  The DASH diet stands for "Dietary Approaches to Stop Hypertension." It is a healthy eating plan that has been shown to reduce high blood pressure (hypertension) in as little as 14 days, while also possibly providing other significant health benefits. These other health benefits include reducing the risk of breast cancer after menopause and reducing the risk of type 2 diabetes, heart disease, colon cancer, and stroke. Health benefits also include weight loss and slowing kidney failure in patients with chronic kidney disease.   DIET GUIDELINES  · Limit salt (sodium). Your diet should contain less than 1500 mg of sodium daily.  · Limit refined or processed carbohydrates. Your diet should include mostly whole grains. Desserts and added sugars should be used sparingly.  · Include small amounts of heart-healthy fats. These types of fats include nuts, oils, and tub margarine. Limit saturated and trans fats. These fats have been shown to be harmful in the body.  CHOOSING FOODS   The following food groups are based on a 2000 calorie diet. See your Registered Dietitian for individual calorie needs.  Grains and Grain Products (6 to 8 servings daily)  · Eat More Often: Whole-wheat bread, brown rice, whole-grain or wheat pasta, quinoa, popcorn without added fat or salt (air popped).  · Eat Less Often: White bread, white pasta, white rice, cornbread.  Vegetables (4 to 5 servings daily)  · Eat More Often: Fresh, frozen, and canned vegetables. Vegetables may be raw, steamed, roasted, or grilled with a minimal amount of fat.  · Eat Less Often/Avoid: Creamed or fried vegetables. Vegetables in a cheese sauce.  Fruit (4 to 5 servings daily)  · Eat More Often: All fresh, canned (in natural juice), or frozen fruits. Dried fruits without added sugar. One hundred percent fruit juice (½ cup [237 mL] daily).  · Eat Less Often: Dried fruits with added sugar. Canned fruit in light or heavy syrup.  Lean Meats, Fish, and Poultry (2  servings or less daily. One serving is 3 to 4 oz [85-114 g]).  · Eat More Often: Ninety percent or leaner ground beef, tenderloin, sirloin. Round cuts of beef, chicken breast, turkey breast. All fish. Grill, bake, or broil your meat. Nothing should be fried.  · Eat Less Often/Avoid: Fatty cuts of meat, turkey, or chicken leg, thigh, or wing. Fried cuts of meat or fish.  Dairy (2 to 3 servings)  · Eat More Often: Low-fat or fat-free milk, low-fat plain or light yogurt, reduced-fat or part-skim cheese.  · Eat Less Often/Avoid: Milk (whole, 2%). Whole milk yogurt. Full-fat cheeses.  Nuts, Seeds, and Legumes (4 to 5 servings per week)  · Eat More Often: All without added salt.  · Eat Less Often/Avoid: Salted nuts and seeds, canned beans with added salt.  Fats and Sweets (limited)  · Eat More Often: Vegetable oils, tub margarines without trans fats, sugar-free gelatin. Mayonnaise and salad dressings.  · Eat Less Often/Avoid: Coconut oils, palm oils, butter, stick margarine, cream, half and half, cookies, candy, pie.  FOR MORE INFORMATION  The Dash Diet Eating Plan: www.dashdiet.org  Document Released: 07/26/2011 Document Revised: 10/29/2011 Document Reviewed: 07/26/2011  [employer]® Patient Information ©2014 [employer].

## 2013-09-22 NOTE — Progress Notes (Signed)
   Subjective:    Patient ID: [name], male    DOB: 20-Jun-1991, 23 y.o.   MRN: [id number]  Chest Pain  This is a new problem. The current episode started in the past 7 days. The onset quality is sudden. The problem has been gradually worsening. The pain is present in the lateral region (right sternal border). The pain is mild. The quality of the pain is described as sharp. The pain does not radiate. Associated symptoms include dizziness. Pertinent negatives include no abdominal pain, back pain, diaphoresis, exertional chest pressure, fever, irregular heartbeat, malaise/fatigue, nausea, palpitations, shortness of breath or weakness. The pain is aggravated by nothing. He has tried nothing for the symptoms. The treatment provided no relief. There are no known risk factors.  Chest pain in middle of chest. Pain is sharp. Off and on for the past week. Patient states he has been under a lot of stress.   Lightheaded and dizziness started about 1 week ago.   Elevated blood pressure for the past week.     Review of Systems  Constitutional: Negative for fever, malaise/fatigue, diaphoresis, activity change, appetite change and fatigue.  Respiratory: Positive for chest tightness. Negative for shortness of breath.   Cardiovascular: Positive for chest pain. Negative for palpitations.  Gastrointestinal: Negative for nausea and abdominal pain.  Endocrine: Negative for polydipsia and polyphagia.  Musculoskeletal: Negative for back pain.  Neurological: Positive for dizziness. Negative for weakness.  Psychiatric/Behavioral: Negative for confusion.       Objective:   Physical Exam  Vitals reviewed. Constitutional: He appears well-nourished. No distress.  Cardiovascular: Normal rate, regular rhythm and normal heart sounds.   No murmur heard. Pulmonary/Chest: Effort normal and breath sounds normal. No respiratory distress.  Musculoskeletal: He exhibits no edema.  Lymphadenopathy:    He has no  cervical adenopathy.  Neurological: He is alert.  Psychiatric: His behavior is normal.    122/70      Assessment & Plan:  EKG-overall normal. Chest pain-musculoskeletal. Probably related to some distress he is going through. Recommend anti-inflammatory twice daily. Proper diet no need for cardiac testing. Patient concerned about blood pressure heart healthy diet discussed. No need for medication currently recheck twice was normal

## 2013-09-23 ENCOUNTER — Encounter: Payer: Self-pay | Admitting: Family Medicine

## 2013-09-23 LAB — TSH: TSH: 0.972 u[IU]/mL (ref 0.350–4.500)

## 2013-09-23 LAB — T4: T4 TOTAL: 10.4 ug/dL (ref 5.0–12.5)

## 2013-09-23 LAB — D-DIMER, QUANTITATIVE: D-Dimer, Quant: 0.27 ug/mL-FEU (ref 0.00–0.48)

## 2013-10-12 ENCOUNTER — Ambulatory Visit (INDEPENDENT_AMBULATORY_CARE_PROVIDER_SITE_OTHER): Payer: Medicare Other | Admitting: Psychology

## 2013-10-12 DIAGNOSIS — F909 Attention-deficit hyperactivity disorder, unspecified type: Secondary | ICD-10-CM

## 2013-10-12 DIAGNOSIS — F39 Unspecified mood [affective] disorder: Secondary | ICD-10-CM

## 2013-10-14 ENCOUNTER — Encounter: Payer: Self-pay | Admitting: Family Medicine

## 2013-10-16 ENCOUNTER — Encounter (HOSPITAL_COMMUNITY): Payer: Self-pay | Admitting: Psychology

## 2013-10-16 NOTE — Progress Notes (Signed)
Patient:  [name]   DOB: Dec 22, 1990  MR Number: [id number]  Location: [facility] [address] [place] [address] Dept: [phone]  Start: 11 AM End: 12 PM  Provider/Observer:     Edgardo Roys PSYD  Chief Complaint:      Chief Complaint  Patient presents with  . Agitation  . Stress  . Depression    Reason For Service:     The patient was referred by his treating psychiatrist Dr. Dwyane Dee, who felt that he needed to engage in some psychotherapeutic interventions and building some of his coping skills. The patient has been seen by Dr. Dwyane Dee through the [facility] mental health Department and then started seeing her here through [facility]. He is carried a number of diagnoses including conduct disorder with adolescent onset, attention deficit disorder with hyperactivity, and mood disorder NOS. He has been tried on a number of different medications including Concerta and Abilify as well as Depakote and Adderall. He does appear to be responding well to his current medications but there was a concern or interest in helping build more coping skills to do is reduced intellectual capacity and poor coping skills.  Interventions Strategy:  Cognitive/behavioral psychotherapy  Participation Level:   Active  Participation Quality:  Appropriate      Behavioral Observation:  Well Groomed, Alert, and Appropriate.   Current Psychosocial Factors: The patient reports that he and his fiance continued to do fairly well in that her pregnancy is progressing. I am concerned because they got another dog recently and clearly this cup was not making any great decisions for planning very well. The patient reports that he is stopped taking his psychostimulant medication and feels like he is doing fine without it appears however, reviewing medical records and talking with him he talks about feeling tired  and experiencing chest pain. He saw his primary care physician about this and they identified high cholesterol but no other explanations for his chest pain. The patient is clearly gained a significant amount of weight recently and I'm concerned about his eating patterns and how his current lifestyle is getting to be problematic for him.  Content of Session:   Reviewed current symptoms and continue to work on building coping skills and strategies.  Current Status:   Patient is still struggling in  a number of ways.  Patient Progress:   Very good  Target Goals:   Reduce impulsive and maladaptive behaviors and engage in better use of coping skills.  Last Reviewed:   10/12/2013  Goals Addressed Today:    We continue to work on target goals of reducing the impulsive and manipulative behaviors and better at implementation of coping skills for judgment and future planning.  Impression/Diagnosis:   The patient has been diagnosed with a number of psychiatric conditions including mood disorder, attention deficit disorder, conduct disorder and other variations of these conditions. I do think that the most appropriate diagnosis at this time is one of attention deficit disorder combined type with the continued rule out of a mood disorder. I have not seen the patient in any significant fluctuation in mood state since I started seeing him. His mood is always been quite cheerful and pleasant with no indications of manic or hypomanic episodes or depressive episodes to this point.  Diagnosis:    Axis I:  Mood disorder  Attention deficit disorder with hyperactivity(314.01)      Axis II: No diagnosis

## 2013-11-09 ENCOUNTER — Ambulatory Visit (HOSPITAL_COMMUNITY): Payer: Self-pay | Admitting: Psychology

## 2013-11-16 ENCOUNTER — Encounter (HOSPITAL_COMMUNITY): Payer: Self-pay | Admitting: Psychiatry

## 2013-11-16 ENCOUNTER — Ambulatory Visit (HOSPITAL_COMMUNITY): Payer: Self-pay | Admitting: Psychiatry

## 2013-11-19 ENCOUNTER — Encounter (HOSPITAL_COMMUNITY): Payer: Self-pay | Admitting: Psychology

## 2013-11-19 ENCOUNTER — Ambulatory Visit (INDEPENDENT_AMBULATORY_CARE_PROVIDER_SITE_OTHER): Payer: Medicare Other | Admitting: Psychology

## 2013-11-19 DIAGNOSIS — F39 Unspecified mood [affective] disorder: Secondary | ICD-10-CM

## 2013-11-19 NOTE — Progress Notes (Signed)
   PROGRESS NOTE  Patient:  [name]   DOB: [birth year]  MR Number: [id number]  Location: [facility] [address] [place] [address] Dept: [phone]  Start: 1 PM End: 2 PM  Provider/Observer:     Edgardo Roys PSYD  Chief Complaint:      Chief Complaint  Patient presents with  . Agitation  . Stress  . Depression    Reason For Service:     The patient was referred by his treating psychiatrist Dr. Dwyane Dee, who felt that he needed to engage in some psychotherapeutic interventions and building some of his coping skills. The patient has been seen by Dr. Dwyane Dee through the [facility] mental health Department and then started seeing her here through [facility]. He is carried a number of diagnoses including conduct disorder with adolescent onset, attention deficit disorder with hyperactivity, and mood disorder NOS. He has been tried on a number of different medications including Concerta and Abilify as well as Depakote and Adderall. He does appear to be responding well to his current medications but there was a concern or interest in helping build more coping skills to do is reduced intellectual capacity and poor coping skills.  Interventions Strategy:  Cognitive/behavioral psychotherapy  Participation Level:   Active  Participation Quality:  Appropriate      Behavioral Observation:  Well Groomed, Alert, and Appropriate.   Current Psychosocial Factors: The patient reports that he is doing well but anxious about baby on the way.  Got married this past Monday.  Content of Session:   Reviewed current symptoms and continue to work on building coping skills and strategies.  Current Status:   Patient is still struggling in  a number of ways.  Patient Progress:   Very good  Target Goals:   Reduce impulsive and maladaptive behaviors and engage in better use of coping  skills.  Last Reviewed:   10/12/2013  Goals Addressed Today:    We continue to work on target goals of reducing the impulsive and manipulative behaviors and better at implementation of coping skills for judgment and future planning.  Impression/Diagnosis:   The patient has been diagnosed with a number of psychiatric conditions including mood disorder, attention deficit disorder, conduct disorder and other variations of these conditions. I do think that the most appropriate diagnosis at this time is one of attention deficit disorder combined type with the continued rule out of a mood disorder. I have not seen the patient in any significant fluctuation in mood state since I started seeing him. His mood is always been quite cheerful and pleasant with no indications of manic or hypomanic episodes or depressive episodes to this point.  Diagnosis:    Axis I:  No diagnosis found.      Axis II: No diagnosis      Lillard Bailon R, PsyD 11/19/2013

## 2013-11-24 ENCOUNTER — Ambulatory Visit (INDEPENDENT_AMBULATORY_CARE_PROVIDER_SITE_OTHER): Payer: Medicare Other | Admitting: Psychiatry

## 2013-11-24 ENCOUNTER — Encounter (HOSPITAL_COMMUNITY): Payer: Self-pay | Admitting: Psychiatry

## 2013-11-24 VITALS — BP 130/70 | Ht 66.0 in | Wt 181.0 lb

## 2013-11-24 DIAGNOSIS — F909 Attention-deficit hyperactivity disorder, unspecified type: Secondary | ICD-10-CM

## 2013-11-24 DIAGNOSIS — F39 Unspecified mood [affective] disorder: Secondary | ICD-10-CM

## 2013-11-24 NOTE — Progress Notes (Signed)
Patient ID: [name], Stokes   DOB: 03-09-1991, 23 y.o.   MRN: [id number] Patient ID: [name], Stokes   DOB: 02/09/1991, 23 y.o.   MRN: [id number] Patient ID: [name], Stokes   DOB: [birth year], 23 y.o.   MRN: [id number] Patient ID: [name], Stokes   DOB: Feb 08, 1991, 23 y.o.   MRN: [id number] [facility] [id number] Progress Note [name] MRN: [id number] DOB: [birth year] Age: 23 y.o.  Date: 11/24/2013 Start Time: 2:55 PM End Time: 3:08 PM  Chief Complaint: Chief Complaint  Patient presents with  . Agitation  . Follow-up    Subjective: "I'm doing pretty well."  This patient is a 23 year old single white Stokes who has been living with his girlfriend and her family in [name]. 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 Brent is here today with his mother and girlfriend.  His mother reports that Brent was always a very hyperactive child wouldn't listen and acted out. Brent was constantly in trouble in school. Brent started on Ritalin at an early age but it made him like a zombie and Brent lost a lot of weight. Over the years she's been on numerous stimulants. At age 12 his father took custody of him. Brent did not do well with his father and was very oppositional. Brent was eventually placed in a group home and went through 3 group homes to his teen years.  At age 16 Brent was arrested for continued contributing to do the deliquenct of a minor. Brent was dating a 22 year old girl who ran away from home and was found with him Brent spent about 40 days in jail but Brent claims "turn my life around." Shortly after getting out of jail his father got ill with cancer and Brent spent a lot of time with him and was glad that they could reunite. The patient states Brent's currently doing well. Brent's under good control of his behavior. Brent's not particularly impulsive and is stable to stay focused. His mother states that Brent can't work full-time because of some onset something that upsets him Brent  becomes very depressed. For now however Brent sleeping and eating well and seems to be happy with his current girlfriend. Brent's had no further legal charges.  The patient returns after 2 months. His wife is with him. She is pregnant and the baby is due in 5 weeks. 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  Brent's not using any psychiatric medications is still want to touch base with me. Brent never use of guanfacine and didn't feel like Brent needed it. His temper is been under good control and his mood is good. Brent's not had any significant episodes of agitation.   History of Present Illness: Brent has been on Ritalin, Concerta, Focalin.  Brent has also been on Depakote, Abilify as well as Strattera. Allergies: Allergies  Allergen Reactions  . Strattera [Atomoxetine Hcl] Other (See Comments)    Became violent on Strattera.  . Bactrim [Sulfamethoxazole-Tmp Ds]   . Sulfonamide Derivatives Rash   Medical History: Past Medical History  Diagnosis Date  . ADHD (attention deficit hyperactivity disorder)   . Unspecified episodic mood disorder   . Acne   . Obsessive-compulsive disorder   . Oppositional defiant disorder    Surgical History: Past Surgical History  Procedure Laterality Date  . Tendon lengthening      leg tendon stretch as child  . Tendon lengthening      age 69  .  Tooth extraction     Family History: family history includes Alcohol abuse in his paternal aunt; Bipolar disorder in his cousin; Drug abuse in his paternal aunt. There is no history of Anxiety disorder, Dementia, Paranoid behavior, Physical abuse, Schizophrenia, Seizures, or Sexual abuse. Reviewed today in the office and nothing has changed.  Suicidal Ideation: No Plan Formed: No Patient has means to carry out plan: No  Homicidal Ideation: No Plan Formed: No Patient has means to carry out plan: No  Review of Systems: Psychiatric: Agitation: No Hallucination: No Depressed Mood: No Insomnia: No Hypersomnia: No Altered  Concentration: No Feels Worthless: No Grandiose Ideas: No Belief In Special Powers: No New/Increased Substance Abuse: No Compulsions: No  Neurologic: Headache: No Seizure: No Paresthesias: No  Past Medical Family, Social History: cHe has finished his course work at the [employer]. Brent is currently living with a friend Patient says that Brent's dating an 6 year old.  Family History family history includes Alcohol abuse in his paternal aunt; Bipolar disorder in his cousin; Drug abuse in his paternal aunt. There is no history of Anxiety disorder, Dementia, Paranoid behavior, Physical abuse, Schizophrenia, Seizures, or Sexual abuse.  Medications: Outpatient Encounter Prescriptions as of 11/24/2013  Medication Sig  . albuterol (PROVENTIL HFA;VENTOLIN HFA) 108 (90 BASE) MCG/ACT inhaler Inhale 2 puffs into the lungs every 6 (six) hours as needed for wheezing.  [facility] guanFACINE (TENEX) 2 MG tablet Take 1 tablet (2 mg total) by mouth at bedtime.  . naproxen (NAPROSYN) 500 MG tablet Take 1 tablet (500 mg total) by mouth 2 (two) times daily with a meal.    Past Psychiatric History/Hospitalization(s): Anxiety: No Bipolar Disorder: Yes Depression: Yes Mania: No Psychosis: No Schizophrenia: No Personality Disorder: No Hospitalization for psychiatric illness: Yes History of Electroconvulsive Shock Therapy: No Prior Suicide Attempts: No  Physical Exam: Constitutional:  BP 130/70  Ht 5\' 6"  (1.676 m)  Wt 181 lb (82.101 kg)  BMI 29.23 kg/m2  General Appearance: alert, oriented, no acute distress and well nourished  Musculoskeletal: Strength & Muscle Tone: within normal limits Gait & Station: normal Patient leans: N/A  Psychiatric: Speech (describe rate, volume, coherence, spontaneity, and abnormalities if any): Normal in volume rate and tone spontaneous  Thought Process (describe rate, content, abstract reasoning, and computation): Organized, goal-directed, age-appropriate  Associations:  Intact  Thoughts: normal  Mental Status: Orientation: oriented to person, place, time/date and situation Mood & Affect: normal affect Attention Span & Concentration: OK  Lab Results:  Results for orders placed in visit on 09/22/13 (from the past 8736 hour(s))  GLUCOSE, RANDOM   Collection Time    09/22/13 10:50 AM      Result Value Ref Range   Glucose, Bld 85  70 - 99 mg/dL  LIPID PANEL   Collection Time    09/22/13 10:50 AM      Result Value Ref Range   Cholesterol 240 (*) 0 - 200 mg/dL   Triglycerides 162 (*) <150 mg/dL   HDL 47  >39 mg/dL   Total CHOL/HDL Ratio 5.1     VLDL 32  0 - 40 mg/dL   LDL Cholesterol 161 (*) 0 - 99 mg/dL  BASIC METABOLIC PANEL   Collection Time    09/22/13 10:50 AM      Result Value Ref Range   Sodium 142  135 - 145 mEq/L   Potassium 4.7  3.5 - 5.3 mEq/L   Chloride 103  96 - 112 mEq/L   CO2 29  19 - 32  mEq/L   Glucose, Bld 85  70 - 99 mg/dL   BUN 19  6 - 23 mg/dL   Creat 0.66  0.50 - 1.35 mg/dL   Calcium 9.3  8.4 - 10.5 mg/dL  TSH   Collection Time    09/22/13 10:50 AM      Result Value Ref Range   TSH 0.972  0.350 - 4.500 uIU/mL  T4   Collection Time    09/22/13 10:50 AM      Result Value Ref Range   T4, Total 10.4  5.0 - 12.5 ug/dL  D-DIMER, QUANTITATIVE   Collection Time    09/22/13 10:50 AM      Result Value Ref Range   D-Dimer, Quant 0.27  0.00 - 0.48 ug/mL-FEU  Results for orders placed in visit on 04/24/13 (from the past 8736 hour(s))  PULMONARY FUNCTION TEST   Collection Time    05/06/13  3:14 PM      Result Value Ref Range   FEV1       FVC       FEV1/FVC       TLC       DLCO       PCP draws routine labs and nothing is emerging as of concern.  Assessment: Axis I: Mood disorder NOS, ADHD combined type    Axis II: Deferred  Axis III: Wears glasses, acne, Hx scabes  Axis IV: Moderate  Axis V: 65  Plan: I took his vitals.  I reviewed CC, tobacco/med/surg Hx, meds effects/ side effects, problem list,  therapies and responses as well as current situation/symptoms discussed options. Brent is totally off stimulants and to remain so as long as Brent is doing well. Brent will (guanfacine 2 mg prescription. Brent will use it if Brent needs to Brent'll return in 4 months See orders and pt instructions for more details.  MEDICATIONS this encounter: No orders of the defined types were placed in this encounter.   Medical Decision Making Problem Points:  Established problem, stable/improving (1), Review of last therapy session (1), Review of psycho-social stressors (1) and Self-limited or minor (1) Data Points:  Review or order clinical lab tests (1) Review of medication regiment & side effects (2)  I certify that outpatient services furnished can reasonably be expected to improve the patient's condition.   Levonne Spiller, MD

## 2014-01-05 ENCOUNTER — Encounter (HOSPITAL_COMMUNITY): Payer: Self-pay | Admitting: Psychology

## 2014-01-05 ENCOUNTER — Ambulatory Visit (INDEPENDENT_AMBULATORY_CARE_PROVIDER_SITE_OTHER): Payer: Medicare Other | Admitting: Psychology

## 2014-01-05 DIAGNOSIS — F39 Unspecified mood [affective] disorder: Secondary | ICD-10-CM

## 2014-01-05 NOTE — Progress Notes (Signed)
    PROGRESS NOTE  Patient:  [name]   DOB: November 28, 1990  MR Number: [id number]  Location: [facility] [address] [place] [address] Dept: [phone]  Start: 11 AM End: 12 PM  Provider/Observer:     Edgardo Roys PSYD  Chief Complaint:      Chief Complaint  Patient presents with  . Depression  . Stress  . Anxiety    Reason For Service:     The patient was referred by his treating psychiatrist Dr. Dwyane Dee, who felt that he needed to engage in some psychotherapeutic interventions and building some of his coping skills. The patient has been seen by Dr. Dwyane Dee through the [facility] mental health Department and then started seeing her here through [facility]. He is carried a number of diagnoses including conduct disorder with adolescent onset, attention deficit disorder with hyperactivity, and mood disorder NOS. He has been tried on a number of different medications including Concerta and Abilify as well as Depakote and Adderall. He does appear to be responding well to his current medications but there was a concern or interest in helping build more coping skills to do is reduced intellectual capacity and poor coping skills.  Interventions Strategy:  Cognitive/behavioral psychotherapy  Participation Level:   Active  Participation Quality:  Appropriate      Behavioral Observation:  Well Groomed, Alert, and Appropriate.   Current Psychosocial Factors: The patient reports that he and his wife have now had their baby and have gotten married.  They brought the child with them to office today.  He reports that while he has lost sleep this has gone well.  He also reportst that there were some issues at work and he had to quit his job before he was let go.  He reports that one worker stated claiming that he was "dating" another employee, which the patient denies, but his boss has an  issue with any such issues.  I suspect that there is more to this.  Content of Session:   Reviewed current symptoms and continue to work on building coping skills and strategies.  Current Status:   Patient is coping with a new born and a new wife.  He has lost his job but has restated food stamps and [employer].  His mother and aunt are helping.  Patient Progress:   Very good  Target Goals:   Reduce impulsive and maladaptive behaviors and engage in better use of coping skills.  Last Reviewed:   01/05/2014  Goals Addressed Today:    We continue to work on target goals of reducing the impulsive and manipulative behaviors and better at implementation of coping skills for judgment and future planning.  Impression/Diagnosis:   The patient has been diagnosed with a number of psychiatric conditions including mood disorder, attention deficit disorder, conduct disorder and other variations of these conditions. I do think that the most appropriate diagnosis at this time is one of attention deficit disorder combined type with the continued rule out of a mood disorder. I have not seen the patient in any significant fluctuation in mood state since I started seeing him. His mood is always been quite cheerful and pleasant with no indications of manic or hypomanic episodes or depressive episodes to this point.  Diagnosis:    Axis I:  Mood disorder      RODENBOUGH,JOHN R, PsyD 01/05/2014

## 2014-02-09 ENCOUNTER — Ambulatory Visit (HOSPITAL_COMMUNITY): Payer: Self-pay | Admitting: Psychology

## 2014-02-09 ENCOUNTER — Telehealth (HOSPITAL_COMMUNITY): Payer: Self-pay | Admitting: *Deleted

## 2014-02-09 ENCOUNTER — Ambulatory Visit (INDEPENDENT_AMBULATORY_CARE_PROVIDER_SITE_OTHER): Payer: Medicare Other | Admitting: Psychiatry

## 2014-02-09 ENCOUNTER — Encounter (HOSPITAL_COMMUNITY): Payer: Self-pay | Admitting: Psychiatry

## 2014-02-09 VITALS — BP 100/80 | Ht 66.0 in | Wt 182.0 lb

## 2014-02-09 DIAGNOSIS — F909 Attention-deficit hyperactivity disorder, unspecified type: Secondary | ICD-10-CM

## 2014-02-09 DIAGNOSIS — F39 Unspecified mood [affective] disorder: Secondary | ICD-10-CM

## 2014-02-09 MED ORDER — GUANFACINE HCL ER 3 MG PO TB24: 3.0000 mg | ORAL_TABLET | Freq: Every evening | ORAL | Status: DC

## 2014-02-09 MED ORDER — AMPHETAMINE-DEXTROAMPHET ER 30 MG PO CP24: 30.0000 mg | ORAL_CAPSULE | Freq: Every day | ORAL | Status: DC

## 2014-02-09 MED ORDER — GUANFACINE HCL 2 MG PO TABS: 2.0000 mg | ORAL_TABLET | Freq: Every day | ORAL | Status: DC

## 2014-02-09 NOTE — Progress Notes (Signed)
Patient ID: RAIN FRIEDT, male   DOB: 13-Apr-1991, 23 y.o.   MRN: [id number] Patient ID: JHONNIE ALIANO, male   DOB: [birth year], 23 y.o.   MRN: [id number] Patient ID: REMIGIO MCMILLON, male   DOB: [birth year], 23 y.o.   MRN: [id number] Patient ID: MERTON WADLOW, male   DOB: 02-22-1991, 23 y.o.   MRN: [id number] Patient ID: NYCHOLAS RAYNER, male   DOB: 08-19-1991, 23 y.o.   MRN: [id number] [facility] [address] Progress Note GENEVIEVE RITZEL MRN: [id number] DOB: November 27, 1990 Age: 23 y.o.  Date: 02/09/2014 Start Time: 2:55 PM End Time: 3:08 PM  Chief Complaint: Chief Complaint  Patient presents with  . Anxiety  . Depression  . 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   His mother reports that he was always a very hyperactive child wouldn't listen and acted out. He was constantly in trouble in school. He started on Ritalin at an early age but it made him like a zombie and he lost a lot of weight. Over the years she's been on numerous stimulants. At age 23 his father took custody of him. He did not do well with his father and was very oppositional. He was eventually placed in a group home and went through 3 group homes to his teen years.  At age 23 he was arrested for continued contributing to do the deliquenct of a minor. He was dating a 22 year old girl who ran away from home and was found with him he spent about 40 days in jail but he claims "turn my life around." Shortly after getting out of jail his father got ill with cancer and he spent a lot of time with him and was glad that they could reunite. The patient states he's currently doing well. He's under good control of his behavior. He's not particularly impulsive and is stable to stay focused. His mother states that he can't work full-time because of some onset  something that upsets him he becomes very depressed. For now however he sleeping and eating well and seems to be happy with his current girlfriend. He's had no further legal charges.  The patient returns after 2 months. Last time he told me he was doing better and stopped all his medications. However since then he's got into a crisis. 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 Now he and his wife are struggling financially. He did not get approved for disability. He's been very angry anxious and hyperactive. He can't sleep at night and he can't sit still during the day and focus. He's not been violent or suicidal but he would like to get back on the Adderall and guanfacine   History of Present Illness: He has been on Ritalin, Concerta, Focalin.  He has also been on Depakote, Abilify as well as Strattera. Allergies: Allergies  Allergen Reactions  . Strattera [Atomoxetine Hcl] Other (See Comments)    Became violent on Strattera.  . Bactrim [Sulfamethoxazole-Tmp Ds]   . Sulfonamide Derivatives Rash   Medical History: Past Medical History  Diagnosis Date  . ADHD (attention deficit hyperactivity disorder)   . Unspecified episodic mood disorder   . Acne   . Obsessive-compulsive disorder   .  Oppositional defiant disorder    Surgical History: Past Surgical History  Procedure Laterality Date  . Tendon lengthening      leg tendon stretch as child  . Tendon lengthening      age 41  . Tooth extraction     Family History: family history includes Alcohol abuse in his paternal aunt; Bipolar disorder in his cousin; Drug abuse in his paternal aunt. There is no history of Anxiety disorder, Dementia, Paranoid behavior, Physical abuse, Schizophrenia, Seizures, or Sexual abuse. Reviewed today in the office and nothing has changed.  Suicidal Ideation: No Plan Formed: No Patient has means to carry out plan:  No  Homicidal Ideation: No Plan Formed: No Patient has means to carry out plan: No  Review of Systems: Psychiatric: Agitation: No Hallucination: No Depressed Mood: No Insomnia: No Hypersomnia: No Altered Concentration: No Feels Worthless: No Grandiose Ideas: No Belief In Special Powers: No New/Increased Substance Abuse: No Compulsions: No  Neurologic: Headache: No Seizure: No Paresthesias: No  Past Medical Family, Social History: cHe has finished his course work at the [employer]. He is currently living with a friend Patient says that he's dating an 22 year old.  Family History family history includes Alcohol abuse in his paternal aunt; Bipolar disorder in his cousin; Drug abuse in his paternal aunt. There is no history of Anxiety disorder, Dementia, Paranoid behavior, Physical abuse, Schizophrenia, Seizures, or Sexual abuse.  Medications: Outpatient Encounter Prescriptions as of 02/09/2014  Medication Sig  . albuterol (PROVENTIL HFA;VENTOLIN HFA) 108 (90 BASE) MCG/ACT inhaler Inhale 2 puffs into the lungs every 6 (six) hours as needed for wheezing.  Marland Kitchen amphetamine-dextroamphetamine (ADDERALL XR) 30 MG 24 hr capsule Take 1 capsule (30 mg total) by mouth daily.  Marland Kitchen amphetamine-dextroamphetamine (ADDERALL XR) 30 MG 24 hr capsule Take 1 capsule (30 mg total) by mouth daily.  Marland Kitchen guanFACINE (TENEX) 2 MG tablet Take 1 tablet (2 mg total) by mouth at bedtime.  . GuanFACINE HCl 3 MG TB24 Take 1 tablet (3 mg total) by mouth Nightly.  . naproxen (NAPROSYN) 500 MG tablet Take 1 tablet (500 mg total) by mouth 2 (two) times daily with a meal.  . [DISCONTINUED] amphetamine-dextroamphetamine (ADDERALL XR) 30 MG 24 hr capsule Take 1 capsule (30 mg total) by mouth daily.  . [DISCONTINUED] guanFACINE (TENEX) 2 MG tablet Take 1 tablet (2 mg total) by mouth at bedtime.    Past Psychiatric History/Hospitalization(s): Anxiety: No Bipolar Disorder: Yes Depression: Yes Mania: No Psychosis:  No Schizophrenia: No Personality Disorder: No Hospitalization for psychiatric illness: Yes History of Electroconvulsive Shock Therapy: No Prior Suicide Attempts: No  Physical Exam: Constitutional:  BP 100/80  Ht 5\' 6"  (1.676 m)  Wt 182 lb (82.555 kg)  BMI 29.39 kg/m2  General Appearance alert oriented but fidgety and somewhat irritated  Musculoskeletal: Strength & Muscle Tone: within normal limits Gait & Station: normal Patient leans: N/A  Psychiatric: Speech (describe rate, volume, coherence, spontaneity, and abnormalities if any): Normal in volume rate and tone spontaneous  Thought Process (describe rate, content, abstract reasoning, and computation): Organized, goal-directed, age-appropriate  Associations: Intact  Thoughts: normal  Mental Status: Orientation: oriented to person, place, time/date and situation Mood & Affect: Irritable Attention Span & Concentration: Poor  Lab Results:  Results for orders placed in visit on 09/22/13 (from the past 8736 hour(s))  GLUCOSE, RANDOM   Collection Time    09/22/13 10:50 AM      Result Value Ref Range   Glucose, Bld 85  70 -  99 mg/dL  LIPID PANEL   Collection Time    09/22/13 10:50 AM      Result Value Ref Range   Cholesterol 240 (*) 0 - 200 mg/dL   Triglycerides 162 (*) <150 mg/dL   HDL 47  >39 mg/dL   Total CHOL/HDL Ratio 5.1     VLDL 32  0 - 40 mg/dL   LDL Cholesterol 161 (*) 0 - 99 mg/dL  BASIC METABOLIC PANEL   Collection Time    09/22/13 10:50 AM      Result Value Ref Range   Sodium 142  135 - 145 mEq/L   Potassium 4.7  3.5 - 5.3 mEq/L   Chloride 103  96 - 112 mEq/L   CO2 29  19 - 32 mEq/L   Glucose, Bld 85  70 - 99 mg/dL   BUN 19  6 - 23 mg/dL   Creat 0.66  0.50 - 1.35 mg/dL   Calcium 9.3  8.4 - 10.5 mg/dL  TSH   Collection Time    09/22/13 10:50 AM      Result Value Ref Range   TSH 0.972  0.350 - 4.500 uIU/mL  T4   Collection Time    09/22/13 10:50 AM      Result Value Ref Range   T4, Total  10.4  5.0 - 12.5 ug/dL  D-DIMER, QUANTITATIVE   Collection Time    09/22/13 10:50 AM      Result Value Ref Range   D-Dimer, Quant 0.27  0.00 - 0.48 ug/mL-FEU  Results for orders placed in visit on 04/24/13 (from the past 8736 hour(s))  PULMONARY FUNCTION TEST   Collection Time    05/06/13  3:14 PM      Result Value Ref Range   FEV1       FVC       FEV1/FVC       TLC       DLCO       PCP draws routine labs and nothing is emerging as of concern.  Assessment: Axis I: Mood disorder NOS, ADHD combined type    Axis II: Deferred  Axis III: Wears glasses, acne, Hx scabes  Axis IV: Moderate  Axis V: 65  Plan: I took his vitals.  I reviewed CC, tobacco/med/surg Hx, meds effects/ side effects, problem list, therapies and responses as well as current situation/symptoms discussed options. Since he is having symptoms of ADHD and anger spells again we'll restart Adderall XR 30 mg every morning and guanfacine 3 mg each bedtime. He'll return in 2 months See orders and pt instructions for more details.  MEDICATIONS this encounter: Meds ordered this encounter  Medications  . guanFACINE (TENEX) 2 MG tablet    Sig: Take 1 tablet (2 mg total) by mouth at bedtime.    Dispense:  30 tablet    Refill:  2  . GuanFACINE HCl 3 MG TB24    Sig: Take 1 tablet (3 mg total) by mouth Nightly.    Dispense:  30 tablet    Refill:  2  . DISCONTD: amphetamine-dextroamphetamine (ADDERALL XR) 30 MG 24 hr capsule    Sig: Take 1 capsule (30 mg total) by mouth daily.    Dispense:  30 capsule    Refill:  0  . amphetamine-dextroamphetamine (ADDERALL XR) 30 MG 24 hr capsule    Sig: Take 1 capsule (30 mg total) by mouth daily.    Dispense:  30 capsule    Refill:  0  .  amphetamine-dextroamphetamine (ADDERALL XR) 30 MG 24 hr capsule    Sig: Take 1 capsule (30 mg total) by mouth daily.    Dispense:  30 capsule    Refill:  0    Do not fill before 03/11/14   Medical Decision Making Problem Points:  Established  problem, stable/improving (1), Review of last therapy session (1), Review of psycho-social stressors (1) and Self-limited or minor (1) Data Points:  Review or order clinical lab tests (1) Review of medication regiment & side effects (2)  I certify that outpatient services furnished can reasonably be expected to improve the patient's condition.   Levonne Spiller, MD

## 2014-02-09 NOTE — Telephone Encounter (Signed)
Needs to be seen

## 2014-02-09 NOTE — Telephone Encounter (Signed)
Needs to come in for appt

## 2014-03-16 ENCOUNTER — Ambulatory Visit (INDEPENDENT_AMBULATORY_CARE_PROVIDER_SITE_OTHER): Payer: Self-pay | Admitting: Psychology

## 2014-03-16 ENCOUNTER — Encounter (HOSPITAL_COMMUNITY): Payer: Self-pay | Admitting: Psychology

## 2014-03-16 DIAGNOSIS — F39 Unspecified mood [affective] disorder: Secondary | ICD-10-CM

## 2014-03-16 NOTE — Progress Notes (Signed)
    PROGRESS NOTE  Patient:  [name]   DOB: November 01, 1990  MR Number: [id number]  Location: [facility] [address] [place] [address] Dept: [phone]  Start: 11 AM End: 12 PM  Provider/Observer:     Edgardo Roys PSYD  Chief Complaint:      Chief Complaint  Patient presents with  . Agitation  . Depression  . Stress    Reason For Service:     The patient was referred by his treating psychiatrist Dr. Dwyane Dee, who felt that he needed to engage in some psychotherapeutic interventions and building some of his coping skills. The patient has been seen by Dr. Dwyane Dee through the [facility] and then started seeing her here through [facility]. He is carried a number of diagnoses including conduct disorder with adolescent onset, attention deficit disorder with hyperactivity, and mood disorder NOS. He has been tried on a number of different medications including Concerta and Abilify as well as Depakote and Adderall. He does appear to be responding well to his current medications but there was a concern or interest in helping build more coping skills to do is reduced intellectual capacity and poor coping skills.  Interventions Strategy:  Cognitive/behavioral psychotherapy  Participation Level:   Active  Participation Quality:  Appropriate      Behavioral Observation:  Well Groomed, Alert, and Appropriate.   Current Psychosocial Factors: The patient reports that he and his wife have moved from house due to black mold.  He is now with wife's grandmother.  Patient has not been able to get meds refiled due to lose of medicade.    Content of Session:   Reviewed current symptoms and continue to work on building coping skills and strategies.  Current Status:   Patient is coping with a new born and a new wife.  He has lost his job but has restated food stamps and  [place].  Still getting help from others.  Has maintained fairly stable mood.  Patient Progress:   Very good  Target Goals:   Reduce impulsive and maladaptive behaviors and engage in better use of coping skills.  Last Reviewed:   03/16/2014  Goals Addressed Today:    We continue to work on target goals of reducing the impulsive and manipulative behaviors and better at implementation of coping skills for judgment and future planning.  Impression/Diagnosis:   The patient has been diagnosed with a number of psychiatric conditions including mood disorder, attention deficit disorder, conduct disorder and other variations of these conditions. I do think that the most appropriate diagnosis at this time is one of attention deficit disorder combined type with the continued rule out of a mood disorder. I have not seen the patient in any significant fluctuation in mood state since I started seeing him. His mood is always been quite cheerful and pleasant with no indications of manic or hypomanic episodes or depressive episodes to this point.  Diagnosis:    Axis I:  Mood disorder      RODENBOUGH,JOHN R, PsyD 03/16/2014

## 2014-03-25 ENCOUNTER — Ambulatory Visit (HOSPITAL_COMMUNITY): Payer: Self-pay | Admitting: Psychiatry

## 2014-03-26 ENCOUNTER — Ambulatory Visit (HOSPITAL_COMMUNITY): Payer: Self-pay | Admitting: Psychiatry

## 2014-04-01 ENCOUNTER — Encounter (HOSPITAL_COMMUNITY): Payer: Self-pay | Admitting: Psychiatry

## 2014-04-01 ENCOUNTER — Ambulatory Visit (INDEPENDENT_AMBULATORY_CARE_PROVIDER_SITE_OTHER): Payer: Self-pay | Admitting: Psychiatry

## 2014-04-01 VITALS — BP 121/77 | HR 86 | Ht 66.0 in | Wt 181.6 lb

## 2014-04-01 DIAGNOSIS — F909 Attention-deficit hyperactivity disorder, unspecified type: Secondary | ICD-10-CM

## 2014-04-01 DIAGNOSIS — F39 Unspecified mood [affective] disorder: Secondary | ICD-10-CM

## 2014-04-01 MED ORDER — AMPHETAMINE-DEXTROAMPHET ER 30 MG PO CP24: 30.0000 mg | ORAL_CAPSULE | Freq: Every day | ORAL | Status: DC

## 2014-04-01 MED ORDER — GUANFACINE HCL ER 3 MG PO TB24: 3.0000 mg | ORAL_TABLET | Freq: Every evening | ORAL | Status: DC

## 2014-04-01 NOTE — Progress Notes (Signed)
Patient ID: [name], male   DOB: 02-28-1991, 23 y.o.   MRN: [id number] Patient ID: RYANE CANAVAN, male   DOB: 04/05/1991, 23 y.o.   MRN: [id number] Patient ID: KOLBEE BOGUSZ, male   DOB: 1991/01/24, 23 y.o.   MRN: [id number] Patient ID: JOSEGUADALUPE STAN, male   DOB: 11-13-1990, 23 y.o.   MRN: [id number] Patient ID: ZIMERE DUNLEVY, male   DOB: 1990/11/19, 23 y.o.   MRN: [id number] Patient ID: RAYDEL HOSICK, male   DOB: [birth year], 23 y.o.   MRN: [id number] [facility] [address] Progress Note EWAN GRAU MRN: [id number] DOB: 05-Sep-1990 Age: 23 y.o.  Date: 04/01/2014 Start Time: 2:55 PM End Time: 3:08 PM  Chief Complaint: Chief Complaint  Patient presents with  . Anxiety  . ADHD  . 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   His mother reports that he was always a very hyperactive child wouldn't listen and acted out. He was constantly in trouble in school. He started on Ritalin at an early age but it made him like a zombie and he lost a lot of weight. Over the years she's been on numerous stimulants. At age 61 his father took custody of him. He did not do well with his father and was very oppositional. He was eventually placed in a group home and went through 3 group homes to his teen years.  At age 37 he was arrested for continued contributing to do the deliquenct of a minor. He was dating a 22 year old girl who ran away from home and was found with him he spent about 40 days in jail but he claims "turn my life around." Shortly after getting out of jail his father got ill with cancer and he spent a lot of time with him and was glad that they could reunite. The patient states he's currently doing well. He's under good control of his behavior. He's not particularly impulsive and is stable to stay focused.  His mother states that he can't work full-time because of some onset something that upsets him he becomes very depressed. For now however he sleeping and eating well and seems to be happy with his current girlfriend. He's had no further legal charges.  The patient returns after 2 months. He is here with his wife and baby. He's not been able to find work and they have lost their apartment. They started to stay with wife's grandmother but she got angry with the patient asked him to leave. Right now his mother is paying for a hotel stay. The patient lost his disability and currently does not have health insurance but will be getting united healthcare soon. He is gotten off his medications because he cannot pay for them. He would like to restart guanfacine and Adderall XR and I have given him new prescriptions today. He denies suicidal or homicidal ideation. He is actively pursuing work and housing   History of Present Illness: He has been on Ritalin, Concerta, Focalin.  He has also been on Depakote, Abilify as well as Strattera. Allergies: Allergies  Allergen Reactions  . Strattera [Atomoxetine Hcl] Other (See Comments)    Became violent on Strattera.  . Bactrim [Sulfamethoxazole-Tmp Ds]   . Sulfonamide Derivatives Rash   Medical History: Past Medical History  Diagnosis Date  .  ADHD (attention deficit hyperactivity disorder)   . Unspecified episodic mood disorder   . Acne   . Obsessive-compulsive disorder   . Oppositional defiant disorder    Surgical History: Past Surgical History  Procedure Laterality Date  . Tendon lengthening      leg tendon stretch as child  . Tendon lengthening      age 26  . Tooth extraction     Family History: family history includes Alcohol abuse in his paternal aunt; Bipolar disorder in his cousin; Drug abuse in his paternal aunt. There is no history of Anxiety disorder, Dementia, Paranoid behavior, Physical abuse, Schizophrenia, Seizures, or Sexual  abuse. Reviewed today in the office and nothing has changed.  Suicidal Ideation: No Plan Formed: No Patient has means to carry out plan: No  Homicidal Ideation: No Plan Formed: No Patient has means to carry out plan: No  Review of Systems: Psychiatric: Agitation: No Hallucination: No Depressed Mood: No Insomnia: No Hypersomnia: No Altered Concentration: No Feels Worthless: No Grandiose Ideas: No Belief In Special Powers: No New/Increased Substance Abuse: No Compulsions: No  Neurologic: Headache: No Seizure: No Paresthesias: No  Past Medical Family, Social History: cHe has finished his course work at the [employer]. He is currently living with a friend Patient says that he's dating an 66 year old.  Family History family history includes Alcohol abuse in his paternal aunt; Bipolar disorder in his cousin; Drug abuse in his paternal aunt. There is no history of Anxiety disorder, Dementia, Paranoid behavior, Physical abuse, Schizophrenia, Seizures, or Sexual abuse.  Medications: Outpatient Encounter Prescriptions as of 04/01/2014  Medication Sig  . albuterol (PROVENTIL HFA;VENTOLIN HFA) 108 (90 BASE) MCG/ACT inhaler Inhale 2 puffs into the lungs every 6 (six) hours as needed for wheezing.  Marland Kitchen amphetamine-dextroamphetamine (ADDERALL XR) 30 MG 24 hr capsule Take 1 capsule (30 mg total) by mouth daily.  . GuanFACINE HCl 3 MG TB24 Take 1 tablet (3 mg total) by mouth Nightly.  . naproxen (NAPROSYN) 500 MG tablet Take 500 mg by mouth 2 (two) times daily as needed.  . [DISCONTINUED] amphetamine-dextroamphetamine (ADDERALL XR) 30 MG 24 hr capsule Take 1 capsule (30 mg total) by mouth daily.  . [DISCONTINUED] GuanFACINE HCl 3 MG TB24 Take 1 tablet (3 mg total) by mouth Nightly.  . [DISCONTINUED] naproxen (NAPROSYN) 500 MG tablet Take 1 tablet (500 mg total) by mouth 2 (two) times daily with a meal.  . [DISCONTINUED] amphetamine-dextroamphetamine (ADDERALL XR) 30 MG 24 hr capsule Take 1  capsule (30 mg total) by mouth daily.  . [DISCONTINUED] guanFACINE (TENEX) 2 MG tablet Take 1 tablet (2 mg total) by mouth at bedtime.    Past Psychiatric History/Hospitalization(s): Anxiety: No Bipolar Disorder: Yes Depression: Yes Mania: No Psychosis: No Schizophrenia: No Personality Disorder: No Hospitalization for psychiatric illness: Yes History of Electroconvulsive Shock Therapy: No Prior Suicide Attempts: No  Physical Exam: Constitutional:  BP 121/77  Pulse 86  Ht 5\' 6"  (1.676 m)  Wt 181 lb 9.6 oz (82.373 kg)  BMI 29.32 kg/m2  General Appearance alert oriented but fidgety and somewhat irritated  Musculoskeletal: Strength & Muscle Tone: within normal limits Gait & Station: normal Patient leans: N/A  Psychiatric: Speech (describe rate, volume, coherence, spontaneity, and abnormalities if any): Normal in volume rate and tone spontaneous  Thought Process (describe rate, content, abstract reasoning, and computation): Organized, goal-directed, age-appropriate  Associations: Intact  Thoughts: normal  Mental Status: Orientation: oriented to person, place, time/date and situation Mood & Affect: Irritable Attention Span &  Concentration: Poor  Lab Results:  Results for orders placed in visit on 09/22/13 (from the past 8736 hour(s))  GLUCOSE, RANDOM   Collection Time    09/22/13 10:50 AM      Result Value Ref Range   Glucose, Bld 85  70 - 99 mg/dL  LIPID PANEL   Collection Time    09/22/13 10:50 AM      Result Value Ref Range   Cholesterol 240 (*) 0 - 200 mg/dL   Triglycerides 162 (*) <150 mg/dL   HDL 47  >39 mg/dL   Total CHOL/HDL Ratio 5.1     VLDL 32  0 - 40 mg/dL   LDL Cholesterol 161 (*) 0 - 99 mg/dL  BASIC METABOLIC PANEL   Collection Time    09/22/13 10:50 AM      Result Value Ref Range   Sodium 142  135 - 145 mEq/L   Potassium 4.7  3.5 - 5.3 mEq/L   Chloride 103  96 - 112 mEq/L   CO2 29  19 - 32 mEq/L   Glucose, Bld 85  70 - 99 mg/dL   BUN 19   6 - 23 mg/dL   Creat 0.66  0.50 - 1.35 mg/dL   Calcium 9.3  8.4 - 10.5 mg/dL  TSH   Collection Time    09/22/13 10:50 AM      Result Value Ref Range   TSH 0.972  0.350 - 4.500 uIU/mL  T4   Collection Time    09/22/13 10:50 AM      Result Value Ref Range   T4, Total 10.4  5.0 - 12.5 ug/dL  D-DIMER, QUANTITATIVE   Collection Time    09/22/13 10:50 AM      Result Value Ref Range   D-Dimer, Quant 0.27  0.00 - 0.48 ug/mL-FEU  Results for orders placed in visit on 04/24/13 (from the past 8736 hour(s))  PULMONARY FUNCTION TEST   Collection Time    05/06/13  3:14 PM      Result Value Ref Range   FEV1       FVC       FEV1/FVC       TLC       DLCO       PCP draws routine labs and nothing is emerging as of concern.  Assessment: Axis I: Mood disorder NOS, ADHD combined type    Axis II: Deferred  Axis III: Wears glasses, acne, Hx scabes  Axis IV: Moderate  Axis V: 65  Plan: I took his vitals.  I reviewed CC, tobacco/med/surg Hx, meds effects/ side effects, problem list, therapies and responses as well as current situation/symptoms discussed options. Since he is having symptoms of ADHD and anger spells again we'll restart Adderall XR 30 mg every morning and guanfacine 3 mg each bedtime. He'll return in one month See orders and pt instructions for more details.  MEDICATIONS this encounter: Meds ordered this encounter  Medications  . naproxen (NAPROSYN) 500 MG tablet    Sig: Take 500 mg by mouth 2 (two) times daily as needed.  . GuanFACINE HCl 3 MG TB24    Sig: Take 1 tablet (3 mg total) by mouth Nightly.    Dispense:  30 tablet    Refill:  2  . amphetamine-dextroamphetamine (ADDERALL XR) 30 MG 24 hr capsule    Sig: Take 1 capsule (30 mg total) by mouth daily.    Dispense:  30 capsule    Refill:  0  Medical Decision Making Problem Points:  Established problem, stable/improving (1), Review of last therapy session (1), Review of psycho-social stressors (1) and Self-limited  or minor (1) Data Points:  Review or order clinical lab tests (1) Review of medication regiment & side effects (2)  I certify that outpatient services furnished can reasonably be expected to improve the patient's condition.   Levonne Spiller, MD

## 2014-04-27 ENCOUNTER — Other Ambulatory Visit (HOSPITAL_COMMUNITY): Payer: Self-pay | Admitting: Psychiatry

## 2014-04-27 ENCOUNTER — Telehealth (HOSPITAL_COMMUNITY): Payer: Self-pay | Admitting: *Deleted

## 2014-04-27 NOTE — Telephone Encounter (Signed)
Stop med until seen

## 2014-04-27 NOTE — Telephone Encounter (Signed)
pt calling in stating he's been having headaches since he started back his Adderall. Pt states he have been taking medication for 7 days now. Pt states he's been very dizzy and would like advise and a call back [phone]

## 2014-05-04 ENCOUNTER — Encounter (HOSPITAL_COMMUNITY): Payer: Self-pay | Admitting: Psychiatry

## 2014-05-04 ENCOUNTER — Ambulatory Visit (INDEPENDENT_AMBULATORY_CARE_PROVIDER_SITE_OTHER): Payer: 59 | Admitting: Psychiatry

## 2014-05-04 VITALS — BP 143/85 | HR 95 | Ht 66.0 in | Wt 180.6 lb

## 2014-05-04 DIAGNOSIS — F909 Attention-deficit hyperactivity disorder, unspecified type: Secondary | ICD-10-CM

## 2014-05-04 DIAGNOSIS — F39 Unspecified mood [affective] disorder: Secondary | ICD-10-CM

## 2014-05-04 MED ORDER — LISDEXAMFETAMINE DIMESYLATE 40 MG PO CAPS: 40.0000 mg | ORAL_CAPSULE | ORAL | Status: DC

## 2014-05-04 NOTE — Progress Notes (Signed)
Patient ID: SYLER NORCIA, male   DOB: 1990/12/15, 23 y.o.   MRN: [id number] Patient ID: ILIJAH DOUCET, male   DOB: 03-04-1991, 23 y.o.   MRN: [id number] Patient ID: KIMBERLEY DASTRUP, male   DOB: 1990-10-03, 23 y.o.   MRN: [id number] Patient ID: RUTH TULLY, male   DOB: 18-Mar-1991, 23 y.o.   MRN: [id number] Patient ID: JEREMIYAH CULLENS, male   DOB: February 15, 1991, 23 y.o.   MRN: [id number] Patient ID: ADIEL ERNEY, male   DOB: 03/01/1991, 23 y.o.   MRN: [id number] Patient ID: HAZEL WRINKLE, male   DOB: 1991/04/22, 23 y.o.   MRN: [id number] [facility] [address] Progress Note LANDY DUNNAVANT MRN: [id number] DOB: February 19, 1991 Age: 23 y.o.  Date: 05/04/2014 Start Time: 2:55 PM End Time: 3:08 PM  Chief Complaint: Chief Complaint  Patient presents with  . ADHD  . Agitation  . 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   His mother reports that he was always a very hyperactive child wouldn't listen and acted out. He was constantly in trouble in school. He started on Ritalin at an early age but it made him like a zombie and he lost a lot of weight. Over the years she's been on numerous stimulants. At age 33 his father took custody of him. He did not do well with his father and was very oppositional. He was eventually placed in a group home and went through 3 group homes to his teen years.  At age 75 he was arrested for continued contributing to do the deliquenct of a minor. He was dating a 22 year old girl who ran away from home and was found with him he spent about 40 days in jail but he claims "turn my life around." Shortly after getting out of jail his father got ill with cancer and he spent a lot of time with him and was glad that they could reunite. The patient states he's currently doing well. He's under  good control of his behavior. He's not particularly impulsive and is stable to stay focused. His mother states that he can't work full-time because of some onset something that upsets him he becomes very depressed. For now however he sleeping and eating well and seems to be happy with his current girlfriend. He's had no further legal charges.  The patient returns after one month. He is no longer homeless. His mother and grandmother have helped him his wife and baby in a trailer. He is trying to get his disability reinstated. He tried to go back on Adderall XR 30 mg it made him feel sick with nausea and headache. I told him to stop it immediately. He has never tried Vyvanse and he wants to get back on something to help him focus because he is thinking of going to college in the winter.   History of Present Illness: He has been on Ritalin, Concerta, Focalin.  He has also been on Depakote, Abilify as well as Strattera. Allergies: Allergies  Allergen Reactions  . Strattera [Atomoxetine Hcl] Other (See Comments)    Became violent on Strattera.  . Bactrim [Sulfamethoxazole-Tmp Ds]   . Sulfonamide Derivatives Rash   Medical History: Past Medical History  Diagnosis Date  . ADHD (attention deficit hyperactivity disorder)   . Unspecified episodic  mood disorder   . Acne   . Obsessive-compulsive disorder   . Oppositional defiant disorder    Surgical History: Past Surgical History  Procedure Laterality Date  . Tendon lengthening      leg tendon stretch as child  . Tendon lengthening      age 63  . Tooth extraction     Family History: family history includes Alcohol abuse in his paternal aunt; Bipolar disorder in his cousin; Drug abuse in his paternal aunt. There is no history of Anxiety disorder, Dementia, Paranoid behavior, Physical abuse, Schizophrenia, Seizures, or Sexual abuse. Reviewed today in the office and nothing has changed.  Suicidal Ideation: No Plan Formed: No Patient has means to  carry out plan: No  Homicidal Ideation: No Plan Formed: No Patient has means to carry out plan: No  Review of Systems: Psychiatric: Agitation: No Hallucination: No Depressed Mood: No Insomnia: No Hypersomnia: No Altered Concentration: No Feels Worthless: No Grandiose Ideas: No Belief In Special Powers: No New/Increased Substance Abuse: No Compulsions: No  Neurologic: Headache: No Seizure: No Paresthesias: No  Past Medical Family, Social History: cHe has finished his course work at the [employer]. He is currently living with a friend Patient says that he's dating an 37 year old.  Family History family history includes Alcohol abuse in his paternal aunt; Bipolar disorder in his cousin; Drug abuse in his paternal aunt. There is no history of Anxiety disorder, Dementia, Paranoid behavior, Physical abuse, Schizophrenia, Seizures, or Sexual abuse.  Medications: Outpatient Encounter Prescriptions as of 05/04/2014  Medication Sig  . albuterol (PROVENTIL HFA;VENTOLIN HFA) 108 (90 BASE) MCG/ACT inhaler Inhale 2 puffs into the lungs every 6 (six) hours as needed for wheezing.  . naproxen (NAPROSYN) 500 MG tablet Take 500 mg by mouth 2 (two) times daily as needed.  . [DISCONTINUED] amphetamine-dextroamphetamine (ADDERALL XR) 30 MG 24 hr capsule Take 1 capsule (30 mg total) by mouth daily.  Marland Kitchen lisdexamfetamine (VYVANSE) 40 MG capsule Take 1 capsule (40 mg total) by mouth every morning.  . [DISCONTINUED] GuanFACINE HCl 3 MG TB24 Take 1 tablet (3 mg total) by mouth Nightly.    Past Psychiatric History/Hospitalization(s): Anxiety: No Bipolar Disorder: Yes Depression: Yes Mania: No Psychosis: No Schizophrenia: No Personality Disorder: No Hospitalization for psychiatric illness: Yes History of Electroconvulsive Shock Therapy: No Prior Suicide Attempts: No  Physical Exam: Constitutional:  BP 143/85  Pulse 95  Ht 5\' 6"  (1.676 m)  Wt 180 lb 9.6 oz (81.92 kg)  BMI 29.16  kg/m2  General Appearance alert oriented fidgety and hypertalkative  Musculoskeletal: Strength & Muscle Tone: within normal limits Gait & Station: normal Patient leans: N/A  Psychiatric: Speech (describe rate, volume, coherence, spontaneity, and abnormalities if any): Normal in volume rate and tone spontaneous  Thought Process (describe rate, content, abstract reasoning, and computation): Organized, goal-directed, age-appropriate  Associations: Intact  Thoughts: normal  Mental Status: Orientation: oriented to person, place, time/date and situation Mood & Affect: Irritable Attention Span & Concentration: Poor  Lab Results:  Results for orders placed in visit on 09/22/13 (from the past 8736 hour(s))  GLUCOSE, RANDOM   Collection Time    09/22/13 10:50 AM      Result Value Ref Range   Glucose, Bld 85  70 - 99 mg/dL  LIPID PANEL   Collection Time    09/22/13 10:50 AM      Result Value Ref Range   Cholesterol 240 (*) 0 - 200 mg/dL   Triglycerides 162 (*) <150 mg/dL  HDL 47  >39 mg/dL   Total CHOL/HDL Ratio 5.1     VLDL 32  0 - 40 mg/dL   LDL Cholesterol 161 (*) 0 - 99 mg/dL  BASIC METABOLIC PANEL   Collection Time    09/22/13 10:50 AM      Result Value Ref Range   Sodium 142  135 - 145 mEq/L   Potassium 4.7  3.5 - 5.3 mEq/L   Chloride 103  96 - 112 mEq/L   CO2 29  19 - 32 mEq/L   Glucose, Bld 85  70 - 99 mg/dL   BUN 19  6 - 23 mg/dL   Creat 0.66  0.50 - 1.35 mg/dL   Calcium 9.3  8.4 - 10.5 mg/dL  TSH   Collection Time    09/22/13 10:50 AM      Result Value Ref Range   TSH 0.972  0.350 - 4.500 uIU/mL  T4   Collection Time    09/22/13 10:50 AM      Result Value Ref Range   T4, Total 10.4  5.0 - 12.5 ug/dL  D-DIMER, QUANTITATIVE   Collection Time    09/22/13 10:50 AM      Result Value Ref Range   D-Dimer, Quant 0.27  0.00 - 0.48 ug/mL-FEU  Results for orders placed in visit on 04/24/13 (from the past 8736 hour(s))  PULMONARY FUNCTION TEST   Collection  Time    05/06/13  3:14 PM      Result Value Ref Range   FEV1       FVC       FEV1/FVC       TLC       DLCO       PCP draws routine labs and nothing is emerging as of concern.  Assessment: Axis I: Mood disorder NOS, ADHD combined type    Axis II: Deferred  Axis III: Wears glasses, acne, Hx scabes  Axis IV: Moderate  Axis V: 65  Plan: I took his vitals.  I reviewed CC, tobacco/med/surg Hx, meds effects/ side effects, problem list, therapies and responses as well as current situation/symptoms discussed options. He will start Vyvanse 40 mg every morning  He'll return in one month See orders and pt instructions for more details.  MEDICATIONS this encounter: Meds ordered this encounter  Medications  . lisdexamfetamine (VYVANSE) 40 MG capsule    Sig: Take 1 capsule (40 mg total) by mouth every morning.    Dispense:  30 capsule    Refill:  0   Medical Decision Making Problem Points:  Established problem, stable/improving (1), Review of last therapy session (1), Review of psycho-social stressors (1) and Self-limited or minor (1) Data Points:  Review or order clinical lab tests (1) Review of medication regiment & side effects (2)  I certify that outpatient services furnished can reasonably be expected to improve the patient's condition.   Levonne Spiller, MD

## 2014-05-14 ENCOUNTER — Telehealth (HOSPITAL_COMMUNITY): Payer: Self-pay | Admitting: *Deleted

## 2014-05-14 ENCOUNTER — Encounter (HOSPITAL_COMMUNITY): Payer: Self-pay | Admitting: *Deleted

## 2014-05-14 ENCOUNTER — Encounter (HOSPITAL_COMMUNITY): Payer: Self-pay | Admitting: Psychology

## 2014-05-14 ENCOUNTER — Ambulatory Visit (INDEPENDENT_AMBULATORY_CARE_PROVIDER_SITE_OTHER): Payer: 59 | Admitting: Psychology

## 2014-05-14 DIAGNOSIS — F39 Unspecified mood [affective] disorder: Secondary | ICD-10-CM

## 2014-05-14 DIAGNOSIS — F909 Attention-deficit hyperactivity disorder, unspecified type: Secondary | ICD-10-CM

## 2014-05-14 DIAGNOSIS — F429 Obsessive-compulsive disorder, unspecified: Secondary | ICD-10-CM

## 2014-05-14 NOTE — Progress Notes (Signed)
Prior Auth Vyvanse 40 mg Approval number [id number] Approved from 05-14-14 to 05-15-15

## 2014-05-14 NOTE — Progress Notes (Signed)
    PROGRESS NOTE  Patient:  [name]   DOB: 12-Nov-1990  MR Number: [id number]  Location: [facility] [address] [place] [address] Dept: [phone]  Start: 1 PM End: 2 PM  Provider/Observer:     Edgardo Roys PSYD  Chief Complaint:      Chief Complaint  Patient presents with  . Agitation  . ADHD  . Depression  . Anxiety  . Stress    Reason For Service:     The patient was referred by his treating psychiatrist Dr. Dwyane Dee, who felt that he needed to engage in some psychotherapeutic interventions and building some of his coping skills. The patient has been seen by Dr. Dwyane Dee through the [facility] mental health Department and then started seeing her here through [facility]. He is carried a number of diagnoses including conduct disorder with adolescent onset, attention deficit disorder with hyperactivity, and mood disorder NOS. He has been tried on a number of different medications including Concerta and Abilify as well as Depakote and Adderall. He does appear to be responding well to his current medications but there was a concern or interest in helping build more coping skills to do is reduced intellectual capacity and poor coping skills.  Interventions Strategy:  Cognitive/behavioral psychotherapy  Participation Level:   Active  Participation Quality:  Appropriate      Behavioral Observation:  Well Groomed, Alert, and Appropriate.   Current Psychosocial Factors: The patient reports that he and his wife have found a place to live that costs 250.00.  The patient has not been able to keep emotional stability long enough look for job or maintain job.  The patient reports episodes of depression.    Content of Session:   Reviewed current symptoms and continue to work on building coping skills and strategies.  Current Status:   Patient is coping with a new born and a new  wife.  He impulsively lost his job and had to get assistance such as restated food stamps and [place].  Still getting help from others.  Has maintained fairly stable mood with episodic periods of significant depression.  Patient Progress:   Stable  Target Goals:   Reduce impulsive and maladaptive behaviors and engage in better use of coping skills.  Last Reviewed:   05/14/2014  Goals Addressed Today:    We continue to work on target goals of reducing the impulsive and manipulative behaviors and better at implementation of coping skills for judgment and future planning.  Impression/Diagnosis:   The patient has been diagnosed with a number of psychiatric conditions including mood disorder, attention deficit disorder, conduct disorder and other variations of these conditions. I do think that the most appropriate diagnosis at this time is one of attention deficit disorder combined type with the continued rule out of a mood disorder. I have not seen the patient in any significant fluctuation in mood state since I started seeing him. His mood is always been quite cheerful and pleasant with no indications of manic or hypomanic episodes or depressive episodes to this point.  Diagnosis:    Axis I:  Mood disorder  Attention deficit disorder with hyperactivity(314.01)  OCD (obsessive compulsive disorder)      RODENBOUGH,JOHN R, PsyD 05/14/2014

## 2014-05-14 NOTE — Telephone Encounter (Signed)
Opened in Error.

## 2014-06-02 ENCOUNTER — Ambulatory Visit (INDEPENDENT_AMBULATORY_CARE_PROVIDER_SITE_OTHER): Payer: 59 | Admitting: Psychiatry

## 2014-06-02 ENCOUNTER — Encounter (HOSPITAL_COMMUNITY): Payer: Self-pay | Admitting: Psychiatry

## 2014-06-02 VITALS — BP 122/80 | Ht 66.0 in | Wt 177.0 lb

## 2014-06-02 DIAGNOSIS — F902 Attention-deficit hyperactivity disorder, combined type: Secondary | ICD-10-CM

## 2014-06-02 DIAGNOSIS — F39 Unspecified mood [affective] disorder: Secondary | ICD-10-CM

## 2014-06-02 MED ORDER — LISDEXAMFETAMINE DIMESYLATE 50 MG PO CAPS: 50.0000 mg | ORAL_CAPSULE | Freq: Every day | ORAL | Status: DC

## 2014-06-02 NOTE — Progress Notes (Signed)
Patient ID: [name], male   DOB: May 04, 1991, 23 y.o.   MRN: [id number] Patient ID: [name], male   DOB: 02-06-1991, 23 y.o.   MRN: [id number] Patient ID: [name], male   DOB: 1990-09-20, 23 y.o.   MRN: [id number] Patient ID: [name], male   DOB: 1991/08/08, 23 y.o.   MRN: [id number] Patient ID: [name], male   DOB: 04-30-1991, 23 y.o.   MRN: [id number] Patient ID: [name], male   DOB: 05/15/1991, 23 y.o.   MRN: [id number] Patient ID: [name], male   DOB: 19-Dec-1990, 23 y.o.   MRN: [id number] Patient ID: [name], male   DOB: [birth year], 23 y.o.   MRN: [id number] [facility] [address] Progress Note LAREN WHALING MRN: [id number] DOB: 01-27-1991 Age: 23 y.o.  Date: 06/02/2014 Start Time: 2:55 PM End Time: 3:08 PM  Chief Complaint: Chief Complaint  Patient presents with  . ADHD  . 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   His mother reports that he was always a very hyperactive child wouldn't listen and acted out. He was constantly in trouble in school. He started on Ritalin at an early age but it made him like a zombie and he lost a lot of weight. Over the years she's been on numerous stimulants. At age 10 his father took custody of him. He did not do well with his father and was very oppositional. He was eventually placed in a group home and went through 3 group homes to his teen years.  At age 35 he was arrested for continued contributing to do the deliquenct of a minor. He was dating a 22 year old girl who ran away from home and was found with him he spent about 40 days in jail but he claims "turn my life around." Shortly after getting out of jail his father got ill with cancer and he spent a lot of time with him and was glad that they could  reunite. The patient states he's currently doing well. He's under good control of his behavior. He's not particularly impulsive and is stable to stay focused. His mother states that he can't work full-time because of some onset something that upsets him he becomes very depressed. For now however he sleeping and eating well and seems to be happy with his current girlfriend. He's had no further legal charges.  The patient returns after one month. Last time we started Vyvanse 40 mg every morning. He feels much better he is focusing more and is starting to lose weight. Is causing some slight irritability. He doesn't think it is quite strong enough because he loses focus at times. I told him we can go up to the 50 mg dose. He found out that his wife is pregnant again and they have a 59-month-old baby girl right now. He doesn't have any source of income but he is not discouraged because he thinks he will  get disability or find a job   History of Present Illness: He has been on Ritalin, Concerta, Focalin.  He has also been on Depakote, Abilify as well as Strattera. Allergies: Allergies  Allergen Reactions  . Strattera [Atomoxetine Hcl] Other (See Comments)    Became violent on Strattera.  . Bactrim [Sulfamethoxazole-Tmp Ds]   .  Sulfonamide Derivatives Rash   Medical History: Past Medical History  Diagnosis Date  . ADHD (attention deficit hyperactivity disorder)   . Unspecified episodic mood disorder   . Acne   . Obsessive-compulsive disorder   . Oppositional defiant disorder    Surgical History: Past Surgical History  Procedure Laterality Date  . Tendon lengthening      leg tendon stretch as child  . Tendon lengthening      age 30  . Tooth extraction     Family History: family history includes Alcohol abuse in his paternal aunt; Bipolar disorder in his cousin; Drug abuse in his paternal aunt. There is no history of Anxiety disorder, Dementia, Paranoid behavior, Physical abuse,  Schizophrenia, Seizures, or Sexual abuse. Reviewed today in the office and nothing has changed.  Suicidal Ideation: No Plan Formed: No Patient has means to carry out plan: No  Homicidal Ideation: No Plan Formed: No Patient has means to carry out plan: No  Review of Systems: Psychiatric: Agitation: No Hallucination: No Depressed Mood: No Insomnia: No Hypersomnia: No Altered Concentration: No Feels Worthless: No Grandiose Ideas: No Belief In Special Powers: No New/Increased Substance Abuse: No Compulsions: No  Neurologic: Headache: No Seizure: No Paresthesias: No  Past Medical Family, Social History: cHe has finished his course work at the [employer]. He is currently living with a friend Patient says that he's dating an 23 year old.  Family History family history includes Alcohol abuse in his paternal aunt; Bipolar disorder in his cousin; Drug abuse in his paternal aunt. There is no history of Anxiety disorder, Dementia, Paranoid behavior, Physical abuse, Schizophrenia, Seizures, or Sexual abuse.  Medications: Outpatient Encounter Prescriptions as of 06/02/2014  Medication Sig  . albuterol (PROVENTIL HFA;VENTOLIN HFA) 108 (90 BASE) MCG/ACT inhaler Inhale 2 puffs into the lungs every 6 (six) hours as needed for wheezing.  Marland Kitchen lisdexamfetamine (VYVANSE) 50 MG capsule Take 1 capsule (50 mg total) by mouth daily.  Marland Kitchen lisdexamfetamine (VYVANSE) 50 MG capsule Take 1 capsule (50 mg total) by mouth daily.  Marland Kitchen lisdexamfetamine (VYVANSE) 50 MG capsule Take 1 capsule (50 mg total) by mouth daily.  . naproxen (NAPROSYN) 500 MG tablet Take 500 mg by mouth 2 (two) times daily as needed.  . [DISCONTINUED] lisdexamfetamine (VYVANSE) 40 MG capsule Take 1 capsule (40 mg total) by mouth every morning.    Past Psychiatric History/Hospitalization(s): Anxiety: No Bipolar Disorder: Yes Depression: Yes Mania: No Psychosis: No Schizophrenia: No Personality Disorder: No Hospitalization for  psychiatric illness: Yes History of Electroconvulsive Shock Therapy: No Prior Suicide Attempts: No  Physical Exam: Constitutional:  BP 122/80  Ht 5\' 6"  (1.676 m)  Wt 177 lb (80.287 kg)  BMI 28.58 kg/m2  General Appearance alert oriented f  Musculoskeletal: Strength & Muscle Tone: within normal limits Gait & Station: normal Patient leans: N/A  Psychiatric: Speech (describe rate, volume, coherence, spontaneity, and abnormalities if any): Normal in volume rate and tone spontaneous  Thought Process (describe rate, content, abstract reasoning, and computation): Organized, goal-directed, age-appropriate  Associations: Intact  Thoughts: normal  Mental Status: Orientation: oriented to person, place, time/date and situation Mood & Affect: Irritable Attention Span & Concentration:improved  Lab Results:  Results for orders placed in visit on 09/22/13 (from the past 8736 hour(s))  GLUCOSE, RANDOM   Collection Time    09/22/13 10:50 AM      Result Value Ref Range   Glucose, Bld 85  70 - 99 mg/dL  LIPID PANEL   Collection Time    09/22/13  10:50 AM      Result Value Ref Range   Cholesterol 240 (*) 0 - 200 mg/dL   Triglycerides 162 (*) <150 mg/dL   HDL 47  >39 mg/dL   Total CHOL/HDL Ratio 5.1     VLDL 32  0 - 40 mg/dL   LDL Cholesterol 161 (*) 0 - 99 mg/dL  BASIC METABOLIC PANEL   Collection Time    09/22/13 10:50 AM      Result Value Ref Range   Sodium 142  135 - 145 mEq/L   Potassium 4.7  3.5 - 5.3 mEq/L   Chloride 103  96 - 112 mEq/L   CO2 29  19 - 32 mEq/L   Glucose, Bld 85  70 - 99 mg/dL   BUN 19  6 - 23 mg/dL   Creat 0.66  0.50 - 1.35 mg/dL   Calcium 9.3  8.4 - 10.5 mg/dL  TSH   Collection Time    09/22/13 10:50 AM      Result Value Ref Range   TSH 0.972  0.350 - 4.500 uIU/mL  T4   Collection Time    09/22/13 10:50 AM      Result Value Ref Range   T4, Total 10.4  5.0 - 12.5 ug/dL  D-DIMER, QUANTITATIVE   Collection Time    09/22/13 10:50 AM      Result  Value Ref Range   D-Dimer, Quant 0.27  0.00 - 0.48 ug/mL-FEU   PCP draws routine labs and nothing is emerging as of concern.  Assessment: Axis I: Mood disorder NOS, ADHD combined type    Axis II: Deferred  Axis III: Wears glasses, acne, Hx scabes  Axis IV: Moderate  Axis V: 65  Plan: I took his vitals.  I reviewed CC, tobacco/med/surg Hx, meds effects/ side effects, problem list, therapies and responses as well as current situation/symptoms discussed options. He will increase Vyvanse to 50 mg every morning  He'll return in 3 months See orders and pt instructions for more details.  MEDICATIONS this encounter: Meds ordered this encounter  Medications  . lisdexamfetamine (VYVANSE) 50 MG capsule    Sig: Take 1 capsule (50 mg total) by mouth daily.    Dispense:  30 capsule    Refill:  0  . lisdexamfetamine (VYVANSE) 50 MG capsule    Sig: Take 1 capsule (50 mg total) by mouth daily.    Dispense:  30 capsule    Refill:  0    Do not fill before11/14/15  . lisdexamfetamine (VYVANSE) 50 MG capsule    Sig: Take 1 capsule (50 mg total) by mouth daily.    Dispense:  30 capsule    Refill:  0    Do not fill before 08/02/14   Medical Decision Making Problem Points:  Established problem, stable/improving (1), Review of last therapy session (1), Review of psycho-social stressors (1) and Self-limited or minor (1) Data Points:  Review or order clinical lab tests (1) Review of medication regiment & side effects (2)  I certify that outpatient services furnished can reasonably be expected to improve the patient's condition.   Levonne Spiller, MD

## 2014-06-21 ENCOUNTER — Encounter: Payer: Self-pay | Admitting: Family Medicine

## 2014-06-21 ENCOUNTER — Ambulatory Visit (INDEPENDENT_AMBULATORY_CARE_PROVIDER_SITE_OTHER): Payer: Medicaid Other | Admitting: Family Medicine

## 2014-06-21 VITALS — BP 148/90 | Ht 66.0 in | Wt 173.0 lb

## 2014-06-21 DIAGNOSIS — M545 Low back pain: Secondary | ICD-10-CM

## 2014-06-21 MED ORDER — TIZANIDINE HCL 4 MG PO TABS: 4.0000 mg | ORAL_TABLET | Freq: Every evening | ORAL | Status: DC | PRN

## 2014-06-21 MED ORDER — NAPROXEN 500 MG PO TABS: 500.0000 mg | ORAL_TABLET | Freq: Two times a day (BID) | ORAL | Status: DC

## 2014-06-21 NOTE — Progress Notes (Signed)
   Subjective:    Patient ID: [name], male    DOB: 01/13/1991, 23 y.o.   MRN: [id number]  Back Pain This is a new problem. The current episode started in the past 7 days. The symptoms are aggravated by bending and twisting. Stiffness is present all day. Pertinent negatives include no abdominal pain or headaches. Treatments tried: icy hot. The treatment provided mild relief.    Present for the past few days Knows of no triggers Does not radiate Increased pain with lifting 6 month baby  Review of Systems  Constitutional: Negative for activity change, appetite change and fatigue.  Gastrointestinal: Negative for abdominal pain.  Musculoskeletal: Positive for back pain and arthralgias.  Neurological: Negative for headaches.  Psychiatric/Behavioral: Negative for behavioral problems.       Objective:   Physical Exam  Constitutional: He appears well-nourished.  Cardiovascular: Normal rate, regular rhythm and normal heart sounds.   No murmur heard. Pulmonary/Chest: Effort normal and breath sounds normal.  Musculoskeletal: He exhibits no edema.  Lumbar pain  Lymphadenopathy:    He has no cervical adenopathy.  Neurological: He is alert.  Psychiatric: His behavior is normal.  Vitals reviewed.  Neg straight leg raise       Assessment & Plan:  Lumbar pain-probably ligamentous. I would recommend stretching exercises anti-inflammatory and muscle relaxer for nighttime use I do not feel that the patient has a herniated disc certainly if over the next 6 weeks if he has progressive trouble we may have to do a scan. Currently right now has no red flags toward ruptured disc

## 2014-06-23 ENCOUNTER — Ambulatory Visit: Payer: 59

## 2014-06-23 ENCOUNTER — Ambulatory Visit (INDEPENDENT_AMBULATORY_CARE_PROVIDER_SITE_OTHER): Payer: 59

## 2014-06-23 DIAGNOSIS — Z23 Encounter for immunization: Secondary | ICD-10-CM

## 2014-06-25 ENCOUNTER — Encounter (HOSPITAL_COMMUNITY): Payer: Self-pay | Admitting: Physical Therapy

## 2014-06-25 ENCOUNTER — Ambulatory Visit (HOSPITAL_COMMUNITY)
Admission: RE | Admit: 2014-06-25 | Discharge: 2014-06-25 | Disposition: A | Discharge status: Home / Self Care | Payer: Medicaid Other | Source: Ambulatory Visit | Attending: Family Medicine | Admitting: Family Medicine

## 2014-06-25 DIAGNOSIS — M25519 Pain in unspecified shoulder: Secondary | ICD-10-CM | POA: Insufficient documentation

## 2014-06-25 DIAGNOSIS — M545 Low back pain, unspecified: Secondary | ICD-10-CM

## 2014-06-25 DIAGNOSIS — Z5189 Encounter for other specified aftercare: Secondary | ICD-10-CM | POA: Insufficient documentation

## 2014-06-25 NOTE — Patient Instructions (Signed)
  Backward Bend (Standing)   Arch backward to make hollow of back deeper. Hold ____ seconds. Repeat ____ times per set. Do ____ sets per session. Do ____ sessions per day.  http://orth.exer.us/178   Copyright  VHI. All rights reserved.  Knee-to-Chest Stretch: Unilateral   With hand behind right knee, pull knee in to chest until a comfortable stretch is felt in lower back and buttocks. Keep back relaxed. Hold ____ seconds. Repeat ____ times per set. Do ____ sets per session. Do ____ sessions per day.  http://orth.exer.us/126   Copyright  VHI. All rights reserved.  Lower Trunk Rotation Stretch   Keeping back flat and feet together, rotate knees to left side. Hold ____ seconds. Repeat ____ times per set. Do ____ sets per session. Do ____ sessions per day.  http://orth.exer.us/122   Copyright  VHI. All rights reserved.  Hamstring Stretch: Active   Support behind right knee. Starting with knee bent, attempt to straighten knee until a comfortable stretch is felt in back of thigh. Hold ____ seconds. Repeat ____ times per set. Do ____ sets per session. Do ____ sessions per day.  http://orth.exer.us/158   Copyright  VHI. All rights reserved.  Isometric Abdominal   Lying on back with knees bent, tighten stomach by pressing elbows down. Hold ____ seconds. Repeat ____ times per set. Do ____ sets per session. Do ____ sessions per day.  http://orth.exer.us/1086   Copyright  VHI. All rights reserved.  Bent Leg Lift (Hook-Lying)   Tighten stomach and slowly raise right leg ____ inches from floor. Keep trunk rigid. Hold ____ seconds. Repeat ____ times per set. Do ____ sets per session. Do ____ sessions per day.  http://orth.exer.us/1090   Copyright  VHI. All rights reserved.  Bridging   Slowly raise buttocks from floor, keeping stomach tight. Repeat ____ times per set. Do ____ sets per session. Do ____ sessions per day.  http://orth.exer.us/1096   Copyright  VHI.  All rights reserved.  Heel Squeeze (Prone)   Abdomen supported, bend knees and gently squeeze heels together. Hold ____ seconds. Repeat ____ times per set. Do ____ sets per session. Do ____ sessions per day.  http://orth.exer.us/1080   Copyright  VHI. All rights reserved.  Straight Leg Raise (Prone)   Abdomen and head supported, keep left knee locked and raise leg at hip. Avoid arching low back. Repeat ____ times per set. Do ____ sets per session. Do ____ sessions per day.  http://orth.exer.us/1112   Copyright  VHI. All rights reserved.  On Elbows (Prone)   Rise up on elbows as high as possible, keeping hips on floor. Hold ____ seconds. Repeat ____ times per set. Do ____ sets per session. Do ____ sessions per day.   http://orth.exer.us/92   Copyright  VHI. All rights reserved.   Opposite Arm / Leg Lift (Prone)   Abdomen and head supported, left knee locked, raise leg and opposite arm ____ inches from floor. Repeat ____ times per set. Do ____ sets per session. Do ____ sessions per day.  http://orth.exer.us/1114   Copyright  VHI. All rights reserved.

## 2014-06-25 NOTE — Therapy (Addendum)
Physical Therapy Evaluation  Patient Details  Name: [name] MRN: [id number] Date of Birth: Jun 15, 1991  Encounter Date: 06/25/2014      PT End of Session - 06/25/14 1623    Visit Number 1   Authorization Type medicaid   PT Start Time 0272   PT Stop Time 1515   PT Time Calculation (min) 30 min      Past Medical History  Diagnosis Date  . ADHD (attention deficit hyperactivity disorder)   . Unspecified episodic mood disorder   . Acne   . Obsessive-compulsive disorder   . Oppositional defiant disorder     Past Surgical History  Procedure Laterality Date  . Tendon lengthening      leg tendon stretch as child  . Tendon lengthening      age 37  . Tooth extraction      There were no vitals taken for this visit.  Visit Diagnosis:  Bilateral low back pain without sciatica      Subjective Assessment - 06/25/14 1448    Symptoms [name] states that his back has been hurting on and off for two years but in the past week his pain has increased.  He hurts from the shoulders to the low back .    How long can you sit comfortably? Pt states he is unable to sit in comfort for any period of time.   How long can you stand comfortably? states he is unable to stand with comfort   How long can you walk comfortably? unable to walk any length with comfort    Currently in Pain? Yes   Pain Score 8    Pain Location Back   Pain Orientation Lower;Right;Left          [facility] PT Assessment - 06/25/14 1453    Assessment   Medical Diagnosis lumbar pain   Prior Therapy none   AROM   Lumbar Flexion wnl   Lumbar Extension wnl    Lumbar - Right Side Bend wnl   Lumbar - Left Side Bend wnl   Lumbar - Right Rotation wnl   Lumbar - Left Rotation wnl   Strength   Overall Strength Comments LE B wnl          [facility] Adult PT Treatment/Exercise - 06/25/14 1455    Exercises   Exercises Lumbar   Lumbar Exercises: Stretches   Active Hamstring Stretch 2 reps;30 seconds   Single Knee to  Chest Stretch 3 reps;60 seconds   Standing Extension 5 reps   Lumbar Exercises: Standing   Scapular Retraction 10 reps;Theraband   Theraband Level (Scapular Retraction) Level 3 (Green)   Row 10 reps;Theraband   Theraband Level (Row) Level 3 (Green)   Shoulder Extension 10 reps;Theraband   Lumbar Exercises: Supine   Ab Set 5 reps   Bridge 10 reps   Straight Leg Raise 10 reps   Lumbar Exercises: Prone   Straight Leg Raise 10 reps   Opposite Arm/Leg Raise Left arm/Right leg;10 reps;Right arm/Left leg            PT Short Term Goals - 06/25/14 1626    PT SHORT TERM GOAL #1   Title hep   Time 1   Period Days            Plan - 06/25/14 1624    Clinical Impression Statement Pt is a 23 yo with acute low back pain.  His insurance will cover an evaluation only.  Pt was educated in proper  posture, body mechanics for bed mobility as well as given postural and stabilzation exercises to complete at home.    Rehab Potential Good   PT Plan discharge one time visit.         Problem List Patient Active Problem List   Diagnosis Date Noted  . OCD (obsessive compulsive disorder) 08/06/2012  . Unspecified episodic mood disorder 07/18/2011  . ADHD (attention deficit hyperactivity disorder), combined type 07/18/2011  . 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 CLOSED FRACTURE METACARPAL BONE SITE UNSPECIFIED 11/24/2009          Alyzae Hawkey,CINDY  PT  06/25/2014, 4:33 PM

## 2014-07-07 NOTE — Addendum Note (Signed)
Encounter addended by: Leeroy Cha, PT on: 07/07/2014  2:21 PM<BR>     Documentation filed: Clinical Notes

## 2014-07-19 ENCOUNTER — Ambulatory Visit (INDEPENDENT_AMBULATORY_CARE_PROVIDER_SITE_OTHER): Payer: 59 | Admitting: Psychology

## 2014-07-19 DIAGNOSIS — F902 Attention-deficit hyperactivity disorder, combined type: Secondary | ICD-10-CM

## 2014-07-19 DIAGNOSIS — F39 Unspecified mood [affective] disorder: Secondary | ICD-10-CM

## 2014-07-29 ENCOUNTER — Encounter (HOSPITAL_COMMUNITY): Payer: Self-pay | Admitting: Psychology

## 2014-07-29 NOTE — Progress Notes (Signed)
PROGRESS NOTE  Patient:  [name]   DOB: April 23, 1991  MR Number: [id number]  Location: [facility] [address] [place] [address] Dept: [phone]  Start: 1 PM End: 2 PM  Provider/Observer:     Edgardo Roys PSYD  Chief Complaint:      Chief Complaint  Patient presents with  . Agitation  . Anxiety  . ADHD  . Stress    Reason For Service:     The patient was referred by his treating psychiatrist Dr. Dwyane Stokes, who felt that he needed to engage in some psychotherapeutic interventions and building some of his coping skills. The patient has been seen by Dr. Dwyane Stokes through the [facility] mental health Department and then started seeing her here through [facility]. He is carried a number of diagnoses including conduct disorder with adolescent onset, attention deficit disorder with hyperactivity, and mood disorder NOS. He has been tried on a number of different medications including Concerta and Abilify as well as Depakote and Adderall. He does appear to be responding well to his current medications but there was a concern or interest in helping build more coping skills to do is reduced intellectual capacity and poor coping skills.  Interventions Strategy:  Cognitive/behavioral psychotherapy  Participation Level:   Active  Participation Quality:  Appropriate      Behavioral Observation:  Well Groomed, Alert, and Appropriate.   Current Psychosocial Factors: The patient comes in today along with his mother. While he tried to act like he was very happy in a positive mood is clear that his mother was quite distressed and the patient appears to be distressed as well. His wife is now pregnant with the second child. While reports that they had planned on doing this in the been trying he did not mention this and prior sessions or appointments. [name] is very immature and  they both have such a strong desire to have pets or other things around. He is continuing to struggle with his inability to work her maintain a job. He has finally found another place to live but it is unclear what how long this will be stable for him.    Content of Session:   Reviewed current symptoms and continue to work on building coping skills and strategies.  Current Status:   Patient is coping with a new born and a new wife.  He impulsively lost his job and had to get assistance such as restated food stamps and [employer].  Still getting help from others.  Has maintained fairly stable mood with episodic periods of significant depression.  Patient Progress:   Stable  Target Goals:   Reduce impulsive and maladaptive behaviors and engage in better use of coping skills.  Last Reviewed:   07/19/2014  Goals Addressed Today:    We continue to work on target goals of reducing the impulsive and manipulative behaviors and better at implementation of coping skills for judgment and future planning.  Impression/Diagnosis:   The patient has been diagnosed with a number of psychiatric conditions including mood disorder, attention deficit disorder, conduct disorder and other variations of these conditions. I do think that the most appropriate diagnosis at this time is one of attention deficit disorder combined type with the continued rule out of a mood disorder. I have not seen the patient in any significant fluctuation in mood state since I started seeing him. His mood is always been  quite cheerful and pleasant with no indications of manic or hypomanic episodes or depressive episodes to this point.  Diagnosis:    Axis I:  ADHD (attention deficit hyperactivity disorder), combined type  Mood disorder      [name] R, PsyD 07/29/2014

## 2014-08-11 ENCOUNTER — Ambulatory Visit (INDEPENDENT_AMBULATORY_CARE_PROVIDER_SITE_OTHER): Payer: 59 | Admitting: Family Medicine

## 2014-08-11 DIAGNOSIS — R358 Other polyuria: Secondary | ICD-10-CM

## 2014-08-11 DIAGNOSIS — M545 Low back pain, unspecified: Secondary | ICD-10-CM

## 2014-08-11 DIAGNOSIS — R3589 Other polyuria: Secondary | ICD-10-CM

## 2014-08-11 MED ORDER — IBUPROFEN 600 MG PO TABS: 600.0000 mg | ORAL_TABLET | Freq: Three times a day (TID) | ORAL | Status: DC | PRN

## 2014-08-11 NOTE — Progress Notes (Signed)
   Subjective:    Patient ID: [name], male    DOB: Jul 01, 1991, 23 y.o.   MRN: [id number]  HPI Would like to discuss vasectomy.     Review of Systems     Objective:   Physical Exam   Patient states when he drinks a lot of liquids he does urinate a lot I told him this is normal     Assessment & Plan:  15 minutes was spent with this patient discussing family birth control measures as well as vasectomy. There is no easy answers. The following will need to be found out. #1 does his insurance covered vasectomy? If it is not covered what is the approximate cost out of pocket? #2 does urology do vasectomies on 13 year olds? #3 the patient states he heard of donating a testicle for money. I have never ever ever heard of that before. Urology office can be asked if they have ever heard of this. If referral is necessary for urology please utilize the referral. I would recommend that if the vasectomy is not covered that the patient be informed of the cost for scheduling an appointment  Finally ibuprofen prescribed for intermittent back pain, he has used this before.  I told the patient if his wife had an IUD used more than likely that would help prevent unwanted pregnancies Please inform the patient of the findings of these answers.

## 2014-08-16 ENCOUNTER — Emergency Department (HOSPITAL_COMMUNITY): Payer: Medicaid Other

## 2014-08-16 ENCOUNTER — Emergency Department (HOSPITAL_COMMUNITY)
Admission: EM | Admit: 2014-08-16 | Discharge: 2014-08-16 | Disposition: A | Discharge status: Home / Self Care | Payer: Medicaid Other | Attending: Emergency Medicine | Admitting: Emergency Medicine

## 2014-08-16 ENCOUNTER — Encounter (HOSPITAL_COMMUNITY): Payer: Self-pay | Admitting: Emergency Medicine

## 2014-08-16 DIAGNOSIS — Z872 Personal history of diseases of the skin and subcutaneous tissue: Secondary | ICD-10-CM | POA: Insufficient documentation

## 2014-08-16 DIAGNOSIS — Y288XXA Contact with other sharp object, undetermined intent, initial encounter: Secondary | ICD-10-CM | POA: Insufficient documentation

## 2014-08-16 DIAGNOSIS — Y9289 Other specified places as the place of occurrence of the external cause: Secondary | ICD-10-CM | POA: Insufficient documentation

## 2014-08-16 DIAGNOSIS — Z87891 Personal history of nicotine dependence: Secondary | ICD-10-CM | POA: Diagnosis not present

## 2014-08-16 DIAGNOSIS — Y9389 Activity, other specified: Secondary | ICD-10-CM | POA: Diagnosis not present

## 2014-08-16 DIAGNOSIS — F909 Attention-deficit hyperactivity disorder, unspecified type: Secondary | ICD-10-CM | POA: Diagnosis not present

## 2014-08-16 DIAGNOSIS — Z23 Encounter for immunization: Secondary | ICD-10-CM | POA: Insufficient documentation

## 2014-08-16 DIAGNOSIS — T1490XA Injury, unspecified, initial encounter: Secondary | ICD-10-CM

## 2014-08-16 DIAGNOSIS — S61411A Laceration without foreign body of right hand, initial encounter: Secondary | ICD-10-CM | POA: Insufficient documentation

## 2014-08-16 DIAGNOSIS — Y998 Other external cause status: Secondary | ICD-10-CM | POA: Insufficient documentation

## 2014-08-16 DIAGNOSIS — Z79899 Other long term (current) drug therapy: Secondary | ICD-10-CM | POA: Insufficient documentation

## 2014-08-16 DIAGNOSIS — F913 Oppositional defiant disorder: Secondary | ICD-10-CM | POA: Insufficient documentation

## 2014-08-16 MED ORDER — LIDOCAINE-EPINEPHRINE-TETRACAINE (LET) SOLUTION
3.0000 mL | Freq: Once | NASAL | Status: DC
  Filled 2014-08-16: qty 3

## 2014-08-16 MED ORDER — LIDOCAINE-EPINEPHRINE (PF) 2 %-1:200000 IJ SOLN
10.0000 mL | Freq: Once | INTRAMUSCULAR | Status: AC
  Administered 2014-08-16: 10 mL
  Filled 2014-08-16: qty 20

## 2014-08-16 MED ORDER — TETANUS-DIPHTH-ACELL PERTUSSIS 5-2.5-18.5 LF-MCG/0.5 IM SUSP
0.5000 mL | Freq: Once | INTRAMUSCULAR | Status: AC
  Administered 2014-08-16: 0.5 mL via INTRAMUSCULAR
  Filled 2014-08-16: qty 0.5

## 2014-08-16 MED ORDER — LIDOCAINE-EPINEPHRINE-TETRACAINE (LET) SOLUTION
3.0000 mL | Freq: Once | NASAL | Status: AC
  Administered 2014-08-16: 3 mL via TOPICAL

## 2014-08-16 NOTE — ED Notes (Signed)
Patient states "I slammed my hand down on the table and wasn't looking and slammed it on a dog food can top." Patient reports numbness to right index finger and states "I broke my hand before and the pain feels the same way."

## 2014-08-16 NOTE — ED Notes (Signed)
Pt reports was trying to open a can of dog food and cut right hand. Large laceration noted to right palm. Mild bleeding noted with movement of right hand. Distal pulses intact. Cap refill <3 secs. Pt reports numbness to right index finger.

## 2014-08-16 NOTE — ED Provider Notes (Signed)
CSN: [id number]     Arrival date & time 08/16/14  1229 History  This chart was scribed for non-physician practitioner, Abigail Butts, PA-C working with Fletcher, DO by Einar Pheasant, ED scribe. This patient was seen in room APFT23/APFT23 and the patient's care was started at 3:22 PM.     Chief Complaint  Patient presents with  . Laceration   The history is provided by the patient and medical records. No language interpreter was used.   HPI Comments: [name] is a 23 y.o. male with a PMhx of ADHD, OCD, and oppositional defiant disorder, presents to the Emergency Department complaining of a laceration to right palm that occurred approximately 3 hours ago. Pt states that he slammed his right hand on an open can of dog food. [name] does not recall the date of his last tetanus vaccine. He is complaining of associated pain to the area. Mr.[name] states that his current pain is similar in nature to a past fx to his left hand. Pt reports applying Vaseline to the wound prior to arrival. Denies any fever,  Chills, nausea, emesis, HA, weakness, or numbness.   Past Medical History  Diagnosis Date  . ADHD (attention deficit hyperactivity disorder)   . Unspecified episodic mood disorder   . Acne   . Obsessive-compulsive disorder   . Oppositional defiant disorder    Past Surgical History  Procedure Laterality Date  . Tendon lengthening      leg tendon stretch as child  . Tendon lengthening      age 78  . Tooth extraction     Family History  Problem Relation Age of Onset  . Bipolar disorder Cousin   . Alcohol abuse Paternal Aunt   . Drug abuse Paternal Aunt   . Anxiety disorder Neg Hx   . Dementia Neg Hx   . Paranoid behavior Neg Hx   . Physical abuse Neg Hx   . Schizophrenia Neg Hx   . Seizures Neg Hx   . Sexual abuse Neg Hx    History  Substance Use Topics  . Smoking status: Former Smoker -- 0.04 packs/day for 3 years    Types: Cigarettes    Quit date:  03/20/2013  . Smokeless tobacco: Never Used     Comment: "I quit again"  . Alcohol Use: No    Review of Systems  Constitutional: Negative for fever and chills.  Gastrointestinal: Negative for nausea and vomiting.  Skin: Positive for wound.  Allergic/Immunologic: Negative for immunocompromised state.  Neurological: Negative for weakness and numbness.  Hematological: Does not bruise/bleed easily.  Psychiatric/Behavioral: The patient is not nervous/anxious.       Allergies  Strattera; Bactrim; and Sulfonamide derivatives  Home Medications   Prior to Admission medications   Medication Sig Start Date End Date Taking? Authorizing Provider  lisdexamfetamine (VYVANSE) 50 MG capsule Take 1 capsule (50 mg total) by mouth daily. 06/02/14  Yes Levonne Spiller, MD  albuterol (PROVENTIL HFA;VENTOLIN HFA) 108 (90 BASE) MCG/ACT inhaler Inhale 2 puffs into the lungs every 6 (six) hours as needed for wheezing. Patient not taking: Reported on 08/16/2014 04/24/13   Kathyrn Drown, MD  ibuprofen (ADVIL,MOTRIN) 600 MG tablet Take 1 tablet (600 mg total) by mouth every 8 (eight) hours as needed. Patient not taking: Reported on 08/16/2014 08/11/14   Kathyrn Drown, MD  tiZANidine (ZANAFLEX) 4 MG tablet Take 1 tablet (4 mg total) by mouth at bedtime as needed for muscle spasms. Patient not taking:  Reported on 08/16/2014 06/21/14   Kathyrn Drown, MD   Triage Vitals: BP 152/105 mmHg  Pulse 113  Temp(Src) 99.2 F (37.3 C) (Oral)  Resp 16  Ht 5\' 6"  (1.676 m)  Wt 171 lb (77.565 kg)  BMI 27.61 kg/m2  SpO2 100%  Physical Exam  Constitutional: He is oriented to person, place, and time. He appears well-developed and well-nourished. No distress.  HENT:  Head: Normocephalic and atraumatic.  Eyes: Conjunctivae are normal. No scleral icterus.  Neck: Normal range of motion.  Cardiovascular: Normal rate, regular rhythm, normal heart sounds and intact distal pulses.   No murmur heard. Capillary refill < 3  sec No tachycardia  Pulmonary/Chest: Effort normal and breath sounds normal. No respiratory distress.  Musculoskeletal: Normal range of motion. He exhibits no edema.  ROM: Full range of motion of all fingers of the right hand  Neurological: He is alert and oriented to person, place, and time.  Sensation: Intact to dull and sharp in all fingers of the right hand; 2 point discrimination intact to all fingers of the right hand Strength: 5/5 in all fingers of the right hand, including strong grip strength  Skin: Skin is warm and dry. He is not diaphoretic. No erythema.  6 cm laceration across the palm of his right hand.   Psychiatric: He has a normal mood and affect.  Nursing note and vitals reviewed.   ED Course  LACERATION REPAIR Date/Time: 08/16/2014 4:56 PM Performed by: Abigail Butts Authorized by: Abigail Butts Consent: Verbal consent obtained. Risks and benefits: risks, benefits and alternatives were discussed Consent given by: patient Patient understanding: patient states understanding of the procedure being performed Patient consent: the patient's understanding of the procedure matches consent given Procedure consent: procedure consent matches procedure scheduled Relevant documents: relevant documents present and verified Test results: test results available and properly labeled Imaging studies: imaging studies available Required items: required blood products, implants, devices, and special equipment available Patient identity confirmed: verbally with patient and arm band Time out: Immediately prior to procedure a "time out" was called to verify the correct patient, procedure, equipment, support staff and site/side marked as required. Body area: upper extremity Location details: right hand Laceration length: 6 cm Foreign bodies: no foreign bodies Tendon involvement: none Nerve involvement: none Vascular damage: no Anesthesia: local infiltration Local  anesthetic: lidocaine 2% with epinephrine Anesthetic total: 6 ml Patient sedated: no Preparation: Patient was prepped and draped in the usual sterile fashion. Irrigation solution: saline Irrigation method: syringe Amount of cleaning: extensive Debridement: none Degree of undermining: none Skin closure: 5-0 Prolene Number of sutures: 12 Technique: simple Approximation: close Approximation difficulty: complex Dressing: 4x4 sterile gauze Patient tolerance: Patient tolerated the procedure well with no immediate complications   (including critical care time)  DIAGNOSTIC STUDIES: Oxygen Saturation is 100% on RA, normal by my interpretation.    COORDINATION OF CARE: 3:30 PM- Will perform a laceration repair procedure. Pt advised of plan for treatment and pt agrees.    Medications  lidocaine-EPINEPHrine-tetracaine (LET) solution (not administered)  lidocaine-EPINEPHrine-tetracaine (LET) solution (3 mLs Topical Given 08/16/14 1544)  lidocaine-EPINEPHrine (XYLOCAINE W/EPI) 2 %-1:200000 (PF) injection 10 mL (10 mLs Infiltration Given 08/16/14 1544)  Tdap (BOOSTRIX) injection 0.5 mL (0.5 mLs Intramuscular Given 08/16/14 1544)    Imaging Review Dg Hand Complete Right  08/16/2014   CLINICAL DATA:  RIGHT hand laceration.  Initial encounter.  EXAM: RIGHT HAND - COMPLETE 3+ VIEW  COMPARISON:  None.  FINDINGS: No osseous injury. Anatomic alignment.  No radiopaque foreign body. No gas dissecting within the soft tissues.  IMPRESSION: Negative.   Electronically Signed   By: Dereck Ligas M.D.   On: 08/16/2014 15:23     MDM   Final diagnoses:  Injury  Hand laceration, right, initial encounter    [name] presents with laceration.  Tdap booster given.Pressure irrigation performed. Laceration occurred < 8 hours prior to repair which was well tolerated. Pt has no co morbidities to effect normal wound healing. Discussed suture home care w pt and answered questions. Pt to f-u for wound  check and suture removal in 7 days. Pt is hemodynamically stable w no complaints prior to dc.     I have personally reviewed patient's vitals, nursing note and any pertinent labs or imaging.  I performed an focused physical exam; undressed when appropriate .    It has been determined that no acute conditions requiring further emergency intervention are present at this time. The patient/guardian have been advised of the diagnosis and plan. I reviewed any labs and imaging including any potential incidental findings. We have discussed signs and symptoms that warrant return to the ED and they are listed in the discharge instructions.    Vital signs are stable at discharge. Patient tachycardic at triage as well as mild hypertension however patient was tearful, anxious and in pain at that time. No tachycardia, physical exam. No signs or symptoms of hypertensive urgency.  Vitals were significantly improved at discharge.  BP 125/72 mmHg  Pulse 87  Temp(Src) 98.4 F (36.9 C) (Oral)  Resp 16  Ht 5\' 6"  (1.676 m)  Wt 171 lb (77.565 kg)  BMI 27.61 kg/m2  SpO2 99%  I personally performed the services described in this documentation, which was scribed in my presence. The recorded information has been reviewed and is accurate.   Abigail Butts, PA-C 08/16/14 [place], DO 08/17/14 [id number]

## 2014-08-16 NOTE — ED Notes (Signed)
Dressing applied in triage. Bleeding controlled. nad noted.

## 2014-08-16 NOTE — Discharge Instructions (Signed)

## 2014-08-18 ENCOUNTER — Ambulatory Visit (HOSPITAL_COMMUNITY): Payer: Self-pay | Admitting: Psychology

## 2014-08-23 ENCOUNTER — Encounter: Payer: Self-pay | Admitting: Family Medicine

## 2014-08-23 ENCOUNTER — Ambulatory Visit (INDEPENDENT_AMBULATORY_CARE_PROVIDER_SITE_OTHER): Payer: 59 | Admitting: Family Medicine

## 2014-08-23 VITALS — BP 120/70 | Ht 66.0 in | Wt 167.5 lb

## 2014-08-23 DIAGNOSIS — S61411S Laceration without foreign body of right hand, sequela: Secondary | ICD-10-CM

## 2014-08-23 NOTE — Progress Notes (Signed)
   Subjective:    Patient ID: [name], male    DOB: [birth year], 24 y.o.   MRN: [id number]  Laceration  The incident occurred 5 to 7 days ago. The laceration is located on the right hand. The laceration is 6 cm in size. The laceration mechanism was a metal edge. The pain is mild. The pain has been intermittent since onset. He reports no foreign bodies present. His tetanus status is UTD.  Patient states that he can't feel his right index finger. Patient was seen and treated last Monday at [facility] for this issue and patient received stitches.  Happened last Monday Patient has no other concerns at this time.    Review of Systems He does relate some numbness along the outer aspect of his index finger he has full range of motion though. Denies any signs of infection    Objective:   Physical Exam He has approximately a 3-laceration with multiple sutures no sign of infection noted has full range of motion of his hand and strength. Has subjective decreased sensation on the outer aspect of index finger still able to tell sensation but not as distinctly as the medial aspect  1 suture removed today     Assessment & Plan:  He will follow-up in one week's time to have the rest of the sutures removed  We will discuss with hand specialist if this mild nerve damage is something that they should see.  Interested in in vasectomy. He is not sure if his insurance covers it. If referral is necessary. Not sure if urology group has a limit on what age they will do this type of surgery on.

## 2014-08-24 ENCOUNTER — Encounter: Payer: Self-pay | Admitting: Family Medicine

## 2014-08-27 ENCOUNTER — Ambulatory Visit (INDEPENDENT_AMBULATORY_CARE_PROVIDER_SITE_OTHER): Payer: 59 | Admitting: Psychology

## 2014-08-27 ENCOUNTER — Encounter (HOSPITAL_COMMUNITY): Payer: Self-pay | Admitting: Psychology

## 2014-08-27 DIAGNOSIS — F39 Unspecified mood [affective] disorder: Secondary | ICD-10-CM

## 2014-08-27 DIAGNOSIS — F902 Attention-deficit hyperactivity disorder, combined type: Secondary | ICD-10-CM

## 2014-08-27 NOTE — Progress Notes (Signed)
      PROGRESS NOTE  Patient:  [name]   DOB: [birth year]  MR Number: [id number]  Location: [facility] [address] [place] [address] Dept: [phone]  Start: 11 AM End: 12 PM  Provider/Observer:     Edgardo Roys PSYD  Chief Complaint:      Chief Complaint  Patient presents with  . Anxiety  . Depression  . Agitation    Reason For Service:     The patient was referred by his treating psychiatrist Dr. Dwyane Dee, who felt that he needed to engage in some psychotherapeutic interventions and building some of his coping skills. The patient has been seen by Dr. Dwyane Dee through the [facility] mental health Department and then started seeing her here through [facility]. He is carried a number of diagnoses including conduct disorder with adolescent onset, attention deficit disorder with hyperactivity, and mood disorder NOS. He has been tried on a number of different medications including Concerta and Abilify as well as Depakote and Adderall. He does appear to be responding well to his current medications but there was a concern or interest in helping build more coping skills to do is reduced intellectual capacity and poor coping skills.  Interventions Strategy:  Cognitive/behavioral psychotherapy  Participation Level:   Active  Participation Quality:  Appropriate      Behavioral Observation:  Well Groomed, Alert, and Appropriate.   Current Psychosocial Factors: The patient comes in today along with his wife.  They had had an argument and patient cut his hand badly.  The patient reports that his mood has been down but was doing better over past few days.      Content of Session:   Reviewed current symptoms and continue to work on building coping skills and strategies.  Current Status:   Patient is coping with a new born and a new wife.  He impulsively lost his job and had to get  assistance such as restated food stamps and [place].  Still getting help from others.  Has maintained fairly stable mood with episodic periods of significant depression.  Patient Progress:   Stable  Target Goals:   Reduce impulsive and maladaptive behaviors and engage in better use of coping skills.  Last Reviewed:   08/27/2014  Goals Addressed Today:    We continue to work on target goals of reducing the impulsive and manipulative behaviors and better at implementation of coping skills for judgment and future planning.  Impression/Diagnosis:   The patient has been diagnosed with a number of psychiatric conditions including mood disorder, attention deficit disorder, conduct disorder and other variations of these conditions. I do think that the most appropriate diagnosis at this time is one of attention deficit disorder combined type with the continued rule out of a mood disorder. I have not seen the patient in any significant fluctuation in mood state since I started seeing him. His mood is always been quite cheerful and pleasant with no indications of manic or hypomanic episodes or depressive episodes to this point.  Diagnosis:    Axis I:  ADHD (attention deficit hyperactivity disorder), combined type  Mood disorder      Helder Crisafulli R, PsyD 08/27/2014

## 2014-08-30 ENCOUNTER — Ambulatory Visit (INDEPENDENT_AMBULATORY_CARE_PROVIDER_SITE_OTHER): Payer: Medicaid Other | Admitting: Family Medicine

## 2014-08-30 ENCOUNTER — Encounter: Payer: Self-pay | Admitting: Family Medicine

## 2014-08-30 VITALS — BP 118/82 | Ht 66.0 in | Wt 168.0 lb

## 2014-08-30 DIAGNOSIS — M792 Neuralgia and neuritis, unspecified: Secondary | ICD-10-CM

## 2014-08-30 DIAGNOSIS — S61411S Laceration without foreign body of right hand, sequela: Secondary | ICD-10-CM

## 2014-08-30 MED ORDER — ALBUTEROL SULFATE HFA 108 (90 BASE) MCG/ACT IN AERS: 2.0000 | INHALATION_SPRAY | Freq: Four times a day (QID) | RESPIRATORY_TRACT | Status: DC | PRN

## 2014-08-30 NOTE — Progress Notes (Signed)
   Subjective:    Patient ID: [name], male    DOB: July 31, 1991, 24 y.o.   MRN: [id number]  HPI Patient is here today to get his sutures removed from his right hand.   See discussion below. Has a little bit of numbness in the finger but to monofilament testing it is normal  Review of Systems Denies any dysfunction of the finger.    Objective:   Physical Exam  He has a laceration as well healed multiple sutures in place no sign of infection pinpoint testing patient does not have any significant numbness he is able to feel monofilament testing on his finger although he states it feels different on the lateral aspect. But he is still able to feel it. He states is better sensation than what it was a week ago      Assessment & Plan:  Has some underlying numbness of the finger but this is getting better he has full function he is able to fill monofilament testing so therefore I really doubt that a hand surgeon could do anything for him. Follow-up when necessary  Sutures removed without difficulty

## 2014-09-02 ENCOUNTER — Encounter (HOSPITAL_COMMUNITY): Payer: Self-pay | Admitting: Psychiatry

## 2014-09-02 ENCOUNTER — Ambulatory Visit (INDEPENDENT_AMBULATORY_CARE_PROVIDER_SITE_OTHER): Payer: MEDICAID | Admitting: Psychiatry

## 2014-09-02 VITALS — BP 133/84 | HR 81 | Ht 66.0 in | Wt 167.8 lb

## 2014-09-02 DIAGNOSIS — F902 Attention-deficit hyperactivity disorder, combined type: Secondary | ICD-10-CM

## 2014-09-02 DIAGNOSIS — F39 Unspecified mood [affective] disorder: Secondary | ICD-10-CM

## 2014-09-02 MED ORDER — LISDEXAMFETAMINE DIMESYLATE 50 MG PO CAPS: 50.0000 mg | ORAL_CAPSULE | Freq: Every day | ORAL | Status: DC

## 2014-09-02 MED ORDER — TRAZODONE HCL 50 MG PO TABS: 50.0000 mg | ORAL_TABLET | Freq: Every day | ORAL | Status: DC

## 2014-09-02 NOTE — Progress Notes (Signed)
Patient ID: [name], male   DOB: 10-Jun-1991, 24 y.o.   MRN: [id number] Patient ID: [name], male   DOB: Jan 12, 1991, 24 y.o.   MRN: [id number] Patient ID: [name], male   DOB: Feb 08, 1991, 24 y.o.   MRN: [id number] Patient ID: [name], male   DOB: 1991-05-23, 24 y.o.   MRN: [id number] Patient ID: [name], male   DOB: 1990-12-11, 24 y.o.   MRN: [id number] Patient ID: [name], male   DOB: 07-20-1991, 24 y.o.   MRN: [id number] Patient ID: [name], male   DOB: Nov 13, 1990, 23 y.o.   MRN: [id number] Patient ID: [name], male   DOB: [birth year], 24 y.o.   MRN: [id number] Patient ID: [name], male   DOB: December 04, 1990, 24 y.o.   MRN: [id number] [facility] [id number] Progress Note [name] MRN: [id number] DOB: Mar 13, 1991 Age: 24 y.o.  Date: 09/02/2014 Start Time: 2:55 PM End Time: 3:08 PM  Chief Complaint: Chief Complaint  Patient presents with  . ADHD  . Agitation  . 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   His mother reports that he was always a very hyperactive child wouldn't listen and acted out. He was constantly in trouble in school. He started on Ritalin at an Stokes age but it made him like a zombie and he lost a lot of weight. Over the years she's been on numerous stimulants. At age 38 his father took custody of him. He did not do well with his father and was very oppositional. He was eventually placed in a group home and went through 3 group homes to his teen years.  At age 21 he was arrested for continued contributing to do the deliquenct of a minor. He was dating a 23 year old girl who ran away from home and was found with him he spent about 40 days in jail but he claims "turn my life around." Shortly after getting out of jail his father got  ill with cancer and he spent a lot of time with him and was glad that they could reunite. The patient states he's currently doing well. He's under good control of his behavior. He's not particularly impulsive and is stable to stay focused. His mother states that he can't work full-time because of some onset something that upsets him he becomes very depressed. For now however he sleeping and eating well and seems to be happy with his current girlfriend. He's had no further legal charges.  The patient returns after 2 months. He states that right after Christmas he had argument with his wife and slammed his hand down on the table. Unfortunately he landed right on an open dog food can and sliced up his hand. He claims that hitting his hand on things is the only way to relieve stress. I strongly urged her to think of some other options. The Vyvanse seems to be helping his focus but he is staying up too late and I told him we could try trazodone to help with sleep. He also claims that he sometimes sees things all the corner of his eye and this really scares him but he's had it most of his life. I'm not sure that this is from any other psychiatric medications but could be  anxiety related   History of Present Illness: He has been on Ritalin, Concerta, Focalin.  He has also been on Depakote, Abilify as well as Strattera. Allergies: Allergies  Allergen Reactions  . Strattera [Atomoxetine Hcl] Other (See Comments)    Became violent on Strattera.  . Bactrim [Sulfamethoxazole-Trimethoprim]   . Sulfonamide Derivatives Rash   Medical History: Past Medical History  Diagnosis Date  . ADHD (attention deficit hyperactivity disorder)   . Unspecified episodic mood disorder   . Acne   . Obsessive-compulsive disorder   . Oppositional defiant disorder    Surgical History: Past Surgical History  Procedure Laterality Date  . Tendon lengthening      leg tendon stretch as child  . Tendon lengthening      age 29  .  Tooth extraction     Family History: family history includes Alcohol abuse in his paternal aunt; Bipolar disorder in his cousin; Drug abuse in his paternal aunt. There is no history of Anxiety disorder, Dementia, Paranoid behavior, Physical abuse, Schizophrenia, Seizures, or Sexual abuse. Reviewed today in the office and nothing has changed.  Suicidal Ideation: No Plan Formed: No Patient has means to carry out plan: No  Homicidal Ideation: No Plan Formed: No Patient has means to carry out plan: No  Review of Systems: Psychiatric: Agitation: No Hallucination: No Depressed Mood: No Insomnia: No Hypersomnia: No Altered Concentration: No Feels Worthless: No Grandiose Ideas: No Belief In Special Powers: No New/Increased Substance Abuse: No Compulsions: No  Neurologic: Headache: No Seizure: No Paresthesias: No  Past Medical Family, Social History: cHe has finished his course work at the [employer]. He is currently living with a friend Patient says that he's dating an 4 year old.  Family History family history includes Alcohol abuse in his paternal aunt; Bipolar disorder in his cousin; Drug abuse in his paternal aunt. There is no history of Anxiety disorder, Dementia, Paranoid behavior, Physical abuse, Schizophrenia, Seizures, or Sexual abuse.  Medications: Outpatient Encounter Prescriptions as of 09/02/2014  Medication Sig  . albuterol (PROVENTIL HFA;VENTOLIN HFA) 108 (90 BASE) MCG/ACT inhaler Inhale 2 puffs into the lungs every 6 (six) hours as needed for wheezing.  Marland Kitchen ibuprofen (ADVIL,MOTRIN) 600 MG tablet Take 1 tablet (600 mg total) by mouth every 8 (eight) hours as needed.  Marland Kitchen lisdexamfetamine (VYVANSE) 50 MG capsule Take 1 capsule (50 mg total) by mouth daily.  Marland Kitchen tiZANidine (ZANAFLEX) 4 MG tablet Take 1 tablet (4 mg total) by mouth at bedtime as needed for muscle spasms.  . [DISCONTINUED] lisdexamfetamine (VYVANSE) 50 MG capsule Take 1 capsule (50 mg total) by mouth daily.  .  traZODone (DESYREL) 50 MG tablet Take 1 tablet (50 mg total) by mouth at bedtime.    Past Psychiatric History/Hospitalization(s): Anxiety: No Bipolar Disorder: Yes Depression: Yes Mania: No Psychosis: No Schizophrenia: No Personality Disorder: No Hospitalization for psychiatric illness: Yes History of Electroconvulsive Shock Therapy: No Prior Suicide Attempts: No  Physical Exam: Constitutional:  BP 133/84 mmHg  Pulse 81  Ht 5\' 6"  (1.676 m)  Wt 167 lb 12.8 oz (76.114 kg)  BMI 27.10 kg/m2  General Appearance alert oriented   Musculoskeletal: Strength & Muscle Tone: within normal limits Gait & Station: normal Patient leans: N/A  Psychiatric: Speech (describe rate, volume, coherence, spontaneity, and abnormalities if any): Normal in volume rate and tone spontaneous  Thought Process (describe rate, content, abstract reasoning, and computation): Organized, goal-directed, age-appropriate  Associations: Intact  Thoughts: normal  Mental Status: Orientation: oriented to person, place, time/date and  situation Mood & Affect: Irritable, hyper talkative Attention Span & Concentration:improved  Lab Results:  Results for orders placed or performed in visit on 09/22/13 (from the past 8736 hour(s))  Glucose, random   Collection Time: 09/22/13 10:50 AM  Result Value Ref Range   Glucose, Bld 85 70 - 99 mg/dL  Lipid panel   Collection Time: 09/22/13 10:50 AM  Result Value Ref Range   Cholesterol 240 (H) 0 - 200 mg/dL   Triglycerides 162 (H) <150 mg/dL   HDL 47 >39 mg/dL   Total CHOL/HDL Ratio 5.1 Ratio   VLDL 32 0 - 40 mg/dL   LDL Cholesterol 161 (H) 0 - 99 mg/dL  Basic metabolic panel   Collection Time: 09/22/13 10:50 AM  Result Value Ref Range   Sodium 142 135 - 145 mEq/L   Potassium 4.7 3.5 - 5.3 mEq/L   Chloride 103 96 - 112 mEq/L   CO2 29 19 - 32 mEq/L   Glucose, Bld 85 70 - 99 mg/dL   BUN 19 6 - 23 mg/dL   Creat 0.66 0.50 - 1.35 mg/dL   Calcium 9.3 8.4 - 10.5  mg/dL  TSH   Collection Time: 09/22/13 10:50 AM  Result Value Ref Range   TSH 0.972 0.350 - 4.500 uIU/mL  T4   Collection Time: 09/22/13 10:50 AM  Result Value Ref Range   T4, Total 10.4 5.0 - 12.5 ug/dL  D-dimer, quantitative   Collection Time: 09/22/13 10:50 AM  Result Value Ref Range   D-Dimer, Quant 0.27 0.00 - 0.48 ug/mL-FEU   PCP draws routine labs and nothing is emerging as of concern.  Assessment: Axis I: Mood disorder NOS, ADHD combined type    Axis II: Deferred  Axis III: Wears glasses, acne, Hx scabes  Axis IV: Moderate  Axis V: 65  Plan: I took his vitals.  I reviewed CC, tobacco/med/surg Hx, meds effects/ side effects, problem list, therapies and responses as well as current situation/symptoms discussed options. He will continue Vyvanse to 50 mg every morning  and start trazodone 50 g daily at bedtime He'll return in 4 weeks See orders and pt instructions for more details.  MEDICATIONS this encounter: Meds ordered this encounter  Medications  . traZODone (DESYREL) 50 MG tablet    Sig: Take 1 tablet (50 mg total) by mouth at bedtime.    Dispense:  30 tablet    Refill:  2  . lisdexamfetamine (VYVANSE) 50 MG capsule    Sig: Take 1 capsule (50 mg total) by mouth daily.    Dispense:  30 capsule    Refill:  0   Medical Decision Making Problem Points:  Established problem, stable/improving (1), Review of last therapy session (1), Review of psycho-social stressors (1) and Self-limited or minor (1) Data Points:  Review or order clinical lab tests (1) Review of medication regiment & side effects (2)  I certify that outpatient services furnished can reasonably be expected to improve the patient's condition.   Levonne Spiller, MD

## 2014-09-25 ENCOUNTER — Emergency Department (HOSPITAL_COMMUNITY)
Admission: EM | Admit: 2014-09-25 | Discharge: 2014-09-26 | Disposition: A | Discharge status: Home / Self Care | Payer: Medicaid Other | Attending: Emergency Medicine | Admitting: Emergency Medicine

## 2014-09-25 ENCOUNTER — Encounter (HOSPITAL_COMMUNITY): Payer: Self-pay

## 2014-09-25 ENCOUNTER — Emergency Department (HOSPITAL_COMMUNITY): Payer: Medicaid Other

## 2014-09-25 DIAGNOSIS — R0789 Other chest pain: Secondary | ICD-10-CM | POA: Diagnosis not present

## 2014-09-25 DIAGNOSIS — F909 Attention-deficit hyperactivity disorder, unspecified type: Secondary | ICD-10-CM | POA: Insufficient documentation

## 2014-09-25 DIAGNOSIS — Z872 Personal history of diseases of the skin and subcutaneous tissue: Secondary | ICD-10-CM | POA: Insufficient documentation

## 2014-09-25 DIAGNOSIS — R0602 Shortness of breath: Secondary | ICD-10-CM | POA: Diagnosis present

## 2014-09-25 DIAGNOSIS — F1510 Other stimulant abuse, uncomplicated: Secondary | ICD-10-CM | POA: Insufficient documentation

## 2014-09-25 DIAGNOSIS — F419 Anxiety disorder, unspecified: Secondary | ICD-10-CM

## 2014-09-25 DIAGNOSIS — Z79899 Other long term (current) drug therapy: Secondary | ICD-10-CM | POA: Insufficient documentation

## 2014-09-25 LAB — CBC
HCT: 45.9 % (ref 39.0–52.0)
HEMOGLOBIN: 15.9 g/dL (ref 13.0–17.0)
MCH: 29.8 pg (ref 26.0–34.0)
MCHC: 34.6 g/dL (ref 30.0–36.0)
MCV: 86 fL (ref 78.0–100.0)
PLATELETS: 263 10*3/uL (ref 150–400)
RBC: 5.34 MIL/uL (ref 4.22–5.81)
RDW: 13.7 % (ref 11.5–15.5)
WBC: 10.2 10*3/uL (ref 4.0–10.5)

## 2014-09-25 LAB — BASIC METABOLIC PANEL
ANION GAP: 8 (ref 5–15)
BUN: 14 mg/dL (ref 6–23)
CALCIUM: 9.5 mg/dL (ref 8.4–10.5)
CHLORIDE: 106 mmol/L (ref 96–112)
CO2: 25 mmol/L (ref 19–32)
Creatinine, Ser: 0.86 mg/dL (ref 0.50–1.35)
GFR calc Af Amer: >90 mL/min (ref >90)
GFR calc non Af Amer: >90 mL/min (ref >90)
Glucose, Bld: 111 mg/dL — ABNORMAL HIGH (ref 70–99)
Potassium: 3.4 mmol/L — ABNORMAL LOW (ref 3.5–5.1)
Sodium: 139 mmol/L (ref 135–145)

## 2014-09-25 LAB — RAPID URINE DRUG SCREEN, HOSP PERFORMED
AMPHETAMINES: POSITIVE — AB
BENZODIAZEPINES: NOT DETECTED
Barbiturates: NOT DETECTED
Cocaine: NOT DETECTED
Opiates: NOT DETECTED
Tetrahydrocannabinol: NOT DETECTED

## 2014-09-25 LAB — TROPONIN I: Troponin I: <0.03 ng/mL (ref <0.031)

## 2014-09-25 LAB — D-DIMER, QUANTITATIVE (NOT AT ARMC)

## 2014-09-25 MED ORDER — KETOROLAC TROMETHAMINE 30 MG/ML IJ SOLN
30.0000 mg | Freq: Once | INTRAMUSCULAR | Status: AC
  Administered 2014-09-26: 30 mg via INTRAVENOUS
  Filled 2014-09-25: qty 1

## 2014-09-25 MED ORDER — SODIUM CHLORIDE 0.9 % IV BOLUS (SEPSIS)
1000.0000 mL | Freq: Once | INTRAVENOUS | Status: AC
  Administered 2014-09-25: 1000 mL via INTRAVENOUS

## 2014-09-25 MED ORDER — LORAZEPAM 2 MG/ML IJ SOLN
1.0000 mg | Freq: Once | INTRAMUSCULAR | Status: AC
  Administered 2014-09-26: 1 mg via INTRAVENOUS
  Filled 2014-09-25: qty 1

## 2014-09-25 MED ORDER — POTASSIUM CHLORIDE CRYS ER 20 MEQ PO TBCR
40.0000 meq | EXTENDED_RELEASE_TABLET | Freq: Once | ORAL | Status: AC
  Administered 2014-09-26: 40 meq via ORAL
  Filled 2014-09-25: qty 2

## 2014-09-25 NOTE — ED Provider Notes (Signed)
TIME SEEN: 11:15 PM  CHIEF COMPLAINT: Chest pain, shortness of breath, anxiety  HPI: Pt is a 24 y.o. male with history of ADHD on Vyvanse who presents emergency department with chest pain, shortness of breath and anxiety. Wife reports that around 8 PM he began complaining of chest pain and shortness of breath. Patient reports that "I don't remember much after that". Describes the pain as a pressure. Denies any recent fever, cough, nausea, vomiting, diarrhea. States that his anxiety has increased recently at it is the anniversary of his father's death. No SI or HI. He states he has chronic visual hallucinations where he will "see something out of the corner of my eye". No command hallucinations. Denies drug or alcohol use.  Denies history of hypertension, diabetes, hyperlipidemia. Quit smoking cigarettes one year ago. Does have a father who had a history of coronary artery disease in his 60s. No history of PE or DVT. No recent prolonged immobilization such as long flight or hospitalization, fracture, surgery, trauma.  ROS: See HPI Constitutional: no fever  Eyes: no drainage  ENT: no runny nose   Cardiovascular:   chest pain  Resp:  SOB  GI: no vomiting GU: no dysuria Integumentary: no rash  Allergy: no hives  Musculoskeletal: no leg swelling  Neurological: no slurred speech ROS otherwise negative  PAST MEDICAL HISTORY/PAST SURGICAL HISTORY:  Past Medical History  Diagnosis Date  . ADHD (attention deficit hyperactivity disorder)   . Unspecified episodic mood disorder   . Acne   . Obsessive-compulsive disorder   . Oppositional defiant disorder     MEDICATIONS:  Prior to Admission medications   Medication Sig Start Date End Date Taking? Authorizing Provider  diphenhydrAMINE (BENADRYL) 25 MG tablet Take 75 mg by mouth once.   Yes Historical Provider, MD  ibuprofen (ADVIL,MOTRIN) 600 MG tablet Take 1 tablet (600 mg total) by mouth every 8 (eight) hours as needed. 08/11/14  Yes Kathyrn Drown, MD  lisdexamfetamine (VYVANSE) 50 MG capsule Take 1 capsule (50 mg total) by mouth daily. 09/02/14  Yes Levonne Spiller, MD  albuterol (PROVENTIL HFA;VENTOLIN HFA) 108 (90 BASE) MCG/ACT inhaler Inhale 2 puffs into the lungs every 6 (six) hours as needed for wheezing. 08/30/14   Kathyrn Drown, MD  tiZANidine (ZANAFLEX) 4 MG tablet Take 1 tablet (4 mg total) by mouth at bedtime as needed for muscle spasms. Patient not taking: Reported on 09/25/2014 06/21/14   Kathyrn Drown, MD  traZODone (DESYREL) 50 MG tablet Take 1 tablet (50 mg total) by mouth at bedtime. 09/02/14   Levonne Spiller, MD    ALLERGIES:  Allergies  Allergen Reactions  . Strattera [Atomoxetine Hcl] Other (See Comments)    Became violent on Strattera.  . Bactrim [Sulfamethoxazole-Trimethoprim] Rash  . Sulfonamide Derivatives Rash    SOCIAL HISTORY:  History  Substance Use Topics  . Smoking status: Former Smoker -- 0.04 packs/day for 3 years    Types: Cigarettes    Quit date: 03/20/2013  . Smokeless tobacco: Never Used     Comment: "I quit again"  . Alcohol Use: No    FAMILY HISTORY: Family History  Problem Relation Age of Onset  . Bipolar disorder Cousin   . Alcohol abuse Paternal Aunt   . Drug abuse Paternal Aunt   . Anxiety disorder Neg Hx   . Dementia Neg Hx   . Paranoid behavior Neg Hx   . Physical abuse Neg Hx   . Schizophrenia Neg Hx   . Seizures Neg  Hx   . Sexual abuse Neg Hx     EXAM: BP 145/93 mmHg  Pulse 109  Temp(Src) 98.4 F (36.9 C) (Oral)  Resp 24  Ht 5\' 6"  (1.676 m)  Wt 170 lb (77.111 kg)  BMI 27.45 kg/m2  SpO2 100% CONSTITUTIONAL: Alert and oriented and responds appropriately to questions. Well-appearing; well-nourished, in no distress HEAD: Normocephalic EYES: Conjunctivae clear, PERRL ENT: normal nose; no rhinorrhea; moist mucous membranes; pharynx without lesions noted NECK: Supple, no meningismus, no LAD  CARD: Regular and tachycardic; S1 and S2 appreciated; no murmurs, no  clicks, no rubs, no gallops; chest wall tender to palpation without crepitus or ecchymosis or deformity RESP: Normal chest excursion without splinting or tachypnea; breath sounds clear and equal bilaterally; no wheezes, no rhonchi, no rales, no hypoxia or respiratory distress, speaking full sentences ABD/GI: Normal bowel sounds; non-distended; soft, non-tender, no rebound, no guarding BACK:  The back appears normal and is non-tender to palpation, there is no CVA tenderness EXT: Normal ROM in all joints; non-tender to palpation; no edema; normal capillary refill; no cyanosis; no calf tenderness or swelling    SKIN: Normal color for age and race; warm NEURO: Moves all extremities equally; cranial nerves II through XII intact, sensation to light touch intact diffusely PSYCH: The patient's mood and manner are appropriate. Grooming and personal hygiene are appropriate. Denies SI or HI.  MEDICAL DECISION MAKING: Pt here with chest pain, shortness of breath likely secondary to anxiety versus musculoskeletal pain. Does have a history of a father who had CAD in his 51s but no other history of premature CAD. EKG shows no ischemic changes.  Labs unremarkable other than slightly low potassium at 3.4. We'll replace. Troponin negative. D-dimer negative. Urine drug screen is positive for amphetamines. Chest x-ray clear. We'll give Toradol, IV fluids, Ativan and reassess.  ED PROGRESS: Patient reports feeling better after above medications. Her rate now in the 90s. We'll discharge home with prescription for Ativan, ibuprofen. Have advised him to follow-up with his PCP Dr. Wolfgang Phoenix if symptoms continue. He has no current safety concerns. We'll discharge home. Discussed return precautions. He verbalizes understanding and is comfortable with plan.      EKG Interpretation  Date/Time:  Saturday September 25 2014 22:25:34 EST Ventricular Rate:  107 PR Interval:  147 QRS Duration: 103 QT Interval:  304 QTC  Calculation: 405 R Axis:   77 Text Interpretation:  Sinus tachycardia No old tracing to compare Confirmed by [place] Fassnacht,  DO, Talishia Betzler [id number]) on 09/25/2014 10:58:07 PM        [place], DO 09/26/14 [id number]

## 2014-09-25 NOTE — ED Notes (Signed)
Patient was complaining of trouble breathing at home per spouse. Said his chest hurt. Patient unable to verbalize anything when presenting to registration.

## 2014-09-25 NOTE — ED Notes (Signed)
Patient states he was playing a video game tonight when he began to have shortness of breath. Upon arrival to ED patient was having a hard time speaking d/t shortness of breath. Patient has rapid shallow breathing. After coaching patient to slow respirations, patient was able to speak a little, still with shallow breathing. Patient states finger tips are numb. A&O X4

## 2014-09-26 LAB — TSH: TSH: 0.887 u[IU]/mL (ref 0.350–4.500)

## 2014-09-26 MED ORDER — IBUPROFEN 800 MG PO TABS: 800.0000 mg | ORAL_TABLET | Freq: Three times a day (TID) | ORAL | Status: DC | PRN

## 2014-09-26 MED ORDER — LORAZEPAM 1 MG PO TABS: 1.0000 mg | ORAL_TABLET | Freq: Three times a day (TID) | ORAL | Status: DC | PRN

## 2014-09-26 NOTE — ED Notes (Signed)
Pt alert & oriented x4, stable gait. Patient given discharge instructions, paperwork & prescription(s). Patient  instructed to stop at the registration desk to finish any additional paperwork. Patient verbalized understanding. Pt left department w/ no further questions. 

## 2014-09-26 NOTE — Discharge Instructions (Signed)
Chest Wall Pain Chest wall pain is pain in or around the bones and muscles of your chest. It may take up to 6 weeks to get better. It may take longer if you must stay physically active in your work and activities.  CAUSES  Chest wall pain may happen on its own. However, it may be caused by:  A viral illness like the flu.  Injury.  Coughing.  Exercise.  Arthritis.  Fibromyalgia.  Shingles. HOME CARE INSTRUCTIONS   Avoid overtiring physical activity. Try not to strain or perform activities that cause pain. This includes any activities using your chest or your abdominal and side muscles, especially if heavy weights are used.  Put ice on the sore area.  Put ice in a plastic bag.  Place a towel between your skin and the bag.  Leave the ice on for 15-20 minutes per hour while awake for the first 2 days.  Only take over-the-counter or prescription medicines for pain, discomfort, or fever as directed by your caregiver. SEEK IMMEDIATE MEDICAL CARE IF:   Your pain increases, or you are very uncomfortable.  You have a fever.  Your chest pain becomes worse.  You have new, unexplained symptoms.  You have nausea or vomiting.  You feel sweaty or lightheaded.  You have a cough with phlegm (sputum), or you cough up blood. MAKE SURE YOU:   Understand these instructions.  Will watch your condition.  Will get help right away if you are not doing well or get worse. Document Released: 08/06/2005 Document Revised: 10/29/2011 Document Reviewed: 04/02/2011 [facility] Patient Information 2015 [place], [place]. This information is not intended to replace advice given to you by your health care provider. Make sure you discuss any questions you have with your health care provider.  Panic Attacks Panic attacks are sudden, short-livedsurges of severe anxiety, fear, or discomfort. They may occur for no reason when you are relaxed, when you are anxious, or when you are sleeping. Panic attacks  may occur for a number of reasons:   Healthy people occasionally have panic attacks in extreme, life-threatening situations, such as war or natural disasters. Normal anxiety is a protective mechanism of the body that helps [place] react to danger (fight or flight response).  Panic attacks are often seen with anxiety disorders, such as panic disorder, social anxiety disorder, generalized anxiety disorder, and phobias. Anxiety disorders cause excessive or uncontrollable anxiety. They may interfere with your relationships or other life activities.  Panic attacks are sometimes seen with other mental illnesses, such as depression and posttraumatic stress disorder.  Certain medical conditions, prescription medicines, and drugs of abuse can cause panic attacks. SYMPTOMS  Panic attacks start suddenly, peak within 20 minutes, and are accompanied by four or more of the following symptoms:  Pounding heart or fast heart rate (palpitations).  Sweating.  Trembling or shaking.  Shortness of breath or feeling smothered.  Feeling choked.  Chest pain or discomfort.  Nausea or strange feeling in your stomach.  Dizziness, light-headedness, or feeling like you will faint.  Chills or hot flushes.  Numbness or tingling in your lips or hands and feet.  Feeling that things are not real or feeling that you are not yourself.  Fear of losing control or going crazy.  Fear of dying. Some of these symptoms can mimic serious medical conditions. For example, you may think you are having a heart attack. Although panic attacks can be very scary, they are not life threatening. DIAGNOSIS  Panic attacks are diagnosed  through an assessment by your health care provider. Your health care provider will ask questions about your symptoms, such as where and when they occurred. Your health care provider will also ask about your medical history and use of alcohol and drugs, including prescription medicines. Your health care  provider may order blood tests or other studies to rule out a serious medical condition. Your health care provider may refer you to a mental health professional for further evaluation. TREATMENT   Most healthy people who have one or two panic attacks in an extreme, life-threatening situation will not require treatment.  The treatment for panic attacks associated with anxiety disorders or other mental illness typically involves counseling with a mental health professional, medicine, or a combination of both. Your health care provider will help determine what treatment is best for you.  Panic attacks due to physical illness usually go away with treatment of the illness. If prescription medicine is causing panic attacks, talk with your health care provider about stopping the medicine, decreasing the dose, or substituting another medicine.  Panic attacks due to alcohol or drug abuse go away with abstinence. Some adults need professional help in order to stop drinking or using drugs. HOME CARE INSTRUCTIONS   Take all medicines as directed by your health care provider.   Schedule and attend follow-up visits as directed by your health care provider. It is important to keep all your appointments. SEEK MEDICAL CARE IF:  You are not able to take your medicines as prescribed.  Your symptoms do not improve or get worse. SEEK IMMEDIATE MEDICAL CARE IF:   You experience panic attack symptoms that are different than your usual symptoms.  You have serious thoughts about hurting yourself or others.  You are taking medicine for panic attacks and have a serious side effect. MAKE SURE YOU:  Understand these instructions.  Will watch your condition.  Will get help right away if you are not doing well or get worse. Document Released: 08/06/2005 Document Revised: 08/11/2013 Document Reviewed: 03/20/2013 [facility] Patient Information 2015 [place], [place]. This information is not intended to replace advice  given to you by your health care provider. Make sure you discuss any questions you have with your health care provider.

## 2014-09-27 ENCOUNTER — Ambulatory Visit (HOSPITAL_COMMUNITY): Payer: Self-pay | Admitting: Psychiatry

## 2014-09-28 ENCOUNTER — Ambulatory Visit (HOSPITAL_COMMUNITY): Payer: Self-pay | Admitting: Psychology

## 2014-10-01 ENCOUNTER — Ambulatory Visit (HOSPITAL_COMMUNITY): Payer: Self-pay | Admitting: Psychiatry

## 2014-10-19 ENCOUNTER — Ambulatory Visit (INDEPENDENT_AMBULATORY_CARE_PROVIDER_SITE_OTHER): Payer: MEDICAID | Admitting: Psychiatry

## 2014-10-19 ENCOUNTER — Encounter (HOSPITAL_COMMUNITY): Payer: Self-pay | Admitting: Psychiatry

## 2014-10-19 VITALS — BP 144/72 | HR 110 | Ht 66.0 in | Wt 170.0 lb

## 2014-10-19 DIAGNOSIS — F902 Attention-deficit hyperactivity disorder, combined type: Secondary | ICD-10-CM

## 2014-10-19 DIAGNOSIS — F39 Unspecified mood [affective] disorder: Secondary | ICD-10-CM

## 2014-10-19 MED ORDER — LISDEXAMFETAMINE DIMESYLATE 50 MG PO CAPS: 50.0000 mg | ORAL_CAPSULE | Freq: Every day | ORAL | Status: DC

## 2014-10-19 NOTE — Progress Notes (Signed)
Patient ID: KEMAL AMORES, male   DOB: [birth year], 24 y.o.   MRN: [id number] Patient ID: AUGUSTUS ZURAWSKI, male   DOB: 1991/08/03, 24 y.o.   MRN: [id number] Patient ID: CHRISTO HAIN, male   DOB: 01/29/1991, 24 y.o.   MRN: [id number] Patient ID: ZACCHEAUS STORLIE, male   DOB: 09/16/1990, 24 y.o.   MRN: [id number] Patient ID: ZANDER INGHAM, male   DOB: 1991-07-22, 24 y.o.   MRN: [id number] Patient ID: DEMONT LINFORD, male   DOB: 1991-03-13, 24 y.o.   MRN: [id number] Patient ID: BRENDON CHRISTOFFEL, male   DOB: 05/10/1991, 24 y.o.   MRN: [id number] Patient ID: JAHDEN SCHARA, male   DOB: April 16, 1991, 24 y.o.   MRN: [id number] Patient ID: DONTEA CORLEW, male   DOB: 1991-06-22, 24 y.o.   MRN: [id number] Patient ID: JAAMAL FAROOQUI, male   DOB: [birth year], 24 y.o.   MRN: [id number] [facility] [id number] Progress Note GIOVONNIE TRETTEL MRN: [id number] DOB: 1991/06/07 Age: 24 y.o.  Date: 10/19/2014 Start Time: 2:55 PM End Time: 3:08 PM  Chief Complaint: Chief Complaint  Patient presents with  . Anxiety  . ADHD  . 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   His mother reports that he was always a very hyperactive child wouldn't listen and acted out. He was constantly in trouble in school. He started on Ritalin at an early age but it made him like a zombie and he lost a lot of weight. Over the years she's been on numerous stimulants. At age 65 his father took custody of him. He did not do well with his father and was very oppositional. He was eventually placed in a group home and went through 3 group homes to his teen years.  At age 89 he was arrested for continued contributing to do the deliquenct of a minor. He was dating a 23 year old girl who ran away from home and was found with him he spent about 40 days in jail but he  claims "turn my life around." Shortly after getting out of jail his father got ill with cancer and he spent a lot of time with him and was glad that they could reunite. The patient states he's currently doing well. He's under good control of his behavior. He's not particularly impulsive and is stable to stay focused. His mother states that he can't work full-time because of some onset something that upsets him he becomes very depressed. For now however he sleeping and eating well and seems to be happy with his current girlfriend. He's had no further legal charges.  The patient returns after 2 months. He was seen in the emergency room about a month ago after he was having chest pain. An MI was ruled out his EKG was normal. He still having substernal pain but it doesn't seem to be cardiac in origin. He does have an inhaler to use which he uses infrequently. He's not been back to his primary doctor because he can't afford the copayment. He states that the Vyvanse is helping him focus and he doesn't have any others complaints about his medications.   History of Present Illness: He has been on Ritalin, Concerta, Focalin.  He has also been on Depakote, Abilify as well as Strattera. Allergies: Allergies  Allergen Reactions  . Strattera [Atomoxetine Hcl] Other (See Comments)    Became violent on Strattera.  . Bactrim [Sulfamethoxazole-Trimethoprim] Rash  . Sulfonamide Derivatives Rash   Medical History: Past Medical History  Diagnosis Date  . ADHD (attention deficit hyperactivity disorder)   . Unspecified episodic mood disorder   . Acne   . Obsessive-compulsive disorder   . Oppositional defiant disorder    Surgical History: Past Surgical History  Procedure Laterality Date  . Tendon lengthening      leg tendon stretch as child  . Tendon lengthening      age 53  . Tooth extraction     Family History: family history includes Alcohol abuse in his paternal aunt; Bipolar disorder in his cousin;  Drug abuse in his paternal aunt. There is no history of Anxiety disorder, Dementia, Paranoid behavior, Physical abuse, Schizophrenia, Seizures, or Sexual abuse. Reviewed today in the office and nothing has changed.  Suicidal Ideation: No Plan Formed: No Patient has means to carry out plan: No  Homicidal Ideation: No Plan Formed: No Patient has means to carry out plan: No  Review of Systems: Psychiatric: Agitation: No Hallucination: No Depressed Mood: No Insomnia: No Hypersomnia: No Altered Concentration: No Feels Worthless: No Grandiose Ideas: No Belief In Special Powers: No New/Increased Substance Abuse: No Compulsions: No  Neurologic: Headache: No Seizure: No Paresthesias: No  Past Medical Family, Social History: cHe has finished his course work at the [employer]. He is currently living with a friend Patient says that he's dating an 7 year old.  Family History family history includes Alcohol abuse in his paternal aunt; Bipolar disorder in his cousin; Drug abuse in his paternal aunt. There is no history of Anxiety disorder, Dementia, Paranoid behavior, Physical abuse, Schizophrenia, Seizures, or Sexual abuse.  Medications: Outpatient Encounter Prescriptions as of 10/19/2014  Medication Sig  . albuterol (PROVENTIL HFA;VENTOLIN HFA) 108 (90 BASE) MCG/ACT inhaler Inhale 2 puffs into the lungs every 6 (six) hours as needed for wheezing.  Marland Kitchen ibuprofen (ADVIL,MOTRIN) 800 MG tablet Take 1 tablet (800 mg total) by mouth every 8 (eight) hours as needed for mild pain.  Marland Kitchen lisdexamfetamine (VYVANSE) 50 MG capsule Take 1 capsule (50 mg total) by mouth daily.  . [DISCONTINUED] lisdexamfetamine (VYVANSE) 50 MG capsule Take 1 capsule (50 mg total) by mouth daily.  . diphenhydrAMINE (BENADRYL) 25 MG tablet Take 75 mg by mouth once.  . lisdexamfetamine (VYVANSE) 50 MG capsule Take 1 capsule (50 mg total) by mouth daily.  Marland Kitchen lisdexamfetamine (VYVANSE) 50 MG capsule Take 1 capsule (50 mg total) by  mouth daily.  Marland Kitchen LORazepam (ATIVAN) 1 MG tablet Take 1 tablet (1 mg total) by mouth 3 (three) times daily as needed for anxiety. (Patient not taking: Reported on 10/19/2014)  . tiZANidine (ZANAFLEX) 4 MG tablet Take 1 tablet (4 mg total) by mouth at bedtime as needed for muscle spasms. (Patient not taking: Reported on 09/25/2014)  . traZODone (DESYREL) 50 MG tablet Take 1 tablet (50 mg total) by mouth at bedtime. (Patient not taking: Reported on 10/19/2014)    Past Psychiatric History/Hospitalization(s): Anxiety: No Bipolar Disorder: Yes Depression: Yes Mania: No Psychosis: No Schizophrenia: No Personality Disorder: No Hospitalization for psychiatric illness: Yes History of Electroconvulsive Shock Therapy: No Prior Suicide Attempts: No  Physical Exam: Constitutional:  BP 144/72 mmHg  Pulse 110  Ht 5\' 6"  (1.676 m)  Wt 170 lb (77.111 kg)  BMI 27.45 kg/m2  General Appearance alert oriented   Musculoskeletal: Strength & Muscle Tone: within  normal limits Gait & Station: normal Patient leans: N/A  Psychiatric: Speech (describe rate, volume, coherence, spontaneity, and abnormalities if any): Normal in volume rate and tone spontaneous  Thought Process (describe rate, content, abstract reasoning, and computation): Organized, goal-directed, age-appropriate  Associations: Intact  Thoughts: normal  Mental Status: Orientation: oriented to person, place, time/date and situation Mood & Affect: Irritable, hyper talkative Attention Span & Concentration:improved  Lab Results:  Results for orders placed or performed during the hospital encounter of 09/25/14 (from the past 8736 hour(s))  CBC   Collection Time: 09/25/14 10:16 PM  Result Value Ref Range   WBC 10.2 4.0 - 10.5 K/uL   RBC 5.34 4.22 - 5.81 MIL/uL   Hemoglobin 15.9 13.0 - 17.0 g/dL   HCT 45.9 39.0 - 52.0 %   MCV 86.0 78.0 - 100.0 fL   MCH 29.8 26.0 - 34.0 pg   MCHC 34.6 30.0 - 36.0 g/dL   RDW 13.7 11.5 - 15.5 %   Platelets  263 150 - 400 K/uL  Basic metabolic panel   Collection Time: 09/25/14 10:16 PM  Result Value Ref Range   Sodium 139 135 - 145 mmol/L   Potassium 3.4 (L) 3.5 - 5.1 mmol/L   Chloride 106 96 - 112 mmol/L   CO2 25 19 - 32 mmol/L   Glucose, Bld 111 (H) 70 - 99 mg/dL   BUN 14 6 - 23 mg/dL   Creatinine, Ser 0.86 0.50 - 1.35 mg/dL   Calcium 9.5 8.4 - 10.5 mg/dL   GFR calc non Af Amer >90 >90 mL/min   GFR calc Af Amer >90 >90 mL/min   Anion gap 8 5 - 15  Troponin I ([facility])   Collection Time: 09/25/14 10:16 PM  Result Value Ref Range   Troponin I <0.03 <0.031 ng/mL  D-dimer, quantitative   Collection Time: 09/25/14 10:27 PM  Result Value Ref Range   D-Dimer, Quant <0.27 0.00 - 0.48 ug/mL-FEU  TSH   Collection Time: 09/25/14 10:44 PM  Result Value Ref Range   TSH 0.887 0.350 - 4.500 uIU/mL  Urine rapid drug screen (hosp performed)   Collection Time: 09/25/14 10:54 PM  Result Value Ref Range   Opiates NONE DETECTED NONE DETECTED   Cocaine NONE DETECTED NONE DETECTED   Benzodiazepines NONE DETECTED NONE DETECTED   Amphetamines POSITIVE (A) NONE DETECTED   Tetrahydrocannabinol NONE DETECTED NONE DETECTED   Barbiturates NONE DETECTED NONE DETECTED   PCP draws routine labs and nothing is emerging as of concern.  Assessment: Axis I: Mood disorder NOS, ADHD combined type    Axis II: Deferred  Axis III: Wears glasses, acne, Hx scabes  Axis IV: Moderate  Axis V: 65  Plan: I took his vitals.  I reviewed CC, tobacco/med/surg Hx, meds effects/ side effects, problem list, therapies and responses as well as current situation/symptoms discussed options. He will continue Vyvanse to 50 mg every morning . He'll return in 3 months See orders and pt instructions for more details.  MEDICATIONS this encounter: Meds ordered this encounter  Medications  . lisdexamfetamine (VYVANSE) 50 MG capsule    Sig: Take 1 capsule (50 mg total) by mouth daily.    Dispense:  30 capsule    Refill:  0  .  lisdexamfetamine (VYVANSE) 50 MG capsule    Sig: Take 1 capsule (50 mg total) by mouth daily.    Dispense:  30 capsule    Refill:  0    Do not fill before 11/19/14  .  lisdexamfetamine (VYVANSE) 50 MG capsule    Sig: Take 1 capsule (50 mg total) by mouth daily.    Dispense:  30 capsule    Refill:  0    Do not fill before 12/19/14   Medical Decision Making Problem Points:  Established problem, stable/improving (1), Review of last therapy session (1), Review of psycho-social stressors (1) and Self-limited or minor (1) Data Points:  Review or order clinical lab tests (1) Review of medication regiment & side effects (2)  I certify that outpatient services furnished can reasonably be expected to improve the patient's condition.   Levonne Spiller, MD

## 2014-10-20 ENCOUNTER — Ambulatory Visit (INDEPENDENT_AMBULATORY_CARE_PROVIDER_SITE_OTHER): Payer: MEDICAID | Admitting: Psychology

## 2014-10-20 ENCOUNTER — Encounter (HOSPITAL_COMMUNITY): Payer: Self-pay | Admitting: Psychology

## 2014-10-20 DIAGNOSIS — F39 Unspecified mood [affective] disorder: Secondary | ICD-10-CM

## 2014-10-20 DIAGNOSIS — F902 Attention-deficit hyperactivity disorder, combined type: Secondary | ICD-10-CM

## 2014-10-20 NOTE — Progress Notes (Signed)
      PROGRESS NOTE  Patient:  [name]   DOB: [birth year]  MR Number: [id number]  Location: [facility] [address] [place] [address] Dept: [phone]  Start: 1 PM End: 2 PM  Provider/Observer:     Edgardo Roys PSYD  Chief Complaint:      Chief Complaint  Patient presents with  . ADHD  . Anxiety  . Depression  . Stress    Reason For Service:     The patient was referred by his treating psychiatrist Dr. Dwyane Dee, who felt that he needed to engage in some psychotherapeutic interventions and building some of his coping skills. The patient has been seen by Dr. Dwyane Dee through the [facility] mental health Department and then started seeing her here through [place]. He is carried a number of diagnoses including conduct disorder with adolescent onset, attention deficit disorder with hyperactivity, and mood disorder NOS. He has been tried on a number of different medications including Concerta and Abilify as well as Depakote and Adderall. He does appear to be responding well to his current medications but there was a concern or interest in helping build more coping skills to do is reduced intellectual capacity and poor coping skills.  Interventions Strategy:  Cognitive/behavioral psychotherapy  Participation Level:   Active  Participation Quality:  Appropriate      Behavioral Observation:  Well Groomed, Alert, and Appropriate.   Current Psychosocial Factors: The patient comes in today along with his wife.  Patient reports that mood and been a little better with agitiation.  Had situation that lead to ED with chest pain, alteren consciousness, and severe anxiety      Content of Session:   Reviewed current symptoms and continue to work on building coping skills and strategies.  Current Status:   Patient is coping with a new born and a new wife.  He has not done well with  anxiety.  Patient Progress:   Stable  Target Goals:   Reduce impulsive and maladaptive behaviors and engage in better use of coping skills.  Last Reviewed:   10/20/2014  Goals Addressed Today:    We continue to work on target goals of reducing the impulsive and manipulative behaviors and better at implementation of coping skills for judgment and future planning.  Impression/Diagnosis:   The patient has been diagnosed with a number of psychiatric conditions including mood disorder, attention deficit disorder, conduct disorder and other variations of these conditions. I do think that the most appropriate diagnosis at this time is one of attention deficit disorder combined type with the continued rule out of a mood disorder. I have not seen the patient in any significant fluctuation in mood state since I started seeing him. His mood is always been quite cheerful and pleasant with no indications of manic or hypomanic episodes or depressive episodes to this point.  Diagnosis:    Axis I:  ADHD (attention deficit hyperactivity disorder), combined type  Mood disorder      Lynisha Osuch R, PsyD 10/20/2014

## 2014-11-26 ENCOUNTER — Encounter (HOSPITAL_COMMUNITY): Payer: Self-pay | Admitting: Psychology

## 2014-11-26 ENCOUNTER — Ambulatory Visit (INDEPENDENT_AMBULATORY_CARE_PROVIDER_SITE_OTHER): Payer: MEDICAID | Admitting: Psychology

## 2014-11-26 DIAGNOSIS — F902 Attention-deficit hyperactivity disorder, combined type: Secondary | ICD-10-CM | POA: Diagnosis not present

## 2014-11-26 DIAGNOSIS — F39 Unspecified mood [affective] disorder: Secondary | ICD-10-CM | POA: Diagnosis not present

## 2014-11-26 NOTE — Progress Notes (Signed)
      PROGRESS NOTE  Patient:  [name]   DOB: 05/31/1991  MR Number: [id number]  Location: [facility] [address] [place] [address] Dept: [phone]  Start: 1 PM End: 2 PM  Provider/Observer:     Edgardo Roys PSYD  Chief Complaint:      Chief Complaint  Patient presents with  . Agitation  . Anxiety  . Depression    Reason For Service:     The patient was referred by his treating psychiatrist Dr. Dwyane Dee, who felt that he needed to engage in some psychotherapeutic interventions and building some of his coping skills. The patient has been seen by Dr. Dwyane Dee through the [facility] mental health Department and then started seeing her here through [place]. He is carried a number of diagnoses including conduct disorder with adolescent onset, attention deficit disorder with hyperactivity, and mood disorder NOS. He has been tried on a number of different medications including Concerta and Abilify as well as Depakote and Adderall. He does appear to be responding well to his current medications but there was a concern or interest in helping build more coping skills to do is reduced intellectual capacity and poor coping skills.  Interventions Strategy:  Cognitive/behavioral psychotherapy  Participation Level:   Active  Participation Quality:  Appropriate      Behavioral Observation:  Well Groomed, Alert, and Appropriate.   Current Psychosocial Factors: The patient comes in today along with his wife.  The patient reports that he has been in a lot of stress due to financial issues.     Content of Session:   Reviewed current symptoms and continue to work on building coping skills and strategies.  Current Status:   Patient is coping with a new born and a new wife.  He has not done well with anxiety.  Patient Progress:   Stable  Target Goals:   Reduce impulsive and  maladaptive behaviors and engage in better use of coping skills.  Last Reviewed:   11/26/2014  Goals Addressed Today:    We continue to work on target goals of reducing the impulsive and manipulative behaviors and better at implementation of coping skills for judgment and future planning.  Impression/Diagnosis:   The patient has been diagnosed with a number of psychiatric conditions including mood disorder, attention deficit disorder, conduct disorder and other variations of these conditions. I do think that the most appropriate diagnosis at this time is one of attention deficit disorder combined type with the continued rule out of a mood disorder. I have not seen the patient in any significant fluctuation in mood state since I started seeing him. His mood is always been quite cheerful and pleasant with no indications of manic or hypomanic episodes or depressive episodes to this point.  Diagnosis:    Axis I:  ADHD (attention deficit hyperactivity disorder), combined type  Mood disorder      Kesa Birky R, PsyD 11/26/2014

## 2014-12-24 ENCOUNTER — Ambulatory Visit (INDEPENDENT_AMBULATORY_CARE_PROVIDER_SITE_OTHER): Payer: MEDICAID | Admitting: Psychology

## 2014-12-24 DIAGNOSIS — F902 Attention-deficit hyperactivity disorder, combined type: Secondary | ICD-10-CM

## 2014-12-24 DIAGNOSIS — F39 Unspecified mood [affective] disorder: Secondary | ICD-10-CM | POA: Diagnosis not present

## 2014-12-27 ENCOUNTER — Encounter (HOSPITAL_COMMUNITY): Payer: Self-pay | Admitting: Psychology

## 2014-12-27 NOTE — Progress Notes (Signed)
      PROGRESS NOTE  Patient:  [name]   DOB: 07-Aug-1991  MR Number: [id number]  Location: [facility] [address] [place] [address] Dept: [phone]  Start: 2 PM End: 3 PM  Provider/Observer:     Edgardo Roys PSYD  Chief Complaint:      Chief Complaint  Patient presents with  . ADHD  . Depression  . Anxiety    Reason For Service:     The patient was referred by his treating psychiatrist Dr. Dwyane Dee, who felt that he needed to engage in some psychotherapeutic interventions and building some of his coping skills. The patient has been seen by Dr. Dwyane Dee through the [facility] mental health Department and then started seeing her here through [facility]. He is carried a number of diagnoses including conduct disorder with adolescent onset, attention deficit disorder with hyperactivity, and mood disorder NOS. He has been tried on a number of different medications including Concerta and Abilify as well as Depakote and Adderall. He does appear to be responding well to his current medications but there was a concern or interest in helping build more coping skills to do is reduced intellectual capacity and poor coping skills.  Interventions Strategy:  Cognitive/behavioral psychotherapy  Participation Level:   Active  Participation Quality:  Appropriate      Behavioral Observation:  Well Groomed, Alert, and Appropriate.   Current Psychosocial Factors: The patient reports that he has wife have been doing better and there is been no arguing or distress within their relationship. His wife is about 2 months away from giving birth. The patient reports that he is struggling mildly financially and still has almost a year and a half to wait regarding his disability/SSI hearing. The patient reports that he has no income.     Content of Session:   Reviewed current symptoms and continue  to work on building coping skills and strategies.  Current Status:   Patient reports that his mood stability has been good recently but issues of depression anxiety continue to be present especially with his severe financial limitations and the fact that family members are essentially supporting he and his wife and child..  Patient Progress:   Stable  Target Goals:   Reduce impulsive and maladaptive behaviors and engage in better use of coping skills.  Last Reviewed:   12/24/2014  Goals Addressed Today:    We continue to work on target goals of reducing the impulsive and manipulative behaviors and better at implementation of coping skills for judgment and future planning.  Impression/Diagnosis:   The patient has been diagnosed with a number of psychiatric conditions including mood disorder, attention deficit disorder, conduct disorder and other variations of these conditions. I do think that the most appropriate diagnosis at this time is one of attention deficit disorder combined type with the continued rule out of a mood disorder. I have not seen the patient in any significant fluctuation in mood state since I started seeing him. His mood is always been quite cheerful and pleasant with no indications of manic or hypomanic episodes or depressive episodes to this point.  Diagnosis:    Axis I:  ADHD (attention deficit hyperactivity disorder), combined type  Mood disorder      RODENBOUGH,JOHN R, PsyD 12/27/2014

## 2015-01-14 ENCOUNTER — Encounter: Payer: Self-pay | Admitting: Nurse Practitioner

## 2015-01-14 ENCOUNTER — Ambulatory Visit (INDEPENDENT_AMBULATORY_CARE_PROVIDER_SITE_OTHER): Payer: Medicaid Other | Admitting: Nurse Practitioner

## 2015-01-14 VITALS — BP 126/80 | Temp 98.3°F | Ht 66.0 in | Wt 174.0 lb

## 2015-01-14 DIAGNOSIS — L500 Allergic urticaria: Secondary | ICD-10-CM | POA: Diagnosis not present

## 2015-01-14 MED ORDER — RANITIDINE HCL 150 MG PO CAPS: ORAL_CAPSULE | ORAL | Status: DC

## 2015-01-14 MED ORDER — PREDNISONE 20 MG PO TABS: ORAL_TABLET | ORAL | Status: DC

## 2015-01-14 MED ORDER — METHYLPREDNISOLONE ACETATE 40 MG/ML IJ SUSP
40.0000 mg | Freq: Once | INTRAMUSCULAR | Status: AC
  Administered 2015-01-14: 40 mg via INTRAMUSCULAR

## 2015-01-14 NOTE — Patient Instructions (Signed)
Benadryl 25 one every 4 hours Ranitidine one each day

## 2015-01-17 ENCOUNTER — Encounter: Payer: Self-pay | Admitting: Nurse Practitioner

## 2015-01-17 NOTE — Progress Notes (Signed)
Subjective:  Presents for c/o whelps that began this am. Started on buttocks and backs of thighs. Now on arms. Very pruritic. No known allergens. No known contacts. No difficulty breathing or swallowing. No wheezing. No fever. No headache. Some slight swelling and itching at left hand, starting between fingers. Had tick bite on back of left leg several days ago.   Objective:   BP 126/80 mmHg  Temp(Src) 98.3 F (36.8 C) (Oral)  Ht 5\' 6"  (1.676 m)  Wt 174 lb (78.926 kg)  BMI 28.10 kg/m2 NAD. Alert, oriented. TMs mild clear effusion. Pharynx clear. Neck supple with mild anterior adenopathy. Lungs clear. Heart RRR. Buttocks and posterior thighs: a few scattered papules from trimming leg hair. No pustules. One small pink papule posterior left leg at site of tick bite. A few scattered hives on arms.   Assessment: Allergic urticaria - Plan: methylPREDNISolone acetate (DEPO-MEDROL) injection 40 mg  Plan:  Meds ordered this encounter  Medications  . predniSONE (DELTASONE) 20 MG tablet    Sig: 3 po qd x 3 d then 2 po qd x 3 d then 1 po qd x 3 d; start 5/28 am    Dispense:  18 tablet    Refill:  0    Order Specific Question:  Supervising Provider    Answer:  Mikey Kirschner [[id number]]  . methylPREDNISolone acetate (DEPO-MEDROL) injection 40 mg    Sig:   . ranitidine (ZANTAC) 150 MG capsule    Sig: One po qd prn    Dispense:  30 capsule    Refill:  0    Order Specific Question:  Supervising Provider    Answer:  Mikey Kirschner [[id number]]   Benadryl as directed. Warning signs reviewed. Go to ED over weekend if worse. Call back if persists.

## 2015-01-19 ENCOUNTER — Encounter (HOSPITAL_COMMUNITY): Payer: Self-pay | Admitting: Psychiatry

## 2015-01-19 ENCOUNTER — Ambulatory Visit (INDEPENDENT_AMBULATORY_CARE_PROVIDER_SITE_OTHER): Payer: MEDICAID | Admitting: Psychiatry

## 2015-01-19 VITALS — BP 129/85 | HR 90 | Ht 66.0 in | Wt 177.4 lb

## 2015-01-19 DIAGNOSIS — F39 Unspecified mood [affective] disorder: Secondary | ICD-10-CM

## 2015-01-19 DIAGNOSIS — F902 Attention-deficit hyperactivity disorder, combined type: Secondary | ICD-10-CM | POA: Diagnosis not present

## 2015-01-19 MED ORDER — LISDEXAMFETAMINE DIMESYLATE 50 MG PO CAPS: 50.0000 mg | ORAL_CAPSULE | Freq: Every day | ORAL | Status: DC

## 2015-01-19 NOTE — Progress Notes (Signed)
Patient ID: [name], male   DOB: [birth year], 24 y.o.   MRN: [id number] Patient ID: [name], male   DOB: Sep 11, 1990, 24 y.o.   MRN: [id number] Patient ID: [name], male   DOB: [birth year], 24 y.o.   MRN: [id number] Patient ID: [name], male   DOB: [birth year], 24 y.o.   MRN: [id number] Patient ID: [name], male   DOB: [birth year], 24 y.o.   MRN: [id number] Patient ID: [name], male   DOB: 1991/02/14, 24 y.o.   MRN: [id number] Patient ID: [name], male   DOB: [birth year], 24 y.o.   MRN: [id number] Patient ID: [name], male   DOB: 1991-04-23, 24 y.o.   MRN: [id number] Patient ID: [name], male   DOB: 12-17-1990, 24 y.o.   MRN: [id number] Patient ID: [name], male   DOB: 03-23-1991, 24 y.o.   MRN: [id number] Patient ID: [name], male   DOB: 14-May-1991, 24 y.o.   MRN: [id number] [facility] [address] Progress Note [name] MRN: [id number] DOB: May 13, 1991 Age: 24 y.o.  Date: 01/19/2015 Start Time: 2:55 PM End Time: 3:08 PM  Chief Complaint: Chief Complaint  Patient presents with  . ADHD  . Follow-up    Subjective: "I'm doing well  This patient is a 24 year old single white male who has been living with his wife and baby girl in [place] He is trying to reestablish disability   His mother reports that he was always a very hyperactive child wouldn't listen and acted out. He was constantly in trouble in school. He started on Ritalin at an early age but it made him like a zombie and he lost a lot of weight. Over the years she's been on numerous stimulants. At age 24 his father took custody of him. He did not do well with his father and was very oppositional. He was eventually placed in a group home and went through 3 group homes to his teen years.  At age 24 he was arrested for continued contributing to do the deliquenct of a minor. He was dating a 23 year old girl who ran away from home and was  found with him he spent about 40 days in jail but he claims "turn my life around." Shortly after getting out of jail his father got ill with cancer and he spent a lot of time with him and was glad that they could reunite. The patient states he's currently doing well. He's under good control of his behavior. He's not particularly impulsive and is stable to stay focused. His mother states that he can't work full-time because of some onset something that upsets him he becomes very depressed. For now however he sleeping and eating well and seems to be happy with his wife He's had no further legal charges.  The patient returns after 3 months. His wife is expecting their second child any day now. He is getting the nursery ready. He is still not working but trying to reestablish disability for himself. He's focusing on the Vyvanse denies any significant problems with it. He states his mood is good and he sometimes has occasional difficulty sleeping   History of Present Illness: He has been on Ritalin, Concerta, Focalin.  He has also been on Depakote, Abilify as well as Strattera. Allergies: Allergies  Allergen Reactions  . Strattera [Atomoxetine Hcl] Other (See Comments)    Became violent on Strattera.  . Bactrim [Sulfamethoxazole-Trimethoprim] Rash  . Sulfonamide  Derivatives Rash   Medical History: Past Medical History  Diagnosis Date  . ADHD (attention deficit hyperactivity disorder)   . Unspecified episodic mood disorder   . Acne   . Obsessive-compulsive disorder   . Oppositional defiant disorder    Surgical History: Past Surgical History  Procedure Laterality Date  . Tendon lengthening      leg tendon stretch as child  . Tendon lengthening      age 24  . Tooth extraction     Family History: family history includes Alcohol abuse in his paternal aunt; Bipolar disorder in his cousin; Drug abuse in his paternal aunt. There is no history of Anxiety disorder, Dementia, Paranoid behavior,  Physical abuse, Schizophrenia, Seizures, or Sexual abuse. Reviewed today in the office and nothing has changed.  Suicidal Ideation: No Plan Formed: No Patient has means to carry out plan: No  Homicidal Ideation: No Plan Formed: No Patient has means to carry out plan: No  Review of Systems: Psychiatric: Agitation: No Hallucination: No Depressed Mood: No Insomnia: No Hypersomnia: No Altered Concentration: No Feels Worthless: No Grandiose Ideas: No Belief In Special Powers: No New/Increased Substance Abuse: No Compulsions: No  Neurologic: Headache: No Seizure: No Paresthesias: No    Family History family history includes Alcohol abuse in his paternal aunt; Bipolar disorder in his cousin; Drug abuse in his paternal aunt. There is no history of Anxiety disorder, Dementia, Paranoid behavior, Physical abuse, Schizophrenia, Seizures, or Sexual abuse.  Medications: Outpatient Encounter Prescriptions as of 01/19/2015  Medication Sig  . albuterol (PROVENTIL HFA;VENTOLIN HFA) 108 (90 BASE) MCG/ACT inhaler Inhale 2 puffs into the lungs every 6 (six) hours as needed for wheezing.  . diphenhydrAMINE (BENADRYL) 25 MG tablet Take 75 mg by mouth as needed.   Marland Kitchen ibuprofen (ADVIL,MOTRIN) 800 MG tablet Take 1 tablet (800 mg total) by mouth every 8 (eight) hours as needed for mild pain. (Patient taking differently: Take 600 mg by mouth every 8 (eight) hours as needed for mild pain. )  . lisdexamfetamine (VYVANSE) 50 MG capsule Take 1 capsule (50 mg total) by mouth daily.  . [DISCONTINUED] lisdexamfetamine (VYVANSE) 50 MG capsule Take 1 capsule (50 mg total) by mouth daily.  Marland Kitchen lisdexamfetamine (VYVANSE) 50 MG capsule Take 1 capsule (50 mg total) by mouth daily.  Marland Kitchen lisdexamfetamine (VYVANSE) 50 MG capsule Take 1 capsule (50 mg total) by mouth daily.  . [DISCONTINUED] lisdexamfetamine (VYVANSE) 50 MG capsule Take 1 capsule (50 mg total) by mouth daily. (Patient not taking: Reported on 01/19/2015)  .  [DISCONTINUED] lisdexamfetamine (VYVANSE) 50 MG capsule Take 1 capsule (50 mg total) by mouth daily. (Patient not taking: Reported on 01/19/2015)  . [DISCONTINUED] LORazepam (ATIVAN) 1 MG tablet Take 1 tablet (1 mg total) by mouth 3 (three) times daily as needed for anxiety. (Patient not taking: Reported on 10/19/2014)  . [DISCONTINUED] predniSONE (DELTASONE) 20 MG tablet 3 po qd x 3 d then 2 po qd x 3 d then 1 po qd x 3 d; start 5/28 am (Patient not taking: Reported on 01/19/2015)  . [DISCONTINUED] ranitidine (ZANTAC) 150 MG capsule One po qd prn (Patient not taking: Reported on 01/19/2015)  . [DISCONTINUED] tiZANidine (ZANAFLEX) 4 MG tablet Take 1 tablet (4 mg total) by mouth at bedtime as needed for muscle spasms. (Patient not taking: Reported on 09/25/2014)  . [DISCONTINUED] traZODone (DESYREL) 50 MG tablet Take 1 tablet (50 mg total) by mouth at bedtime. (Patient not taking: Reported on 10/19/2014)   No facility-administered encounter medications  on file as of 01/19/2015.    Past Psychiatric History/Hospitalization(s): Anxiety: No Bipolar Disorder: Yes Depression: Yes Mania: No Psychosis: No Schizophrenia: No Personality Disorder: No Hospitalization for psychiatric illness: Yes History of Electroconvulsive Shock Therapy: No Prior Suicide Attempts: No  Physical Exam: Constitutional:  BP 129/85 mmHg  Pulse 90  Ht 5\' 6"  (1.676 m)  Wt 177 lb 6.4 oz (80.468 kg)  BMI 28.65 kg/m2  General Appearance alert oriented   Musculoskeletal: Strength & Muscle Tone: within normal limits Gait & Station: normal Patient leans: N/A  Psychiatric: Speech (describe rate, volume, coherence, spontaneity, and abnormalities if any): Normal in volume rate and tone spontaneous  Thought Process (describe rate, content, abstract reasoning, and computation): Organized, goal-directed, age-appropriate  Associations: Intact  Thoughts: normal  Mental Status: Orientation: oriented to person, place, time/date and  situation Mood & Affect: Mood is good, affect bright hyper talkative Attention Span & Concentration:improved  Lab Results:  Results for orders placed or performed during the hospital encounter of 09/25/14 (from the past 8736 hour(s))  CBC   Collection Time: 09/25/14 10:16 PM  Result Value Ref Range   WBC 10.2 4.0 - 10.5 K/uL   RBC 5.34 4.22 - 5.81 MIL/uL   Hemoglobin 15.9 13.0 - 17.0 g/dL   HCT 45.9 39.0 - 52.0 %   MCV 86.0 78.0 - 100.0 fL   MCH 29.8 26.0 - 34.0 pg   MCHC 34.6 30.0 - 36.0 g/dL   RDW 13.7 11.5 - 15.5 %   Platelets 263 150 - 400 K/uL  Basic metabolic panel   Collection Time: 09/25/14 10:16 PM  Result Value Ref Range   Sodium 139 135 - 145 mmol/L   Potassium 3.4 (L) 3.5 - 5.1 mmol/L   Chloride 106 96 - 112 mmol/L   CO2 25 19 - 32 mmol/L   Glucose, Bld 111 (H) 70 - 99 mg/dL   BUN 14 6 - 23 mg/dL   Creatinine, Ser 0.86 0.50 - 1.35 mg/dL   Calcium 9.5 8.4 - 10.5 mg/dL   GFR calc non Af Amer >90 >90 mL/min   GFR calc Af Amer >90 >90 mL/min   Anion gap 8 5 - 15  Troponin I (MHP)   Collection Time: 09/25/14 10:16 PM  Result Value Ref Range   Troponin I <0.03 <0.031 ng/mL  D-dimer, quantitative   Collection Time: 09/25/14 10:27 PM  Result Value Ref Range   D-Dimer, Quant <0.27 0.00 - 0.48 ug/mL-FEU  TSH   Collection Time: 09/25/14 10:44 PM  Result Value Ref Range   TSH 0.887 0.350 - 4.500 uIU/mL  Urine rapid drug screen (hosp performed)   Collection Time: 09/25/14 10:54 PM  Result Value Ref Range   Opiates NONE DETECTED NONE DETECTED   Cocaine NONE DETECTED NONE DETECTED   Benzodiazepines NONE DETECTED NONE DETECTED   Amphetamines POSITIVE (A) NONE DETECTED   Tetrahydrocannabinol NONE DETECTED NONE DETECTED   Barbiturates NONE DETECTED NONE DETECTED   PCP draws routine labs and nothing is emerging as of concern.  Assessment: Axis I: Mood disorder NOS, ADHD combined type    Axis II: Deferred  Axis III: Wears glasses, acne, Hx scabes  Axis IV:  Moderate  Axis V: 65  Plan: I took his vitals.  I reviewed CC, tobacco/med/surg Hx, meds effects/ side effects, problem list, therapies and responses as well as current situation/symptoms discussed options. He will continue Vyvanse to 50 mg every morning ADHD symptoms. He'll return in 3 months See orders and pt instructions  for more details.  MEDICATIONS this encounter: Meds ordered this encounter  Medications  . lisdexamfetamine (VYVANSE) 50 MG capsule    Sig: Take 1 capsule (50 mg total) by mouth daily.    Dispense:  30 capsule    Refill:  0  . lisdexamfetamine (VYVANSE) 50 MG capsule    Sig: Take 1 capsule (50 mg total) by mouth daily.    Dispense:  30 capsule    Refill:  0    Do not fill before 02/18/15  . lisdexamfetamine (VYVANSE) 50 MG capsule    Sig: Take 1 capsule (50 mg total) by mouth daily.    Dispense:  30 capsule    Refill:  0    Do not fill before 03/21/15   Medical Decision Making Problem Points:  Established problem, stable/improving (1), Review of last therapy session (1), Review of psycho-social stressors (1) and Self-limited or minor (1) Data Points:  Review or order clinical lab tests (1) Review of medication regiment & side effects (2)  I certify that outpatient services furnished can reasonably be expected to improve the patient's condition.   Levonne Spiller, MD

## 2015-01-21 ENCOUNTER — Ambulatory Visit (HOSPITAL_COMMUNITY): Payer: Self-pay | Admitting: Psychology

## 2015-02-15 ENCOUNTER — Ambulatory Visit (HOSPITAL_COMMUNITY): Payer: Self-pay | Admitting: Psychology

## 2015-02-28 ENCOUNTER — Ambulatory Visit (INDEPENDENT_AMBULATORY_CARE_PROVIDER_SITE_OTHER): Payer: Medicaid Other | Admitting: Psychology

## 2015-02-28 DIAGNOSIS — F42 Obsessive-compulsive disorder: Secondary | ICD-10-CM

## 2015-02-28 DIAGNOSIS — F902 Attention-deficit hyperactivity disorder, combined type: Secondary | ICD-10-CM

## 2015-02-28 DIAGNOSIS — F429 Obsessive-compulsive disorder, unspecified: Secondary | ICD-10-CM

## 2015-02-28 DIAGNOSIS — F39 Unspecified mood [affective] disorder: Secondary | ICD-10-CM

## 2015-02-28 NOTE — Progress Notes (Signed)
      PROGRESS NOTE  Patient:  [name]   DOB: 12/23/1990  MR Number: [id number]  Location: [facility] [address] [place] [address] Dept: [phone]  Start: 2 PM End: 3 PM  Provider/Observer:     Edgardo Roys PSYD  Chief Complaint:      Chief Complaint  Patient presents with  . Agitation  . Depression  . Anxiety  . Stress  . Trauma    Reason For Service:     The patient was referred by his treating psychiatrist Dr. Dwyane Dee, who felt that he needed to engage in some psychotherapeutic interventions and building some of his coping skills. The patient has been seen by Dr. Dwyane Dee through the [facility] mental health Department and then started seeing her here through [facility]. He is carried a number of diagnoses including conduct disorder with adolescent onset, attention deficit disorder with hyperactivity, and mood disorder NOS. He has been tried on a number of different medications including Concerta and Abilify as well as Depakote and Adderall. He does appear to be responding well to his current medications but there was a concern or interest in helping build more coping skills to do is reduced intellectual capacity and poor coping skills.  Interventions Strategy:  Cognitive/behavioral psychotherapy  Participation Level:   Active  Participation Quality:  Appropriate      Behavioral Observation:  Well Groomed, Alert, and Appropriate.   Current Psychosocial Factors: The patient reports that his son was born a few weeks ago and he has been doing well.  Has had times of aggitation and stress.     Content of Session:   Reviewed current symptoms and continue to work on building coping skills and strategies.  Current Status:   Patient reports that his mood stability has been more variable lately.  The patient has not been able to work and has not gotten his  disability.  Struggled with bills and loss of car.  Patient Progress:   Stable  Target Goals:   Reduce impulsive and maladaptive behaviors and engage in better use of coping skills.  Last Reviewed:   02/28/2015  Goals Addressed Today:    We continue to work on target goals of reducing the impulsive and manipulative behaviors and better at implementation of coping skills for judgment and future planning.  Impression/Diagnosis:   The patient has been diagnosed with a number of psychiatric conditions including mood disorder, attention deficit disorder, conduct disorder and other variations of these conditions. I do think that the most appropriate diagnosis at this time is one of attention deficit disorder combined type with the continued rule out of a mood disorder. I have not seen the patient in any significant fluctuation in mood state since I started seeing him. His mood is always been quite cheerful and pleasant with no indications of manic or hypomanic episodes or depressive episodes to this point.  Diagnosis:    Axis I:  Mood disorder  ADHD (attention deficit hyperactivity disorder), combined type  OCD (obsessive compulsive disorder)      Suri Tafolla R, PsyD 02/28/2015

## 2015-03-29 ENCOUNTER — Other Ambulatory Visit: Payer: Self-pay | Admitting: *Deleted

## 2015-03-29 ENCOUNTER — Telehealth: Payer: Self-pay | Admitting: Family Medicine

## 2015-03-29 MED ORDER — ALBUTEROL SULFATE HFA 108 (90 BASE) MCG/ACT IN AERS: 2.0000 | INHALATION_SPRAY | Freq: Four times a day (QID) | RESPIRATORY_TRACT | Status: DC | PRN

## 2015-03-29 NOTE — Telephone Encounter (Signed)
Pt is needing a refill on his albuterol inhaler.   laynes

## 2015-03-29 NOTE — Telephone Encounter (Signed)
Med sent to pharm.pt notified.  

## 2015-04-08 ENCOUNTER — Ambulatory Visit (HOSPITAL_COMMUNITY): Payer: Medicaid Other | Admitting: Psychology

## 2015-04-21 ENCOUNTER — Encounter (HOSPITAL_COMMUNITY): Payer: Self-pay | Admitting: Psychiatry

## 2015-04-21 ENCOUNTER — Ambulatory Visit (INDEPENDENT_AMBULATORY_CARE_PROVIDER_SITE_OTHER): Payer: Medicaid Other | Admitting: Psychiatry

## 2015-04-21 VITALS — BP 131/83 | HR 95 | Ht 66.0 in | Wt 176.4 lb

## 2015-04-21 DIAGNOSIS — F902 Attention-deficit hyperactivity disorder, combined type: Secondary | ICD-10-CM

## 2015-04-21 DIAGNOSIS — F39 Unspecified mood [affective] disorder: Secondary | ICD-10-CM | POA: Diagnosis not present

## 2015-04-21 MED ORDER — LISDEXAMFETAMINE DIMESYLATE 40 MG PO CAPS: 40.0000 mg | ORAL_CAPSULE | ORAL | Status: DC

## 2015-04-21 NOTE — Progress Notes (Signed)
Patient ID: KEITHON MCCOIN, male   DOB: [birth year], 24 y.o.   MRN: [id number] Patient ID: KEGHAN MCFARREN, male   DOB: [birth year], 24 y.o.   MRN: [id number] Patient ID: ALTO GANDOLFO, male   DOB: 1991/03/04, 24 y.o.   MRN: [id number] Patient ID: MARVELL TAMER, male   DOB: 1991/06/24, 24 y.o.   MRN: [id number] Patient ID: JONAVEN HILGERS, male   DOB: 1990-10-26, 24 y.o.   MRN: [id number] Patient ID: SHADY BRADISH, male   DOB: 05/20/1991, 24 y.o.   MRN: [id number] Patient ID: JAKHAI FANT, male   DOB: Apr 19, 1991, 24 y.o.   MRN: [id number] Patient ID: KIPLING GRASER, male   DOB: Dec 28, 1990, 24 y.o.   MRN: [id number] Patient ID: CADARIUS NEVARES, male   DOB: 03/18/1991, 24 y.o.   MRN: [id number] Patient ID: AADITH RAUDENBUSH, male   DOB: [birth year], 24 y.o.   MRN: [id number] Patient ID: BRAXDON GAPPA, male   DOB: 10/21/1990, 24 y.o.   MRN: [id number] Patient ID: KASHTEN GOWIN, male   DOB: 03-12-1991, 24 y.o.   MRN: [id number] [facility] [id number] Progress Note THAER MIYOSHI MRN: [id number] DOB: December 07, 1990 Age: 24 y.o.  Date: 04/21/2015 Start Time: 2:55 PM End Time: 3:08 PM  Chief Complaint: Chief Complaint  Patient presents with  . ADHD  . Follow-up    Subjective: "I'm doing well  This patient is a 24 year old single white male who has been living with his wife and baby girl in [place] He is trying to reestablish disability   His mother reports that he was always a very hyperactive child wouldn't listen and acted out. He was constantly in trouble in school. He started on Ritalin at an early age but it made him like a zombie and he lost a lot of weight. Over the years she's been on numerous stimulants. At age 74 his father took custody of him. He did not do well with his father and was very oppositional. He was eventually placed in a group home and went through 3 group homes to his teen years.  At age 37 he was arrested for continued contributing to do the  deliquenct of a minor. He was dating a 23 year old girl who ran away from home and was found with him he spent about 40 days in jail but he claims "turn my life around." Shortly after getting out of jail his father got ill with cancer and he spent a lot of time with him and was glad that they could reunite. The patient states he's currently doing well. He's under good control of his behavior. He's not particularly impulsive and is stable to stay focused. His mother states that he can't work full-time because of some onset something that upsets him he becomes very depressed. For now however he sleeping and eating well and seems to be happy with his wife He's had no further legal charges.  The patient returns after 3 months. His second child, a boy, was born in July. He is a full-time dad now. He states for several weeks he forgot to take the Vyvanse and when he took it again it made him very drowsy. He is at the 50 mg dose and he would like to try a lower dose so we can go down to 40 mg. His mood is stable and he denies any depression or anger episodes   History of Present Illness: He has been on Ritalin, Concerta, Focalin.  He has  also been on Depakote, Abilify as well as Strattera. Allergies: Allergies  Allergen Reactions  . Strattera [Atomoxetine Hcl] Other (See Comments)    Became violent on Strattera.  . Bactrim [Sulfamethoxazole-Trimethoprim] Rash  . Sulfonamide Derivatives Rash   Medical History: Past Medical History  Diagnosis Date  . ADHD (attention deficit hyperactivity disorder)   . Unspecified episodic mood disorder   . Acne   . Obsessive-compulsive disorder   . Oppositional defiant disorder    Surgical History: Past Surgical History  Procedure Laterality Date  . Tendon lengthening      leg tendon stretch as child  . Tendon lengthening      age 34  . Tooth extraction     Family History: family history includes Alcohol abuse in his paternal aunt; Bipolar disorder in his  cousin; Drug abuse in his paternal aunt. There is no history of Anxiety disorder, Dementia, Paranoid behavior, Physical abuse, Schizophrenia, Seizures, or Sexual abuse. Reviewed today in the office and nothing has changed.  Suicidal Ideation: No Plan Formed: No Patient has means to carry out plan: No  Homicidal Ideation: No Plan Formed: No Patient has means to carry out plan: No  Review of Systems: Psychiatric: Agitation: No Hallucination: No Depressed Mood: No Insomnia: No Hypersomnia: No Altered Concentration: No Feels Worthless: No Grandiose Ideas: No Belief In Special Powers: No New/Increased Substance Abuse: No Compulsions: No  Neurologic: Headache: No Seizure: No Paresthesias: No    Family History family history includes Alcohol abuse in his paternal aunt; Bipolar disorder in his cousin; Drug abuse in his paternal aunt. There is no history of Anxiety disorder, Dementia, Paranoid behavior, Physical abuse, Schizophrenia, Seizures, or Sexual abuse.  Medications: Outpatient Encounter Prescriptions as of 04/21/2015  Medication Sig  . albuterol (PROVENTIL HFA;VENTOLIN HFA) 108 (90 BASE) MCG/ACT inhaler Inhale 2 puffs into the lungs every 6 (six) hours as needed for wheezing.  . diphenhydrAMINE (BENADRYL) 25 MG tablet Take 75 mg by mouth as needed.   Marland Kitchen ibuprofen (ADVIL,MOTRIN) 800 MG tablet Take 1 tablet (800 mg total) by mouth every 8 (eight) hours as needed for mild pain. (Patient taking differently: Take 600 mg by mouth every 8 (eight) hours as needed for mild pain. )  . lisdexamfetamine (VYVANSE) 40 MG capsule Take 1 capsule (40 mg total) by mouth every morning.  . lisdexamfetamine (VYVANSE) 40 MG capsule Take 1 capsule (40 mg total) by mouth every morning.  . lisdexamfetamine (VYVANSE) 40 MG capsule Take 1 capsule (40 mg total) by mouth every morning.  . [DISCONTINUED] lisdexamfetamine (VYVANSE) 50 MG capsule Take 1 capsule (50 mg total) by mouth daily. (Patient not taking:  Reported on 04/21/2015)  . [DISCONTINUED] lisdexamfetamine (VYVANSE) 50 MG capsule Take 1 capsule (50 mg total) by mouth daily. (Patient not taking: Reported on 04/21/2015)  . [DISCONTINUED] lisdexamfetamine (VYVANSE) 50 MG capsule Take 1 capsule (50 mg total) by mouth daily. (Patient not taking: Reported on 04/21/2015)   No facility-administered encounter medications on file as of 04/21/2015.    Past Psychiatric History/Hospitalization(s): Anxiety: No Bipolar Disorder: Yes Depression: Yes Mania: No Psychosis: No Schizophrenia: No Personality Disorder: No Hospitalization for psychiatric illness: Yes History of Electroconvulsive Shock Therapy: No Prior Suicide Attempts: No  Physical Exam: Constitutional:  BP 131/83 mmHg  Pulse 95  Ht 5\' 6"  (1.676 m)  Wt 176 lb 6.4 oz (80.015 kg)  BMI 28.49 kg/m2  General Appearance alert oriented   Musculoskeletal: Strength & Muscle Tone: within normal limits Gait & Station: normal Patient leans:  N/A  Psychiatric: Speech (describe rate, volume, coherence, spontaneity, and abnormalities if any): Normal in volume rate and tone spontaneous  Thought Process (describe rate, content, abstract reasoning, and computation): Organized, goal-directed, age-appropriate  Associations: Intact  Thoughts: normal  Mental Status: Orientation: oriented to person, place, time/date and situation Mood & Affect: Mood is good, affect bright hyper talkative Attention Span & Concentration:improved  Lab Results:  Results for orders placed or performed during the hospital encounter of 09/25/14 (from the past 8736 hour(s))  CBC   Collection Time: 09/25/14 10:16 PM  Result Value Ref Range   WBC 10.2 4.0 - 10.5 K/uL   RBC 5.34 4.22 - 5.81 MIL/uL   Hemoglobin 15.9 13.0 - 17.0 g/dL   HCT 45.9 39.0 - 52.0 %   MCV 86.0 78.0 - 100.0 fL   MCH 29.8 26.0 - 34.0 pg   MCHC 34.6 30.0 - 36.0 g/dL   RDW 13.7 11.5 - 15.5 %   Platelets 263 150 - 400 K/uL  Basic metabolic panel    Collection Time: 09/25/14 10:16 PM  Result Value Ref Range   Sodium 139 135 - 145 mmol/L   Potassium 3.4 (L) 3.5 - 5.1 mmol/L   Chloride 106 96 - 112 mmol/L   CO2 25 19 - 32 mmol/L   Glucose, Bld 111 (H) 70 - 99 mg/dL   BUN 14 6 - 23 mg/dL   Creatinine, Ser 0.86 0.50 - 1.35 mg/dL   Calcium 9.5 8.4 - 10.5 mg/dL   GFR calc non Af Amer >90 >90 mL/min   GFR calc Af Amer >90 >90 mL/min   Anion gap 8 5 - 15  Troponin I ([facility])   Collection Time: 09/25/14 10:16 PM  Result Value Ref Range   Troponin I <0.03 <0.031 ng/mL  D-dimer, quantitative   Collection Time: 09/25/14 10:27 PM  Result Value Ref Range   D-Dimer, Quant <0.27 0.00 - 0.48 ug/mL-FEU  TSH   Collection Time: 09/25/14 10:44 PM  Result Value Ref Range   TSH 0.887 0.350 - 4.500 uIU/mL  Urine rapid drug screen (hosp performed)   Collection Time: 09/25/14 10:54 PM  Result Value Ref Range   Opiates NONE DETECTED NONE DETECTED   Cocaine NONE DETECTED NONE DETECTED   Benzodiazepines NONE DETECTED NONE DETECTED   Amphetamines POSITIVE (A) NONE DETECTED   Tetrahydrocannabinol NONE DETECTED NONE DETECTED   Barbiturates NONE DETECTED NONE DETECTED   PCP draws routine labs and nothing is emerging as of concern.  Assessment: Axis I: Mood disorder NOS, ADHD combined type    Axis II: Deferred  Axis III: Wears glasses, acne,   Axis IV: Moderate  Axis V: 65  Plan: I took his vitals.  I reviewed CC, tobacco/med/surg Hx, meds effects/ side effects, problem list, therapies and responses as well as current situation/symptoms discussed options. He will continue Vyvanse but decrease the dose to 40 mg every morning ADHD symptoms. He'll return in 3 months See orders and pt instructions for more details.  MEDICATIONS this encounter: Meds ordered this encounter  Medications  . lisdexamfetamine (VYVANSE) 40 MG capsule    Sig: Take 1 capsule (40 mg total) by mouth every morning.    Dispense:  30 capsule    Refill:  0  .  lisdexamfetamine (VYVANSE) 40 MG capsule    Sig: Take 1 capsule (40 mg total) by mouth every morning.    Dispense:  30 capsule    Refill:  0    Fill after 04/21/15  .  lisdexamfetamine (VYVANSE) 40 MG capsule    Sig: Take 1 capsule (40 mg total) by mouth every morning.    Dispense:  30 capsule    Refill:  0    Fill after 05/21/15   Medical Decision Making Problem Points:  Established problem, stable/improving (1), Review of last therapy session (1), Review of psycho-social stressors (1) and Self-limited or minor (1) Data Points:  Review or order clinical lab tests (1) Review of medication regiment & side effects (2)  I certify that outpatient services furnished can reasonably be expected to improve the patient's condition.   Levonne Spiller, MD

## 2015-05-09 ENCOUNTER — Ambulatory Visit (HOSPITAL_COMMUNITY): Payer: Medicaid Other | Admitting: Psychology

## 2015-05-09 ENCOUNTER — Encounter: Payer: Self-pay | Admitting: Family Medicine

## 2015-05-09 ENCOUNTER — Ambulatory Visit (INDEPENDENT_AMBULATORY_CARE_PROVIDER_SITE_OTHER): Payer: Medicaid Other | Admitting: Family Medicine

## 2015-05-09 VITALS — BP 124/80 | Temp 99.2°F | Ht 66.0 in | Wt 182.0 lb

## 2015-05-09 DIAGNOSIS — B349 Viral infection, unspecified: Secondary | ICD-10-CM | POA: Diagnosis not present

## 2015-05-09 DIAGNOSIS — J301 Allergic rhinitis due to pollen: Secondary | ICD-10-CM | POA: Diagnosis not present

## 2015-05-09 MED ORDER — LORATADINE 10 MG PO TABS: 10.0000 mg | ORAL_TABLET | Freq: Every day | ORAL | Status: DC

## 2015-05-09 NOTE — Progress Notes (Signed)
   Subjective:    Patient ID: [name], male    DOB: [birth year], 24 y.o.   MRN: [id number]  Sinusitis This is a new problem. The current episode started in the past 7 days. The problem is unchanged. There has been no fever. The pain is moderate. Associated symptoms include congestion and coughing. Past treatments include oral decongestants. The treatment provided no relief.   Symptoms present for the past several days although better yesterday and today son with similar illness   Review of Systems  HENT: Positive for congestion.   Respiratory: Positive for cough.    some sneezing and runny nose no sore throat no fever no chills no headaches     Objective:   Physical Exam Sinus nontender eardrums normal throat normal neck supple lungs clear       Assessment & Plan:  Viral syndrome No antibiotics indicated allergies OTC measures discussed. Follow-up if progressive troubles or worse.

## 2015-06-08 ENCOUNTER — Ambulatory Visit (INDEPENDENT_AMBULATORY_CARE_PROVIDER_SITE_OTHER): Payer: Medicaid Other | Admitting: Psychology

## 2015-06-08 ENCOUNTER — Encounter (HOSPITAL_COMMUNITY): Payer: Self-pay | Admitting: Psychology

## 2015-06-08 DIAGNOSIS — F39 Unspecified mood [affective] disorder: Secondary | ICD-10-CM

## 2015-06-08 DIAGNOSIS — F902 Attention-deficit hyperactivity disorder, combined type: Secondary | ICD-10-CM | POA: Diagnosis not present

## 2015-06-08 NOTE — Progress Notes (Signed)
      PROGRESS NOTE  Patient:  [name]   DOB: [birth year]  MR Number: [id number]  Location: [facility] [address] [place] [address] Dept: [phone]  Start: 2 PM End: 3 PM  Provider/Observer:     Edgardo Roys PSYD  Chief Complaint:      Chief Complaint  Patient presents with  . Anxiety  . Stress    Reason For Service:     The patient was referred by his treating psychiatrist Dr. Dwyane Dee, who felt that he needed to engage in some psychotherapeutic interventions and building some of his coping skills. The patient has been seen by Dr. Dwyane Dee through [facility] and then started seeing her here through [facility]. He is carried a number of diagnoses including conduct disorder with adolescent onset, attention deficit disorder with hyperactivity, and mood disorder NOS. He has been tried on a number of different medications including Concerta and Abilify as well as Depakote and Adderall. He does appear to be responding well to his current medications but there was a concern or interest in helping build more coping skills to do is reduced intellectual capacity and poor coping skills.  Interventions Strategy:  Cognitive/behavioral psychotherapy  Participation Level:   Active  Participation Quality:  Appropriate      Behavioral Observation:  Well Groomed, Alert, and Appropriate.   Current Psychosocial Factors: The patient reports that he has been doing well and has figured out the issue with his heating and air system as well as the scar. The patient reports that overall this is really helped his self-esteem being able to figure out things around the house and contribute to his family and ways that say they're scarce financial resources.     Content of Session:   Reviewed current symptoms and continue to work on building coping skills and  strategies.  Current Status:   Patient reports that his increased ability lately to do things around the house and help but his family utilizing various coping skills we have develop to have really helped his self-esteem..  Patient Progress:   Stable  Target Goals:   Reduce impulsive and maladaptive behaviors and engage in better use of coping skills.  Last Reviewed:   06/08/2015  Goals Addressed Today:    We continue to work on target goals of reducing the impulsive and manipulative behaviors and better at implementation of coping skills for judgment and future planning.  Impression/Diagnosis:   The patient has been diagnosed with a number of psychiatric conditions including mood disorder, attention deficit disorder, conduct disorder and other variations of these conditions. I do think that the most appropriate diagnosis at this time is one of attention deficit disorder combined type with the continued rule out of a mood disorder. I have not seen the patient in any significant fluctuation in mood state since I started seeing him. His mood is always been quite cheerful and pleasant with no indications of manic or hypomanic episodes or depressive episodes to this point.  Diagnosis:    Axis I:  ADHD (attention deficit hyperactivity disorder), combined type  Mood disorder ([place])      RODENBOUGH,JOHN R, PsyD 06/08/2015

## 2015-06-15 ENCOUNTER — Encounter: Payer: Self-pay | Admitting: Family Medicine

## 2015-06-15 ENCOUNTER — Ambulatory Visit (INDEPENDENT_AMBULATORY_CARE_PROVIDER_SITE_OTHER): Payer: Medicaid Other | Admitting: Family Medicine

## 2015-06-15 VITALS — Temp 99.0°F | Ht 66.0 in | Wt 185.1 lb

## 2015-06-15 DIAGNOSIS — S51832A Puncture wound without foreign body of left forearm, initial encounter: Secondary | ICD-10-CM

## 2015-06-15 DIAGNOSIS — W5501XA Bitten by cat, initial encounter: Secondary | ICD-10-CM

## 2015-06-15 DIAGNOSIS — S61532A Puncture wound without foreign body of left wrist, initial encounter: Secondary | ICD-10-CM | POA: Diagnosis not present

## 2015-06-15 MED ORDER — AMOXICILLIN-POT CLAVULANATE 875-125 MG PO TABS: 1.0000 | ORAL_TABLET | Freq: Two times a day (BID) | ORAL | Status: DC

## 2015-06-15 NOTE — Progress Notes (Signed)
   Subjective:    Patient ID: [name], male    DOB: [birth year], 24 y.o.   MRN: [id number]  Animal Bite  The incident occurred yesterday. The incident occurred at home. There is an injury to the left wrist and left forearm. The pain is moderate. Associated symptoms include light-headedness. There have been no prior injuries to these areas.   Patient states that he was bitten by cat on yestersday. Patient relates to His his. Stays inside. Does not act abnormal.  Review of Systems  Neurological: Positive for light-headedness.  patient denies fever chills sweats     Objective:   Physical Exam Significant redness around the wrist area forearm region as well. hAnd not swollen.  This Is not outdoors. The patient states he was trying to wash the cat with flea bath. That is what provoked the cat to bite. The risk of rabies extremely low.     Assessment & Plan:  The patient was instructed to keep the cat indoors. Observe the cat for the next 10 days. If any abnormal behavior by the cat immediately notify us. Warm compresses 20 minutes at a time several times per day Augmentin 875 twice a day 10 days if not significantly better over the next few days or dramatically worse may need IV therapy

## 2015-07-08 ENCOUNTER — Ambulatory Visit (HOSPITAL_COMMUNITY): Payer: Self-pay | Admitting: Psychology

## 2015-07-19 ENCOUNTER — Encounter: Payer: Self-pay | Admitting: Family Medicine

## 2015-07-19 ENCOUNTER — Ambulatory Visit (INDEPENDENT_AMBULATORY_CARE_PROVIDER_SITE_OTHER): Payer: Medicaid Other | Admitting: Family Medicine

## 2015-07-19 VITALS — Temp 98.6°F | Ht 66.0 in | Wt 185.2 lb

## 2015-07-19 DIAGNOSIS — J019 Acute sinusitis, unspecified: Secondary | ICD-10-CM | POA: Diagnosis not present

## 2015-07-19 DIAGNOSIS — B9689 Other specified bacterial agents as the cause of diseases classified elsewhere: Secondary | ICD-10-CM

## 2015-07-19 DIAGNOSIS — A084 Viral intestinal infection, unspecified: Secondary | ICD-10-CM | POA: Diagnosis not present

## 2015-07-19 MED ORDER — AMOXICILLIN 500 MG PO TABS: 500.0000 mg | ORAL_TABLET | Freq: Three times a day (TID) | ORAL | Status: DC

## 2015-07-19 MED ORDER — DIPHENOXYLATE-ATROPINE 2.5-0.025 MG PO TABS: 1.0000 | ORAL_TABLET | Freq: Four times a day (QID) | ORAL | Status: DC | PRN

## 2015-07-19 NOTE — Progress Notes (Signed)
   Subjective:    Patient ID: [name], male    DOB: 06-13-1991, 24 y.o.   MRN: [id number]  Cough This is a new problem. The current episode started in the past 7 days. Associated symptoms include headaches, nasal congestion and rhinorrhea. Associated symptoms comments: diarrhea.    Patient with head congestion drainage sinus pressure coughing also nausea and diarrhea symptoms over the past several days worse today called to be seen PMH benign  Review of Systems  HENT: Positive for rhinorrhea.   Respiratory: Positive for cough.   Neurological: Positive for headaches.       Objective:   Physical Exam  Mild sinus tenderness eardrums normal throat is normal neck supple lungs clear heart regular abdomen soft      Assessment & Plan:  diarrhea-Lomotil prescribed when necessary usage Rhinosinusitis antibiotics prescribed Viral syndrome wash hands frequently oral rehydration soft diet Follow-up if progressive troubles The patient was seen after hours to prevent an emergency department visit

## 2015-07-22 ENCOUNTER — Ambulatory Visit (INDEPENDENT_AMBULATORY_CARE_PROVIDER_SITE_OTHER): Payer: Medicaid Other | Admitting: Psychology

## 2015-07-22 DIAGNOSIS — F39 Unspecified mood [affective] disorder: Secondary | ICD-10-CM

## 2015-07-22 DIAGNOSIS — F902 Attention-deficit hyperactivity disorder, combined type: Secondary | ICD-10-CM | POA: Diagnosis not present

## 2015-07-25 ENCOUNTER — Encounter (HOSPITAL_COMMUNITY): Payer: Self-pay | Admitting: Psychiatry

## 2015-07-25 ENCOUNTER — Ambulatory Visit (INDEPENDENT_AMBULATORY_CARE_PROVIDER_SITE_OTHER): Payer: Medicaid Other | Admitting: Psychiatry

## 2015-07-25 VITALS — BP 126/78 | HR 92 | Ht 66.0 in | Wt 187.6 lb

## 2015-07-25 DIAGNOSIS — F39 Unspecified mood [affective] disorder: Secondary | ICD-10-CM

## 2015-07-25 DIAGNOSIS — F902 Attention-deficit hyperactivity disorder, combined type: Secondary | ICD-10-CM

## 2015-07-25 MED ORDER — LISDEXAMFETAMINE DIMESYLATE 40 MG PO CAPS: 40.0000 mg | ORAL_CAPSULE | ORAL | Status: DC

## 2015-07-25 NOTE — Progress Notes (Signed)
Patient ID: [name], male   DOB: 1990-09-17, 24 y.o.   MRN: [id number] Patient ID: [name], male   DOB: 22-Sep-1990, 24 y.o.   MRN: [id number] Patient ID: [name], male   DOB: [birth year], 24 y.o.   MRN: [id number] Patient ID: [name], male   DOB: 1991-06-20, 24 y.o.   MRN: [id number] Patient ID: [name], male   DOB: 02/25/1991, 24 y.o.   MRN: [id number] Patient ID: [name], male   DOB: 1991/08/13, 24 y.o.   MRN: [id number] Patient ID: [name], male   DOB: 09-Mar-1991, 24 y.o.   MRN: [id number] Patient ID: [name], male   DOB: 16-Oct-1990, 24 y.o.   MRN: [id number] Patient ID: [name], male   DOB: [birth year], 24 y.o.   MRN: [id number] Patient ID: [name], male   DOB: 1991-06-08, 24 y.o.   MRN: [id number] Patient ID: [name], male   DOB: 29-Nov-1990, 24 y.o.   MRN: [id number] Patient ID: [name], male   DOB: 11/13/1990, 24 y.o.   MRN: [id number] Patient ID: [name], male   DOB: [birth year], 24 y.o.   MRN: [id number] [facility] [id number] Progress Note [name] MRN: [id number] DOB: 07-17-1991 Age: 24 y.o.  Date: 07/25/2015 Start Time: 2:55 PM End Time: 3:08 PM  Chief Complaint: Chief Complaint  Patient presents with  . ADHD  . Follow-up    Subjective: "I'm doing well  This patient is a 24 year old single white male who has been living with his wife and baby girl in [place] He is trying to reestablish disability   His mother reports that he was always a very hyperactive child wouldn't listen and acted out. He was constantly in trouble in school. He started on Ritalin at an early age but it made him like a zombie and he lost a lot of weight. Over the years she's been on numerous stimulants. At age 71 his father took custody of him. He did not do well with his father and was very oppositional. He was eventually placed in a group home and went through 3 group homes to his teen  years.  At age 34 he was arrested for continued contributing to do the deliquency of a minor. He was dating a 24 year old girl who ran away from home and was found with him he spent about 40 days in jail but he claims "turn my life around." Shortly after getting out of jail his father got ill with cancer and he spent a lot of time with him and was glad that they could reunite. The patient states he's currently doing well. He's under good control of his behavior. He's not particularly impulsive and is stable to stay focused. His mother states that he can't work full-time because of some onset something that upsets him he becomes very depressed. For now however he sleeping and eating well and seems to be happy with his wife He's had no further legal charges.  The patient returns after 3 months. His second child, a boy, was born in July. He is a full-time dad now. He is awaiting disability. He lost it when he got married 2 years ago and is hoping he'll be reinstated. He is on Vyvanse 40 mg daily and doing well. His focus is good and he is not having many temper outbursts   History of Present Illness: He has been on Ritalin, Concerta, Focalin.  He has  also been on Depakote, Abilify as well as Strattera. Allergies: Allergies  Allergen Reactions  . Strattera [Atomoxetine Hcl] Other (See Comments)    Became violent on Strattera.  . Bactrim [Sulfamethoxazole-Trimethoprim] Rash  . Sulfonamide Derivatives Rash   Medical History: Past Medical History  Diagnosis Date  . ADHD (attention deficit hyperactivity disorder)   . Unspecified episodic mood disorder   . Acne   . Obsessive-compulsive disorder   . Oppositional defiant disorder    Surgical History: Past Surgical History  Procedure Laterality Date  . Tendon lengthening      leg tendon stretch as child  . Tendon lengthening      age 36  . Tooth extraction     Family History: family history includes Alcohol abuse in his paternal aunt; Bipolar  disorder in his cousin; Drug abuse in his paternal aunt. There is no history of Anxiety disorder, Dementia, Paranoid behavior, Physical abuse, Schizophrenia, Seizures, or Sexual abuse. Reviewed today in the office and nothing has changed.  Suicidal Ideation: No Plan Formed: No Patient has means to carry out plan: No  Homicidal Ideation: No Plan Formed: No Patient has means to carry out plan: No  Review of Systems: Psychiatric: Agitation: No Hallucination: No Depressed Mood: No Insomnia: No Hypersomnia: No Altered Concentration: No Feels Worthless: No Grandiose Ideas: No Belief In Special Powers: No New/Increased Substance Abuse: No Compulsions: No  Neurologic: Headache: No Seizure: No Paresthesias: No    Family History family history includes Alcohol abuse in his paternal aunt; Bipolar disorder in his cousin; Drug abuse in his paternal aunt. There is no history of Anxiety disorder, Dementia, Paranoid behavior, Physical abuse, Schizophrenia, Seizures, or Sexual abuse.  Medications: Outpatient Encounter Prescriptions as of 07/25/2015  Medication Sig  . albuterol (PROVENTIL HFA;VENTOLIN HFA) 108 (90 BASE) MCG/ACT inhaler Inhale 2 puffs into the lungs every 6 (six) hours as needed for wheezing.  . diphenhydrAMINE (BENADRYL) 25 MG tablet Take 75 mg by mouth as needed.   Marland Kitchen ibuprofen (ADVIL,MOTRIN) 800 MG tablet Take 1 tablet (800 mg total) by mouth every 8 (eight) hours as needed for mild pain. (Patient taking differently: Take 600 mg by mouth every 8 (eight) hours as needed for mild pain. )  . lisdexamfetamine (VYVANSE) 40 MG capsule Take 1 capsule (40 mg total) by mouth every morning.  . loratadine (CLARITIN) 10 MG tablet Take 1 tablet (10 mg total) by mouth daily.  . [DISCONTINUED] lisdexamfetamine (VYVANSE) 40 MG capsule Take 1 capsule (40 mg total) by mouth every morning.  . lisdexamfetamine (VYVANSE) 40 MG capsule Take 1 capsule (40 mg total) by mouth every morning.  .  lisdexamfetamine (VYVANSE) 40 MG capsule Take 1 capsule (40 mg total) by mouth every morning.  . [DISCONTINUED] amoxicillin (AMOXIL) 500 MG tablet Take 1 tablet (500 mg total) by mouth 3 (three) times daily. (Patient not taking: Reported on 07/25/2015)  . [DISCONTINUED] diphenoxylate-atropine (LOMOTIL) 2.5-0.025 MG tablet Take 1 tablet by mouth 4 (four) times daily as needed for diarrhea or loose stools. (Patient not taking: Reported on 07/25/2015)   No facility-administered encounter medications on file as of 07/25/2015.    Past Psychiatric History/Hospitalization(s): Anxiety: No Bipolar Disorder: Yes Depression: Yes Mania: No Psychosis: No Schizophrenia: No Personality Disorder: No Hospitalization for psychiatric illness: Yes History of Electroconvulsive Shock Therapy: No Prior Suicide Attempts: No  Physical Exam: Constitutional:  BP 126/78 mmHg  Pulse 92  Ht 5\' 6"  (1.676 m)  Wt 187 lb 9.6 oz (85.095 kg)  BMI 30.29 kg/m2  SpO2 97%  General Appearance alert oriented   Musculoskeletal: Strength & Muscle Tone: within normal limits Gait & Station: normal Patient leans: N/A  Psychiatric: Speech (describe rate, volume, coherence, spontaneity, and abnormalities if any): Normal in volume rate and tone spontaneous  Thought Process (describe rate, content, abstract reasoning, and computation): Organized, goal-directed, age-appropriate  Associations: Intact  Thoughts: normal  Mental Status: Orientation: oriented to person, place, time/date and situation Mood & Affect: Mood is good, affect bright hyper talkative Attention Span & Concentration:improved  Lab Results:  Results for orders placed or performed during the hospital encounter of 09/25/14 (from the past 8736 hour(s))  CBC   Collection Time: 09/25/14 10:16 PM  Result Value Ref Range   WBC 10.2 4.0 - 10.5 K/uL   RBC 5.34 4.22 - 5.81 MIL/uL   Hemoglobin 15.9 13.0 - 17.0 g/dL   HCT 45.9 39.0 - 52.0 %   MCV 86.0 78.0 -  100.0 fL   MCH 29.8 26.0 - 34.0 pg   MCHC 34.6 30.0 - 36.0 g/dL   RDW 13.7 11.5 - 15.5 %   Platelets 263 150 - 400 K/uL  Basic metabolic panel   Collection Time: 09/25/14 10:16 PM  Result Value Ref Range   Sodium 139 135 - 145 mmol/L   Potassium 3.4 (L) 3.5 - 5.1 mmol/L   Chloride 106 96 - 112 mmol/L   CO2 25 19 - 32 mmol/L   Glucose, Bld 111 (H) 70 - 99 mg/dL   BUN 14 6 - 23 mg/dL   Creatinine, Ser 0.86 0.50 - 1.35 mg/dL   Calcium 9.5 8.4 - 10.5 mg/dL   GFR calc non Af Amer >90 >90 mL/min   GFR calc Af Amer >90 >90 mL/min   Anion gap 8 5 - 15  Troponin I (MHP)   Collection Time: 09/25/14 10:16 PM  Result Value Ref Range   Troponin I <0.03 <0.031 ng/mL  D-dimer, quantitative   Collection Time: 09/25/14 10:27 PM  Result Value Ref Range   D-Dimer, Quant <0.27 0.00 - 0.48 ug/mL-FEU  TSH   Collection Time: 09/25/14 10:44 PM  Result Value Ref Range   TSH 0.887 0.350 - 4.500 uIU/mL  Urine rapid drug screen (hosp performed)   Collection Time: 09/25/14 10:54 PM  Result Value Ref Range   Opiates NONE DETECTED NONE DETECTED   Cocaine NONE DETECTED NONE DETECTED   Benzodiazepines NONE DETECTED NONE DETECTED   Amphetamines POSITIVE (A) NONE DETECTED   Tetrahydrocannabinol NONE DETECTED NONE DETECTED   Barbiturates NONE DETECTED NONE DETECTED   PCP draws routine labs and nothing is emerging as of concern.  Assessment: Axis I: Mood disorder NOS, ADHD combined type    Axis II: Deferred  Axis III: Wears glasses, acne,   Axis IV: Moderate  Axis V: 65  Plan: I took his vitals.  I reviewed CC, tobacco/med/surg Hx, meds effects/ side effects, problem list, therapies and responses as well as current situation/symptoms discussed options. He will continue Vyvanse  40 mg every morning ADHD symptoms. He'll return in 3 months See orders and pt instructions for more details.  MEDICATIONS this encounter: Meds ordered this encounter  Medications  . lisdexamfetamine (VYVANSE) 40 MG  capsule    Sig: Take 1 capsule (40 mg total) by mouth every morning.    Dispense:  30 capsule    Refill:  0  . lisdexamfetamine (VYVANSE) 40 MG capsule    Sig: Take 1 capsule (40 mg total) by mouth every morning.  Dispense:  30 capsule    Refill:  0    Fill after 08/25/15  . lisdexamfetamine (VYVANSE) 40 MG capsule    Sig: Take 1 capsule (40 mg total) by mouth every morning.    Dispense:  30 capsule    Refill:  0    Fill after 09/25/15   Medical Decision Making Problem Points:  Established problem, stable/improving (1), Review of last therapy session (1), Review of psycho-social stressors (1) and Self-limited or minor (1) Data Points:  Review or order clinical lab tests (1) Review of medication regiment & side effects (2)  I certify that outpatient services furnished can reasonably be expected to improve the patient's condition.   Levonne Spiller, MD

## 2015-08-16 ENCOUNTER — Ambulatory Visit (INDEPENDENT_AMBULATORY_CARE_PROVIDER_SITE_OTHER): Payer: Medicaid Other | Admitting: Nurse Practitioner

## 2015-08-16 ENCOUNTER — Encounter: Payer: Self-pay | Admitting: Nurse Practitioner

## 2015-08-16 VITALS — BP 122/82 | Temp 99.1°F | Ht 66.0 in | Wt 192.0 lb

## 2015-08-16 DIAGNOSIS — L739 Follicular disorder, unspecified: Secondary | ICD-10-CM

## 2015-08-16 MED ORDER — DOXYCYCLINE HYCLATE 100 MG PO TABS: 100.0000 mg | ORAL_TABLET | Freq: Two times a day (BID) | ORAL | Status: DC

## 2015-08-16 NOTE — Progress Notes (Signed)
Subjective:  Presents for c/o rash mainly on the lower abd and back that began 12/21. Non pruritic. Non tender. No fever. No known contacts. Note: his wife shaves his chest and pubic area. Has a history of severe acne.   Objective:   BP 122/82 mmHg  Temp(Src) 99.1 F (37.3 C) (Oral)  Ht 5\' 6"  (1.676 m)  Wt 192 lb (87.091 kg)  BMI 31.00 kg/m2 NAD. Alert, oriented. Multiple tiny pink papules and pustules noted along the lower abd and low back area especially along the left side.   Assessment: Folliculitis  Plan:  Meds ordered this encounter  Medications  . doxycycline (VIBRA-TABS) 100 MG tablet    Sig: Take 1 tablet (100 mg total) by mouth 2 (two) times daily.    Dispense:  20 tablet    Refill:  2    Order Specific Question:  Supervising Provider    Answer:  Mikey Kirschner [[id number]]   Most likely related to shaving. Clean area with antibacterial soap. Avoid using dull razors. Warning signs reviewed. Call back if worsens or persists.

## 2015-08-23 ENCOUNTER — Ambulatory Visit (INDEPENDENT_AMBULATORY_CARE_PROVIDER_SITE_OTHER): Payer: Medicaid Other | Admitting: Psychology

## 2015-08-23 ENCOUNTER — Encounter (HOSPITAL_COMMUNITY): Payer: Self-pay | Admitting: Psychology

## 2015-08-23 DIAGNOSIS — F39 Unspecified mood [affective] disorder: Secondary | ICD-10-CM | POA: Diagnosis not present

## 2015-08-23 DIAGNOSIS — F902 Attention-deficit hyperactivity disorder, combined type: Secondary | ICD-10-CM | POA: Diagnosis not present

## 2015-08-23 NOTE — Progress Notes (Signed)
PROGRESS NOTE  Patient:  [name]   DOB: Jan 01, 1991  MR Number: [id number]  Location: [facility] [address] [place] [address] Dept: [phone]  Start: 2 PM End: 3 PM  Provider/Observer:     Edgardo Roys PSYD  Chief Complaint:      Chief Complaint  Patient presents with  . Agitation  . Depression  . Anxiety  . Stress    Reason For Service:     The patient was referred by his treating psychiatrist Dr. Dwyane Dee, who felt that he needed to engage in some psychotherapeutic interventions and building some of his coping skills. The patient has been seen by Dr. Dwyane Dee through the [facility] mental health Department and then started seeing her here through [facility]. He is carried a number of diagnoses including conduct disorder with adolescent onset, attention deficit disorder with hyperactivity, and mood disorder NOS. He has been tried on a number of different medications including Concerta and Abilify as well as Depakote and Adderall. He does appear to be responding well to his current medications but there was a concern or interest in helping build more coping skills to do is reduced intellectual capacity and poor coping skills.  Interventions Strategy:  Cognitive/behavioral psychotherapy  Participation Level:   Active  Participation Quality:  Appropriate      Behavioral Observation:  Well Groomed, Alert, and Appropriate.   Current Psychosocial Factors: The patient reports that he has  Been having more anxiety and memory issues lately. The patient reports that he has gained about 50 pounds recently and I asked him about snoring and other issues it may be related to sleep apnea. The patient reports that his wife is complaining about his sleep snoring and it is progressively worsening. The patient reports that he is getting very little physical activity and knows  that his lack of physical activity as a major concern for him.     Content of Session:   Reviewed current symptoms and continue to work on building coping skills and strategies.  Current Status:   Patient reports that  He has had more difficulties recently related to memory issues and feelings fatigued and tired. The patient describes symptoms that sound consistent with what we might expect with sleep apnea and the patient has gained 50 pounds over the past couple of months.  Patient Progress:   Stable  Target Goals:   Reduce impulsive and maladaptive behaviors and engage in better use of coping skills.  Last Reviewed:   06/08/2015  Goals Addressed Today:    We continue to work on target goals of reducing the impulsive and manipulative behaviors and better at implementation of coping skills for judgment and future planning.  Impression/Diagnosis:   The patient has been diagnosed with a number of psychiatric conditions including mood disorder, attention deficit disorder, conduct disorder and other variations of these conditions. I do think that the most appropriate diagnosis at this time is one of attention deficit disorder combined type with the continued rule out of a mood disorder. I have not seen the patient in any significant fluctuation in mood state since I started seeing him. His mood is always been quite cheerful and pleasant with no indications of manic or hypomanic episodes or depressive episodes to this point.  Diagnosis:    Axis I:  ADHD (attention deficit hyperactivity disorder), combined type  Mood disorder ([place])  Edgardo Roys, PsyD 08/23/2015

## 2015-09-20 ENCOUNTER — Ambulatory Visit (INDEPENDENT_AMBULATORY_CARE_PROVIDER_SITE_OTHER): Payer: Medicaid Other | Admitting: Psychology

## 2015-09-20 DIAGNOSIS — F902 Attention-deficit hyperactivity disorder, combined type: Secondary | ICD-10-CM | POA: Diagnosis not present

## 2015-09-20 DIAGNOSIS — F39 Unspecified mood [affective] disorder: Secondary | ICD-10-CM | POA: Diagnosis not present

## 2015-09-22 ENCOUNTER — Ambulatory Visit: Payer: Medicaid Other | Admitting: Family Medicine

## 2015-09-30 ENCOUNTER — Encounter: Payer: Self-pay | Admitting: Family Medicine

## 2015-09-30 ENCOUNTER — Ambulatory Visit (INDEPENDENT_AMBULATORY_CARE_PROVIDER_SITE_OTHER): Payer: Medicaid Other | Admitting: Family Medicine

## 2015-09-30 VITALS — BP 130/80 | Ht 66.0 in | Wt 190.1 lb

## 2015-09-30 DIAGNOSIS — G4719 Other hypersomnia: Secondary | ICD-10-CM

## 2015-09-30 DIAGNOSIS — R5383 Other fatigue: Secondary | ICD-10-CM | POA: Diagnosis not present

## 2015-09-30 NOTE — Progress Notes (Signed)
   Subjective:    Patient ID: [name], male    DOB: [birth year], 25 y.o.   MRN: [id number]  HPI Patient is here today because he is experiencing a lot of fatigue and snoring at nighttime. Patient is concerned about sleep apnea. Patient is concerned about thyroid issues also.  Patient has no other concerns at this time.     patient is concerned because he snores a lot at night and feels tired during the day and sleepy during the day states he feels like he has lack of energy. Denies being depressed.  Review of Systems  patient relates fatigue tiredness denies high fever chills relate snoring denies sweats    Objective:   Physical Exam   lungs clear heart regular neck no masses throat normal abdomen soft no masses  25 minutes was spent with the patient. Greater than half the time was spent in discussion and answering questions and counseling regarding the issues that the patient came in for today.  greater than half the time was spent discussing fatigue possible causes and possible treatments    Assessment & Plan:   significant fatigue tiredness daytime sleepiness we will be checking thyroid CBC as well as liver function metabolic 7 encourage regular exercise healthy eating we'll also be doing a sleep study because of daytime sleepiness and fatigue along with history of snoring

## 2015-10-02 LAB — CBC WITH DIFFERENTIAL/PLATELET
BASOS: 0 %
Basophils Absolute: 0 10*3/uL (ref 0.0–0.2)
EOS (ABSOLUTE): 0.1 10*3/uL (ref 0.0–0.4)
EOS: 2 %
HEMATOCRIT: 44.5 % (ref 37.5–51.0)
HEMOGLOBIN: 14.9 g/dL (ref 12.6–17.7)
Immature Grans (Abs): 0 10*3/uL (ref 0.0–0.1)
Immature Granulocytes: 0 %
LYMPHS ABS: 2.1 10*3/uL (ref 0.7–3.1)
Lymphs: 33 %
MCH: 28.8 pg (ref 26.6–33.0)
MCHC: 33.5 g/dL (ref 31.5–35.7)
MCV: 86 fL (ref 79–97)
MONOCYTES: 7 %
MONOS ABS: 0.5 10*3/uL (ref 0.1–0.9)
NEUTROS ABS: 3.6 10*3/uL (ref 1.4–7.0)
Neutrophils: 58 %
Platelets: 297 10*3/uL (ref 150–379)
RBC: 5.18 x10E6/uL (ref 4.14–5.80)
RDW: 14.9 % (ref 12.3–15.4)
WBC: 6.4 10*3/uL (ref 3.4–10.8)

## 2015-10-02 LAB — BASIC METABOLIC PANEL
BUN/Creatinine Ratio: 19 (ref 8–19)
BUN: 14 mg/dL (ref 6–20)
CHLORIDE: 103 mmol/L (ref 96–106)
CO2: 20 mmol/L (ref 18–29)
Calcium: 9.5 mg/dL (ref 8.7–10.2)
Creatinine, Ser: 0.74 mg/dL — ABNORMAL LOW (ref 0.76–1.27)
GFR, EST AFRICAN AMERICAN: 150 mL/min/{1.73_m2} (ref >59)
GFR, EST NON AFRICAN AMERICAN: 130 mL/min/{1.73_m2} (ref >59)
Glucose: 98 mg/dL (ref 65–99)
POTASSIUM: 4.8 mmol/L (ref 3.5–5.2)
SODIUM: 141 mmol/L (ref 134–144)

## 2015-10-02 LAB — HEPATIC FUNCTION PANEL
ALBUMIN: 4.4 g/dL (ref 3.5–5.5)
ALK PHOS: 76 IU/L (ref 39–117)
ALT: 36 IU/L (ref 0–44)
AST: 22 IU/L (ref 0–40)
Bilirubin Total: <0.2 mg/dL (ref 0.0–1.2)
Bilirubin, Direct: 0.07 mg/dL (ref 0.00–0.40)
Total Protein: 7.5 g/dL (ref 6.0–8.5)

## 2015-10-02 LAB — TSH: TSH: 0.862 u[IU]/mL (ref 0.450–4.500)

## 2015-10-02 LAB — T4, FREE: Free T4: 1.2 ng/dL (ref 0.82–1.77)

## 2015-10-03 ENCOUNTER — Encounter: Payer: Self-pay | Admitting: Family Medicine

## 2015-10-07 ENCOUNTER — Encounter: Payer: Self-pay | Admitting: Family Medicine

## 2015-10-10 ENCOUNTER — Other Ambulatory Visit: Payer: Self-pay | Admitting: Family Medicine

## 2015-10-10 ENCOUNTER — Other Ambulatory Visit (HOSPITAL_COMMUNITY): Payer: Self-pay | Admitting: Respiratory Therapy

## 2015-10-10 DIAGNOSIS — G4719 Other hypersomnia: Secondary | ICD-10-CM

## 2015-10-11 ENCOUNTER — Encounter (HOSPITAL_COMMUNITY): Payer: Self-pay | Admitting: Psychology

## 2015-10-11 NOTE — Progress Notes (Signed)
      PROGRESS NOTE  Patient:  [name]   DOB: [birth year]  MR Number: [id number]  Location: [facility] [address] [place] [address] Dept: [phone]  Start: 2 PM End: 3 PM  Provider/Observer:     Edgardo Roys PSYD  Chief Complaint:      Chief Complaint  Patient presents with  . Agitation  . Stress  . Depression    Reason For Service:     The patient was referred by his treating psychiatrist Dr. Dwyane Dee, who felt that he needed to engage in some psychotherapeutic interventions and building some of his coping skills. The patient has been seen by Dr. Dwyane Dee through the [facility] mental health Department and then started seeing her here through [facility]. He is carried a number of diagnoses including conduct disorder with adolescent onset, attention deficit disorder with hyperactivity, and mood disorder NOS. He has been tried on a number of different medications including Concerta and Abilify as well as Depakote and Adderall. He does appear to be responding well to his current medications but there was a concern or interest in helping build more coping skills to do is reduced intellectual capacity and poor coping skills.  Interventions Strategy:  Cognitive/behavioral psychotherapy  Participation Level:   Active  Participation Quality:  Appropriate      Behavioral Observation:  Well Groomed, Alert, and Appropriate.   Current Psychosocial Factors: The patient reports that he has  Has had more stress related to coping difficulties. A second child is adding stress to his ability to feel like he can manage and cope financially.     Content of Session:   Reviewed current symptoms and continue to work on building coping skills and strategies.  Current Status:   Patient reports that his increased ability lately to do things around the house and help but his family  utilizing various coping skills we have develop to have really helped his self-esteem..  Patient Progress:   Stable  Target Goals:   Reduce impulsive and maladaptive behaviors and engage in better use of coping skills.  Last Reviewed:    07/21/2015  Goals Addressed Today:    We continue to work on target goals of reducing the impulsive and manipulative behaviors and better at implementation of coping skills for judgment and future planning.  Impression/Diagnosis:   The patient has been diagnosed with a number of psychiatric conditions including mood disorder, attention deficit disorder, conduct disorder and other variations of these conditions. I do think that the most appropriate diagnosis at this time is one of attention deficit disorder combined type with the continued rule out of a mood disorder. I have not seen the patient in any significant fluctuation in mood state since I started seeing him. His mood is always been quite cheerful and pleasant with no indications of manic or hypomanic episodes or depressive episodes to this point.  Diagnosis:    Axis I:  ADHD (attention deficit hyperactivity disorder), combined type  Mood disorder ([place])      Sela Falk R, PsyD 10/11/2015

## 2015-10-17 ENCOUNTER — Ambulatory Visit (INDEPENDENT_AMBULATORY_CARE_PROVIDER_SITE_OTHER): Payer: Medicaid Other | Admitting: Psychology

## 2015-10-17 DIAGNOSIS — F429 Obsessive-compulsive disorder, unspecified: Secondary | ICD-10-CM | POA: Diagnosis not present

## 2015-10-17 DIAGNOSIS — F39 Unspecified mood [affective] disorder: Secondary | ICD-10-CM | POA: Diagnosis not present

## 2015-10-17 DIAGNOSIS — F902 Attention-deficit hyperactivity disorder, combined type: Secondary | ICD-10-CM

## 2015-10-20 ENCOUNTER — Ambulatory Visit (INDEPENDENT_AMBULATORY_CARE_PROVIDER_SITE_OTHER): Payer: Medicaid Other | Admitting: Psychiatry

## 2015-10-20 ENCOUNTER — Encounter (HOSPITAL_COMMUNITY): Payer: Self-pay | Admitting: Psychiatry

## 2015-10-20 VITALS — BP 126/81 | HR 72 | Ht 66.0 in | Wt 188.6 lb

## 2015-10-20 DIAGNOSIS — F39 Unspecified mood [affective] disorder: Secondary | ICD-10-CM

## 2015-10-20 DIAGNOSIS — F902 Attention-deficit hyperactivity disorder, combined type: Secondary | ICD-10-CM | POA: Diagnosis not present

## 2015-10-20 MED ORDER — LISDEXAMFETAMINE DIMESYLATE 40 MG PO CAPS: 40.0000 mg | ORAL_CAPSULE | ORAL | Status: DC

## 2015-10-20 NOTE — Progress Notes (Signed)
Patient ID: [name], male   DOB: March 17, 1991, 25 y.o.   MRN: [id number] Patient ID: [name], male   DOB: June 07, 1991, 25 y.o.   MRN: [id number] Patient ID: [name], male   DOB: Jun 01, 1991, 25 y.o.   MRN: [id number] Patient ID: [name], male   DOB: 1991-04-21, 25 y.o.   MRN: [id number] Patient ID: [name], male   DOB: 01-05-1991, 25 y.o.   MRN: [id number] Patient ID: [name], male   DOB: 1990/10/14, 25 y.o.   MRN: [id number] Patient ID: [name], male   DOB: 1991-05-25, 25 y.o.   MRN: [id number] Patient ID: [name], male   DOB: Sep 21, 1990, 25 y.o.   MRN: [id number] Patient ID: [name], male   DOB: 1991-07-13, 25 y.o.   MRN: [id number] Patient ID: [name], male   DOB: 1991/03/19, 25 y.o.   MRN: [id number] Patient ID: [name], male   DOB: [birth year], 25 y.o.   MRN: [id number] Patient ID: [name], male   DOB: [birth year], 25 y.o.   MRN: [id number] Patient ID: [name], male   DOB: 1991/04/04, 25 y.o.   MRN: [id number] Patient ID: [name], male   DOB: [birth year], 25 y.o.   MRN: [id number] [facility] [id number] Progress Note CARLYN MCCOLLUM MRN: [id number] DOB: 01/15/1991 Age: 25 y.o.  Date: 10/20/2015 Start Time: 2:55 PM End Time: 3:08 PM  Chief Complaint: Chief Complaint  Patient presents with  . ADHD  . Follow-up    Subjective: "I'm doing well  This patient is a 25 year old single white male who has been living with his wife and baby girl in [place] He is trying to reestablish disability   His mother reports that he was always a very hyperactive child wouldn't listen and acted out. He was constantly in trouble in school. He started on Ritalin at an early age but it made him like a zombie and he lost a lot of weight. Over the years she's been on numerous stimulants. At age 25 his father took custody of him. He did not do well with his father and was very oppositional. He  was eventually placed in a group home and went through 3 group homes to his teen years.  At age 25 he was arrested for continued contributing to do the deliquency of a minor. He was dating a 24 year old girl who ran away from home and was found with him he spent about 40 days in jail but he claims "turn my life around." Shortly after getting out of jail his father got ill with cancer and he spent a lot of time with him and was glad that they could reunite. The patient states he's currently doing well. He's under good control of his behavior. He's not particularly impulsive and is stable to stay focused. His mother states that he can't work full-time because of some onset something that upsets him he becomes very depressed. For now however he sleeping and eating well and seems to be happy with his wife He's had no further legal charges.  The patient returns after 3 months. He recently saw his family physician complaining of fatigue. He states that he stopped the Vyvanse to see if this was the cause but it really isn't much better without it and he is less focused. All of his labs including thyroid panel CBC and basic metabolic panel were all normal. He is supposed to undergo a  sleep study. In the meantime however he is very unfocused and his wife is complaining so he would like to get back on the Vyvanse   History of Present Illness: He has been on Ritalin, Concerta, Focalin.  He has also been on Depakote, Abilify as well as Strattera. Allergies: Allergies  Allergen Reactions  . Strattera [Atomoxetine Hcl] Other (See Comments)    Became violent on Strattera.  . Bactrim [Sulfamethoxazole-Trimethoprim] Rash  . Sulfonamide Derivatives Rash   Medical History: Past Medical History  Diagnosis Date  . ADHD (attention deficit hyperactivity disorder)   . Unspecified episodic mood disorder   . Acne   . Obsessive-compulsive disorder   . Oppositional defiant disorder    Surgical History: Past Surgical  History  Procedure Laterality Date  . Tendon lengthening      leg tendon stretch as child  . Tendon lengthening      age 3  . Tooth extraction     Family History: family history includes Alcohol abuse in his paternal aunt; Bipolar disorder in his cousin; Drug abuse in his paternal aunt. There is no history of Anxiety disorder, Dementia, Paranoid behavior, Physical abuse, Schizophrenia, Seizures, or Sexual abuse. Reviewed today in the office and nothing has changed.  Suicidal Ideation: No Plan Formed: No Patient has means to carry out plan: No  Homicidal Ideation: No Plan Formed: No Patient has means to carry out plan: No  Review of Systems: Psychiatric: Agitation: No Hallucination: No Depressed Mood: No Insomnia: No Hypersomnia: No Altered Concentration: No Feels Worthless: No Grandiose Ideas: No Belief In Special Powers: No New/Increased Substance Abuse: No Compulsions: No  Neurologic: Headache: No Seizure: No Paresthesias: No    Family History family history includes Alcohol abuse in his paternal aunt; Bipolar disorder in his cousin; Drug abuse in his paternal aunt. There is no history of Anxiety disorder, Dementia, Paranoid behavior, Physical abuse, Schizophrenia, Seizures, or Sexual abuse.  Medications: Outpatient Encounter Prescriptions as of 10/20/2015  Medication Sig  . albuterol (PROVENTIL HFA;VENTOLIN HFA) 108 (90 BASE) MCG/ACT inhaler Inhale 2 puffs into the lungs every 6 (six) hours as needed for wheezing.  . diphenhydrAMINE (BENADRYL) 25 MG tablet Take 75 mg by mouth as needed.   Marland Kitchen ibuprofen (ADVIL,MOTRIN) 600 MG tablet TAKE (1) TABLET EVERY EIGHT HOURS AS NEEDED.  Marland Kitchen ibuprofen (ADVIL,MOTRIN) 800 MG tablet Take 1 tablet (800 mg total) by mouth every 8 (eight) hours as needed for mild pain. (Patient taking differently: Take 600 mg by mouth every 8 (eight) hours as needed for mild pain. )  . lisdexamfetamine (VYVANSE) 40 MG capsule Take 1 capsule (40 mg total) by  mouth every morning.  . loratadine (CLARITIN) 10 MG tablet Take 1 tablet (10 mg total) by mouth daily.  . [DISCONTINUED] lisdexamfetamine (VYVANSE) 40 MG capsule Take 1 capsule (40 mg total) by mouth every morning.  . lisdexamfetamine (VYVANSE) 40 MG capsule Take 1 capsule (40 mg total) by mouth every morning.  . lisdexamfetamine (VYVANSE) 40 MG capsule Take 1 capsule (40 mg total) by mouth every morning.  . [DISCONTINUED] doxycycline (VIBRA-TABS) 100 MG tablet Take 1 tablet (100 mg total) by mouth 2 (two) times daily. (Patient not taking: Reported on 10/20/2015)   No facility-administered encounter medications on file as of 10/20/2015.    Past Psychiatric History/Hospitalization(s): Anxiety: No Bipolar Disorder: Yes Depression: Yes Mania: No Psychosis: No Schizophrenia: No Personality Disorder: No Hospitalization for psychiatric illness: Yes History of Electroconvulsive Shock Therapy: No Prior Suicide Attempts: No  Physical Exam:  Constitutional:  BP 126/81 mmHg  Pulse 72  Ht 5\' 6"  (1.676 m)  Wt 188 lb 9.6 oz (85.548 kg)  BMI 30.46 kg/m2  SpO2 97%  General Appearance alert oriented   Musculoskeletal: Strength & Muscle Tone: within normal limits Gait & Station: normal Patient leans: N/A  Psychiatric: Speech (describe rate, volume, coherence, spontaneity, and abnormalities if any): Normal in volume rate and tone spontaneous  Thought Process (describe rate, content, abstract reasoning, and computation): Organized, goal-directed, age-appropriate  Associations: Intact  Thoughts: normal  Mental Status: Orientation: oriented to person, place, time/date and situation Mood & Affect: Mood is good, affect bright hyper talkative Attention Span & Concentration ok, but subjectively worse without Vyvanse  Lab Results:  Results for orders placed or performed in visit on 09/30/15 (from the past 8736 hour(s))  TSH   Collection Time: 10/01/15  9:52 AM  Result Value Ref Range   TSH  0.862 0.450 - 4.500 uIU/mL  T4, free   Collection Time: 10/01/15  9:52 AM  Result Value Ref Range   Free T4 1.20 0.82 - 1.77 ng/dL  CBC with Differential/Platelet   Collection Time: 10/01/15  9:52 AM  Result Value Ref Range   WBC 6.4 3.4 - 10.8 x10E3/uL   RBC 5.18 4.14 - 5.80 x10E6/uL   Hemoglobin 14.9 12.6 - 17.7 g/dL   Hematocrit 44.5 37.5 - 51.0 %   MCV 86 79 - 97 fL   MCH 28.8 26.6 - 33.0 pg   MCHC 33.5 31.5 - 35.7 g/dL   RDW 14.9 12.3 - 15.4 %   Platelets 297 150 - 379 x10E3/uL   Neutrophils 58 %   Lymphs 33 %   Monocytes 7 %   Eos 2 %   Basos 0 %   Neutrophils Absolute 3.6 1.4 - 7.0 x10E3/uL   Lymphocytes Absolute 2.1 0.7 - 3.1 x10E3/uL   Monocytes Absolute 0.5 0.1 - 0.9 x10E3/uL   EOS (ABSOLUTE) 0.1 0.0 - 0.4 x10E3/uL   Basophils Absolute 0.0 0.0 - 0.2 x10E3/uL   Immature Granulocytes 0 %   Immature Grans (Abs) 0.0 0.0 - 0.1 x10E3/uL  Hepatic function panel   Collection Time: 10/01/15  9:52 AM  Result Value Ref Range   Total Protein 7.5 6.0 - 8.5 g/dL   Albumin 4.4 3.5 - 5.5 g/dL   Bilirubin Total <0.2 0.0 - 1.2 mg/dL   Bilirubin, Direct 0.07 0.00 - 0.40 mg/dL   Alkaline Phosphatase 76 39 - 117 IU/L   AST 22 0 - 40 IU/L   ALT 36 0 - 44 IU/L  Basic metabolic panel   Collection Time: 10/01/15  9:52 AM  Result Value Ref Range   Glucose 98 65 - 99 mg/dL   BUN 14 6 - 20 mg/dL   Creatinine, Ser 0.74 (L) 0.76 - 1.27 mg/dL   GFR calc non Af Amer 130 >59 mL/min/1.73   GFR calc Af Amer 150 >59 mL/min/1.73   BUN/Creatinine Ratio 19 8 - 19   Sodium 141 134 - 144 mmol/L   Potassium 4.8 3.5 - 5.2 mmol/L   Chloride 103 96 - 106 mmol/L   CO2 20 18 - 29 mmol/L   Calcium 9.5 8.7 - 10.2 mg/dL   PCP draws routine labs and nothing is emerging as of concern.  Assessment: Axis I: Mood disorder NOS, ADHD combined type    Axis II: Deferred  Axis III: Wears glasses, acne,   Axis IV: Moderate  Axis V: 65  Plan: I took his vitals.  I reviewed CC, tobacco/med/surg Hx,  meds effects/ side effects, problem list, therapies and responses as well as current situation/symptoms discussed options. He will continue Vyvanse  40 mg every morning ADHD symptoms. He'll return in 3 months See orders and pt instructions for more details.  MEDICATIONS this encounter: Meds ordered this encounter  Medications  . lisdexamfetamine (VYVANSE) 40 MG capsule    Sig: Take 1 capsule (40 mg total) by mouth every morning.    Dispense:  30 capsule    Refill:  0  . lisdexamfetamine (VYVANSE) 40 MG capsule    Sig: Take 1 capsule (40 mg total) by mouth every morning.    Dispense:  30 capsule    Refill:  0    Fill after 11/19/15  . lisdexamfetamine (VYVANSE) 40 MG capsule    Sig: Take 1 capsule (40 mg total) by mouth every morning.    Dispense:  30 capsule    Refill:  0    Fill after 12/19/15   Medical Decision Making Problem Points:  Established problem, stable/improving (1), Review of last therapy session (1), Review of psycho-social stressors (1) and Self-limited or minor (1) Data Points:  Review or order clinical lab tests (1) Review of medication regiment & side effects (2)  I certify that outpatient services furnished can reasonably be expected to improve the patient's condition.   Levonne Spiller, MD

## 2015-10-21 ENCOUNTER — Ambulatory Visit (HOSPITAL_COMMUNITY): Payer: Self-pay | Admitting: Psychiatry

## 2015-11-14 ENCOUNTER — Ambulatory Visit (INDEPENDENT_AMBULATORY_CARE_PROVIDER_SITE_OTHER): Payer: Medicaid Other | Admitting: Psychology

## 2015-11-14 DIAGNOSIS — F902 Attention-deficit hyperactivity disorder, combined type: Secondary | ICD-10-CM

## 2015-11-14 DIAGNOSIS — F39 Unspecified mood [affective] disorder: Secondary | ICD-10-CM | POA: Diagnosis not present

## 2015-11-14 DIAGNOSIS — F429 Obsessive-compulsive disorder, unspecified: Secondary | ICD-10-CM

## 2015-11-17 ENCOUNTER — Encounter (HOSPITAL_COMMUNITY): Payer: Self-pay | Admitting: Psychology

## 2015-11-17 NOTE — Progress Notes (Signed)
PROGRESS NOTE  Patient:  [name]   DOB: Apr 02, 1991  MR Number: [id number]  Location: [facility] [address] [place] [address] Dept: [phone]  Start: 2 PM End: 3 PM  Provider/Observer:     Edgardo Roys PSYD  Chief Complaint:      Chief Complaint  Patient presents with  . Agitation  . Depression  . Anxiety  . Stress    Reason For Service:     The patient was referred by his treating psychiatrist Dr. Dwyane Dee, who felt that he needed to engage in some psychotherapeutic interventions and building some of his coping skills. The patient has been seen by Dr. Dwyane Dee through the [facility] mental health Department and then started seeing her here through [facility]. He is carried a number of diagnoses including conduct disorder with adolescent onset, attention deficit disorder with hyperactivity, and mood disorder NOS. He has been tried on a number of different medications including Concerta and Abilify as well as Depakote and Adderall. He does appear to be responding well to his current medications but there was a concern or interest in helping build more coping skills to do is reduced intellectual capacity and poor coping skills.  Interventions Strategy:  Cognitive/behavioral psychotherapy  Participation Level:   Active  Participation Quality:  Appropriate      Behavioral Observation:  Well Groomed, Alert, and Appropriate.   Current Psychosocial Factors: The patient reports that he has been doing better with regard to his anxiety and stress. He reports that his wife is been able to get a job at [employer] and that he has been the primary caretaker for the kids. He reports that it is difficult times dealing with the stress that he has been doing very well as far as caring for the children. The patient reports that he feels more isolated having to stay at home while his  wife is at work. She is primarily working in the evenings but has to sleep at the beginning of the day.     Content of Session:   Reviewed current symptoms and continue to work on building coping skills and strategies.  Current Status:   Patient reports that he has memory issues have continued. He is scheduled to have a sleep study coming up. The patient reports that he has been able to lose about 20 pounds from his peak and weight.  Patient Progress:   Stable  Target Goals:   Reduce impulsive and maladaptive behaviors and engage in better use of coping skills.  Last Reviewed:   11/14/2015  Goals Addressed Today:    We continue to work on target goals of reducing the impulsive and manipulative behaviors and better at implementation of coping skills for judgment and future planning.  Impression/Diagnosis:   The patient has been diagnosed with a number of psychiatric conditions including mood disorder, attention deficit disorder, conduct disorder and other variations of these conditions. I do think that the most appropriate diagnosis at this time is one of attention deficit disorder combined type with the continued rule out of a mood disorder. I have not seen the patient in any significant fluctuation in mood state since I started seeing him. His mood is always been quite cheerful and pleasant with no indications of manic or hypomanic episodes or depressive episodes to this point.  Diagnosis:    Axis I:  ADHD (attention deficit hyperactivity disorder), combined type  Mood disorder (HCC)  OCD (obsessive compulsive disorder)      Caren Garske R, PsyD 11/17/2015

## 2015-11-22 ENCOUNTER — Ambulatory Visit: Payer: Medicaid Other | Attending: Family Medicine | Admitting: Sleep Medicine

## 2015-11-22 ENCOUNTER — Encounter (INDEPENDENT_AMBULATORY_CARE_PROVIDER_SITE_OTHER): Payer: Self-pay

## 2015-11-22 VITALS — Ht 66.0 in | Wt 188.0 lb

## 2015-11-22 DIAGNOSIS — R5383 Other fatigue: Secondary | ICD-10-CM | POA: Insufficient documentation

## 2015-11-22 DIAGNOSIS — Z79899 Other long term (current) drug therapy: Secondary | ICD-10-CM | POA: Insufficient documentation

## 2015-11-22 DIAGNOSIS — G4733 Obstructive sleep apnea (adult) (pediatric): Secondary | ICD-10-CM

## 2015-11-22 DIAGNOSIS — G4710 Hypersomnia, unspecified: Secondary | ICD-10-CM | POA: Diagnosis present

## 2015-11-22 DIAGNOSIS — R0683 Snoring: Secondary | ICD-10-CM | POA: Insufficient documentation

## 2015-11-22 DIAGNOSIS — G4719 Other hypersomnia: Secondary | ICD-10-CM

## 2015-12-06 ENCOUNTER — Encounter (HOSPITAL_COMMUNITY): Payer: Self-pay | Admitting: Psychology

## 2015-12-06 NOTE — Progress Notes (Signed)
PROGRESS NOTE  Patient:  [name]   DOB: 11-Jun-1991  MR Number: [id number]  Location: [facility] [address] [place] [address] Dept: [phone]  Start: 2 PM End: 3 PM  Provider/Observer:     Edgardo Roys PSYD  Chief Complaint:      Chief Complaint  Patient presents with  . Agitation  . Anxiety  . Depression  . Stress    Reason For Service:     The patient was referred by his treating psychiatrist Dr. Dwyane Dee, who felt that he needed to engage in some psychotherapeutic interventions and building some of his coping skills. The patient has been seen by Dr. Dwyane Dee through the [facility] mental health Department and then started seeing her here through [facility]. He is carried a number of diagnoses including conduct disorder with adolescent onset, attention deficit disorder with hyperactivity, and mood disorder NOS. He has been tried on a number of different medications including Concerta and Abilify as well as Depakote and Adderall. He does appear to be responding well to his current medications but there was a concern or interest in helping build more coping skills to do is reduced intellectual capacity and poor coping skills.  Interventions Strategy:  Cognitive/behavioral psychotherapy  Participation Level:   Active  Participation Quality:  Appropriate      Behavioral Observation:  Well Groomed, Alert, and Appropriate.   Current Psychosocial Factors: The patient reports that he has  Been having more anxiety and memory issues lately. The patient reports that he has gained about 50 pounds recently and I asked him about snoring and other issues it may be related to sleep apnea. The patient reports that his wife is complaining about his sleep snoring and it is progressively worsening. The patient reports that he is getting very little physical activity and knows  that his lack of physical activity as a major concern for him.     Content of Session:   Reviewed current symptoms and continue to work on building coping skills and strategies.  Current Status:   Patient reports that  He has had more difficulties recently related to memory issues and feelings fatigued and tired. The patient describes symptoms that sound consistent with what we might expect with sleep apnea and the patient has gained 50 pounds over the past couple of months.  Patient Progress:   Stable  Target Goals:   Reduce impulsive and maladaptive behaviors and engage in better use of coping skills.  Last Reviewed:    09/20/2015  Goals Addressed Today:    We continue to work on target goals of reducing the impulsive and manipulative behaviors and better at implementation of coping skills for judgment and future planning.  Impression/Diagnosis:   The patient has been diagnosed with a number of psychiatric conditions including mood disorder, attention deficit disorder, conduct disorder and other variations of these conditions. I do think that the most appropriate diagnosis at this time is one of attention deficit disorder combined type with the continued rule out of a mood disorder. I have not seen the patient in any significant fluctuation in mood state since I started seeing him. His mood is always been quite cheerful and pleasant with no indications of manic or hypomanic episodes or depressive episodes to this point.  Diagnosis:    Axis I:  ADHD (attention deficit hyperactivity disorder), combined type  Mood disorder ([place])  Edgardo Roys, PsyD 12/06/2015

## 2015-12-06 NOTE — Sleep Study (Signed)
[place] A. Merlene Laughter, MD     [email or web address]             NOCTURNAL POLYSOMNOGRAPHY   LOCATION: [facility]   Patient Name: Brent, Stokes Date: 11/22/2015 Gender: Male D.O.B: [birth year] Age (years): 24 Referring Provider: Not Available Height (inches): 66 Interpreting Physician: Phillips Odor MD, ABSM Weight (lbs): 188 RPSGT: Rosebud Poles BMI: 30 MRN: [id number] Neck Size: 16.50 CLINICAL INFORMATION Sleep Study Type: NPSG Indication for sleep study: Excessive Daytime Sleepiness Epworth Sleepiness Score: 5 SLEEP STUDY TECHNIQUE As per the AASM Manual for the Scoring of Sleep and Associated Events v2.3 (April 2016) with a hypopnea requiring 4% desaturations. The channels recorded and monitored were frontal, central and occipital EEG, electrooculogram (EOG), submentalis EMG (chin), nasal and oral airflow, thoracic and abdominal wall motion, anterior tibialis EMG, snore microphone, electrocardiogram, and pulse oximetry. MEDICATIONS Patient's medications include: N/A. Medications self-administered by patient during sleep study : No sleep medicine administered.  Current outpatient prescriptions:  .  albuterol (PROVENTIL HFA;VENTOLIN HFA) 108 (90 BASE) MCG/ACT inhaler, Inhale 2 puffs into the lungs every 6 (six) hours as needed for wheezing., Disp: 1 Inhaler, Rfl: 0 .  diphenhydrAMINE (BENADRYL) 25 MG tablet, Take 75 mg by mouth as needed. , Disp: , Rfl:  .  ibuprofen (ADVIL,MOTRIN) 600 MG tablet, TAKE (1) TABLET EVERY EIGHT HOURS AS NEEDED., Disp: 90 tablet, Rfl: 0 .  ibuprofen (ADVIL,MOTRIN) 800 MG tablet, Take 1 tablet (800 mg total) by mouth every 8 (eight) hours as needed for mild pain. (Patient taking differently: Take 600 mg by mouth every 8 (eight) hours as needed for mild pain. ), Disp: 30 tablet, Rfl: 0 .  lisdexamfetamine (VYVANSE) 40 MG capsule, Take 1 capsule (40 mg total) by mouth every morning., Disp: 30 capsule, Rfl: 0 .   lisdexamfetamine (VYVANSE) 40 MG capsule, Take 1 capsule (40 mg total) by mouth every morning., Disp: 30 capsule, Rfl: 0 .  lisdexamfetamine (VYVANSE) 40 MG capsule, Take 1 capsule (40 mg total) by mouth every morning., Disp: 30 capsule, Rfl: 0 .  loratadine (CLARITIN) 10 MG tablet, Take 1 tablet (10 mg total) by mouth daily., Disp: 30 tablet, Rfl: 5  SLEEP ARCHITECTURE The study was initiated at 10:58:20 PM and ended at 5:15:41 AM. Sleep onset time was 38.3 minutes and the sleep efficiency was 87.2%. The total sleep time was 329.1 minutes. Stage REM latency was 86.0 minutes. The patient spent 0.61% of the night in stage N1 sleep, 36.77% in stage N2 sleep, 46.51% in stage N3 and 16.11% in REM. Alpha intrusion was absent. Supine sleep was 24.75%. RESPIRATORY PARAMETERS The overall apnea/hypopnea index (AHI) was 2.2 per hour. There were 5 total apneas, including 5 obstructive, 0 central and 0 mixed apneas. There were 7 hypopneas and 0 RERAs. The AHI during Stage REM sleep was 0.0 per hour. AHI while supine was 8.1 per hour. The mean oxygen saturation was 96.38%. The minimum SpO2 during sleep was 89.00%. Moderate snoring was noted during this study. CARDIAC DATA The 2 lead EKG demonstrated sinus rhythm. The mean heart rate was N/A beats per minute. Other EKG findings include: None. LEG MOVEMENT DATA The total PLMS were 0 with a resulting PLMS index of 0.00. Associated arousal with leg movement index was 0.0.   IMPRESSIONS - No significant obstructive sleep apnea occurred during this study. - No significant central sleep apnea occurred during this study. - The patient snored with Moderate snoring volume.  Delano Metz, MD Diplomate, American Board of  Sleep Medicine.

## 2015-12-07 ENCOUNTER — Telehealth: Payer: Self-pay | Admitting: Family Medicine

## 2015-12-07 NOTE — Telephone Encounter (Signed)
Sleep study results are ready & under "notes" in chart review

## 2015-12-08 ENCOUNTER — Encounter: Payer: Self-pay | Admitting: Family Medicine

## 2015-12-08 DIAGNOSIS — R0683 Snoring: Secondary | ICD-10-CM | POA: Insufficient documentation

## 2015-12-08 NOTE — Telephone Encounter (Signed)
Nurse's-please let the patient know that there is no sleep apnea on the test. Which is good news. No need for CPAP machine.

## 2015-12-09 NOTE — Telephone Encounter (Signed)
Discussed with pt. Pt wanted to come in and discuss trouble sleeping. Pt transferred to front to get next regular appt slot.

## 2015-12-14 ENCOUNTER — Encounter (HOSPITAL_COMMUNITY): Payer: Self-pay | Admitting: Psychology

## 2015-12-14 ENCOUNTER — Ambulatory Visit (INDEPENDENT_AMBULATORY_CARE_PROVIDER_SITE_OTHER): Payer: Medicaid Other | Admitting: Psychology

## 2015-12-14 DIAGNOSIS — F429 Obsessive-compulsive disorder, unspecified: Secondary | ICD-10-CM | POA: Diagnosis not present

## 2015-12-14 DIAGNOSIS — F902 Attention-deficit hyperactivity disorder, combined type: Secondary | ICD-10-CM

## 2015-12-14 DIAGNOSIS — F39 Unspecified mood [affective] disorder: Secondary | ICD-10-CM

## 2015-12-14 NOTE — Progress Notes (Signed)
PROGRESS NOTE  Patient:  [name]   DOB: 1991-07-16  MR Number: [id number]  Location: [place] [facility] [address] [place] [address] Dept: [phone]  Start: 1 PM End: 2 PM  Provider/Observer:     Edgardo Roys PSYD  Chief Complaint:      Chief Complaint  Patient presents with  . Agitation  . Anxiety  . Depression  . Stress  . Trauma    Reason For Service:     The patient was referred by his treating psychiatrist Dr. Dwyane Dee, who felt that he needed to engage in some psychotherapeutic interventions and building some of his coping skills. The patient has been seen by Dr. Dwyane Dee through the [facility] mental health Department and then started seeing her here through [facility]. He is carried a number of diagnoses including conduct disorder with adolescent onset, attention deficit disorder with hyperactivity, and mood disorder NOS. He has been tried on a number of different medications including Concerta and Abilify as well as Depakote and Adderall. He does appear to be responding well to his current medications but there was a concern or interest in helping build more coping skills to do is reduced intellectual capacity and poor coping skills.  Interventions Strategy:  Cognitive/behavioral psychotherapy  Participation Level:   Active  Participation Quality:  Appropriate      Behavioral Observation:  Well Groomed, Alert, and Appropriate.   Current Psychosocial Factors: The patient comes in today distraught over the fact that his wife has told him she does not love him and wants a divorce.  She has moved out and the kids are staying there with the patient and he is taking care of them and the home on his own.  He does not want a divorce.  He reports that this was a surprise to him and that she started being distant only over the past couple weeks.  The patient reports  that she has told him that it is due to the way he is and that she can't handle his issues..     Content of Session:   Reviewed current symptoms and continue to work on building coping skills and strategies.  Current Status:   Patient reports that he has been very upset and depressed.  Reports initial suicidal ideation, but those had stopped and he never developed a plan on an impulse to act.  Reports that he is not having SI as of now.  We have a plan in place of what to do if they were to develop and be at any risk of acting on them.  The patient is very impulsive and wants to fix this right now.  He lacks the capacity to do this effectively and this will be a very trying time for him.  Patient Progress:   Patient very upset with current psychosocial issues.  Target Goals:   Reduce impulsive and maladaptive behaviors and engage in better use of coping skills.  Last Reviewed:   12/14/2015  Goals Addressed Today:    We continue to work on target goals of reducing the impulsive and manipulative behaviors and better at implementation of coping skills for judgment and future planning.  Impression/Diagnosis:   The patient has been diagnosed with a number of psychiatric conditions including mood disorder, attention deficit disorder, conduct disorder and other variations of these conditions. I do think that the most appropriate diagnosis at this time is  one of attention deficit disorder combined type with the continued rule out of a mood disorder. I have not seen the patient in any significant fluctuation in mood state since I started seeing him. His mood is always been quite cheerful and pleasant with no indications of manic or hypomanic episodes or depressive episodes to this point.  Diagnosis:    Axis I:  Mood disorder ([place])  OCD (obsessive compulsive disorder)  ADHD (attention deficit hyperactivity disorder), combined type      RODENBOUGH,JOHN R, PsyD 12/14/2015

## 2015-12-14 NOTE — Progress Notes (Signed)
      PROGRESS NOTE  Patient:  [name]   DOB: Feb 10, 1991  MR Number: [id number]  Location: [place] [facility] [facility] [address] [place] [address] Dept: [phone]  Start: 2 PM End: 3 PM  Provider/Observer:     Edgardo Roys PSYD  Chief Complaint:      Chief Complaint  Patient presents with  . Anxiety  . Agitation  . ADHD    Reason For Service:     The patient was referred by his treating psychiatrist Dr. Dwyane Dee, who felt that he needed to engage in some psychotherapeutic interventions and building some of his coping skills. The patient has been seen by Dr. Dwyane Dee through the [facility] mental health Department and then started seeing her here through [facility]. He is carried a number of diagnoses including conduct disorder with adolescent onset, attention deficit disorder with hyperactivity, and mood disorder NOS. He has been tried on a number of different medications including Concerta and Abilify as well as Depakote and Adderall. He does appear to be responding well to his current medications but there was a concern or interest in helping build more coping skills to do is reduced intellectual capacity and poor coping skills.  Interventions Strategy:  Cognitive/behavioral psychotherapy  Participation Level:   Active  Participation Quality:  Appropriate      Behavioral Observation:  Well Groomed, Alert, and Appropriate.   Current Psychosocial Factors: The patient and his wife come in today and report that things have been going fairly well.  Patient still having issues with being impulsive but is happy and the impulsiveness are towards wanting to do good things.       Content of Session:   Reviewed current symptoms and continue to work on building coping skills and strategies.  Current Status:   Patient continues to report memory issues but has been working on losing weight  and doing more around the house etc.  The patient reports issues of depression develop under stress.  Patient Progress:   Stable  Target Goals:   Reduce impulsive and maladaptive behaviors and engage in better use of coping skills.  Last Reviewed:    10/17/2015  Goals Addressed Today:    We continue to work on target goals of reducing the impulsive and manipulative behaviors and better at implementation of coping skills for judgment and future planning.  Impression/Diagnosis:   The patient has been diagnosed with a number of psychiatric conditions including mood disorder, attention deficit disorder, conduct disorder and other variations of these conditions. I do think that the most appropriate diagnosis at this time is one of attention deficit disorder combined type with the continued rule out of a mood disorder. I have not seen the patient in any significant fluctuation in mood state since I started seeing him. His mood is always been quite cheerful and pleasant with no indications of manic or hypomanic episodes or depressive episodes to this point.  Diagnosis:    Axis I:  Mood disorder ([facility])  OCD (obsessive compulsive disorder)  ADHD (attention deficit hyperactivity disorder), combined type

## 2015-12-15 ENCOUNTER — Ambulatory Visit (HOSPITAL_COMMUNITY): Payer: Self-pay | Admitting: Psychology

## 2015-12-27 ENCOUNTER — Ambulatory Visit: Payer: Medicaid Other | Admitting: Family Medicine

## 2015-12-30 ENCOUNTER — Ambulatory Visit (HOSPITAL_COMMUNITY): Payer: Self-pay | Admitting: Psychology

## 2016-01-02 ENCOUNTER — Ambulatory Visit (INDEPENDENT_AMBULATORY_CARE_PROVIDER_SITE_OTHER): Payer: Medicaid Other | Admitting: Psychology

## 2016-01-02 DIAGNOSIS — F39 Unspecified mood [affective] disorder: Secondary | ICD-10-CM

## 2016-01-02 DIAGNOSIS — F902 Attention-deficit hyperactivity disorder, combined type: Secondary | ICD-10-CM | POA: Diagnosis not present

## 2016-01-02 DIAGNOSIS — F429 Obsessive-compulsive disorder, unspecified: Secondary | ICD-10-CM | POA: Diagnosis not present

## 2016-01-18 ENCOUNTER — Encounter (HOSPITAL_COMMUNITY): Payer: Self-pay | Admitting: Psychology

## 2016-01-18 NOTE — Progress Notes (Signed)
      PROGRESS NOTE  Patient:  [name]   DOB: 09/26/1990  MR Number: [id number]  Location: [place] [place] [facility] [address] [place] [address] Dept: [phone]  Start: 1 PM End: 2 PM  Provider/Observer:     Edgardo Roys PSYD  Chief Complaint:      Chief Complaint  Patient presents with  . Anxiety  . Agitation  . Depression  . Stress    Reason For Service:     The patient was referred by his treating psychiatrist Dr. Dwyane Dee, who felt that he needed to engage in some psychotherapeutic interventions and building some of his coping skills. The patient has been seen by Dr. Dwyane Dee through the [facility] mental health Department and then started seeing her here through [facility]. He is carried a number of diagnoses including conduct disorder with adolescent onset, attention deficit disorder with hyperactivity, and mood disorder NOS. He has been tried on a number of different medications including Concerta and Abilify as well as Depakote and Adderall. He does appear to be responding well to his current medications but there was a concern or interest in helping build more coping skills to do is reduced intellectual capacity and poor coping skills.  Interventions Strategy:  Cognitive/behavioral psychotherapy  Participation Level:   Active  Participation Quality:  Appropriate      Behavioral Observation:  Well Groomed, Alert, and Appropriate.   Current Psychosocial Factors: The patient comes in today reporting that he is taking care of his kids and his mother is now living with him helping out.  He reports that his wife is having very little to do with kids and still not living at home.  He thinks the marriage is over.  He is very upset about this and trying to cope.     Content of Session:   Reviewed current symptoms and continue to work on building coping skills and  strategies.  Current Status:   Patient reports that he has been very upset and depressed.  He reports that he is not having SI now, but just trying to take care of kids and adjust to wife leaving.  Patient Progress:   Patient very upset with current psychosocial issues.  Target Goals:   Reduce impulsive and maladaptive behaviors and engage in better use of coping skills.  Last Reviewed:   01/01/2016  Goals Addressed Today:    We continue to work on target goals of reducing the impulsive and manipulative behaviors and better at implementation of coping skills for judgment and future planning.  Impression/Diagnosis:   The patient has been diagnosed with a number of psychiatric conditions including mood disorder, attention deficit disorder, conduct disorder and other variations of these conditions. I do think that the most appropriate diagnosis at this time is one of attention deficit disorder combined type with the continued rule out of a mood disorder. I have not seen the patient in any significant fluctuation in mood state since I started seeing him. His mood is always been quite cheerful and pleasant with no indications of manic or hypomanic episodes or depressive episodes to this point.  Diagnosis:    Axis I:  Mood disorder ([place])  OCD (obsessive compulsive disorder)  ADHD (attention deficit hyperactivity disorder), combined type      Jeslie Lowe R, PsyD 01/18/2016

## 2016-01-20 ENCOUNTER — Ambulatory Visit (INDEPENDENT_AMBULATORY_CARE_PROVIDER_SITE_OTHER): Payer: Medicaid Other | Admitting: Psychiatry

## 2016-01-20 ENCOUNTER — Encounter (HOSPITAL_COMMUNITY): Payer: Self-pay | Admitting: Psychiatry

## 2016-01-20 VITALS — BP 121/67 | HR 86 | Ht 66.0 in | Wt 185.6 lb

## 2016-01-20 DIAGNOSIS — F39 Unspecified mood [affective] disorder: Secondary | ICD-10-CM | POA: Diagnosis not present

## 2016-01-20 DIAGNOSIS — F902 Attention-deficit hyperactivity disorder, combined type: Secondary | ICD-10-CM | POA: Diagnosis not present

## 2016-01-20 MED ORDER — LISDEXAMFETAMINE DIMESYLATE 40 MG PO CAPS
40.0000 mg | ORAL_CAPSULE | ORAL | Status: DC
Start: 2016-01-20 — End: 2016-02-16

## 2016-01-20 MED ORDER — LISDEXAMFETAMINE DIMESYLATE 40 MG PO CAPS: 40.0000 mg | ORAL_CAPSULE | ORAL | Status: DC

## 2016-01-20 MED ORDER — ESCITALOPRAM OXALATE 20 MG PO TABS: 20.0000 mg | ORAL_TABLET | Freq: Every day | ORAL | Status: DC

## 2016-01-20 NOTE — Progress Notes (Signed)
Patient ID: [name], male   DOB: 03-03-1991, 25 y.o.   MRN: [id number] Patient ID: [name], male   DOB: 05/09/1991, 25 y.o.   MRN: [id number] Patient ID: [name], male   DOB: [birth year], 25 y.o.   MRN: [id number] Patient ID: [name], male   DOB: 01-19-1991, 25 y.o.   MRN: [id number] Patient ID: [name], male   DOB: 1991/05/31, 25 y.o.   MRN: [id number] Patient ID: [name], male   DOB: 1990/11/12, 25 y.o.   MRN: [id number] Patient ID: [name], male   DOB: [birth year], 25 y.o.   MRN: [id number] Patient ID: [name], male   DOB: Jun 06, 1991, 25 y.o.   MRN: [id number] Patient ID: [name], male   DOB: 09/17/1990, 25 y.o.   MRN: [id number] Patient ID: [name], male   DOB: [birth year], 25 y.o.   MRN: [id number] Patient ID: [name], male   DOB: [birth year], 25 y.o.   MRN: [id number] Patient ID: [name], male   DOB: [birth year], 25 y.o.   MRN: [id number] Patient ID: [name], male   DOB: February 10, 1991, 25 y.o.   MRN: [id number] Patient ID: [name], male   DOB: 10-29-1990, 25 y.o.   MRN: [id number] Patient ID: [name], male   DOB: June 18, 1991, 25 y.o.   MRN: [id number] [facility] [id number] Progress Note [name] MRN: [id number] DOB: [birth year] Age: 25 y.o.  Date: 01/20/2016 Start Time: 2:55 PM End Time: 3:08 PM  Chief Complaint: Chief Complaint  Patient presents with  . ADHD  . Follow-up    Subjective: "My wife left me"  This patient is a 83 year old single white male who has been living with his Children in [place] He is trying to reestablish disability   His mother reports that he was always a very hyperactive child wouldn't listen and acted out. He was constantly in trouble in school. He started on Ritalin at an early age but it made him like a zombie and he lost a lot of weight. Over the years she's been on numerous stimulants. At age 68 his father took custody of  him. He did not do well with his father and was very oppositional. He was eventually placed in a group home and went through 3 group homes to his teen years.  At age 24 he was arrested for continued contributing to do the deliquency of a minor. He was dating a 24 year old girl who ran away from home and was found with him he spent about 40 days in jail but he claims "turn my life around." Shortly after getting out of jail his father got ill with cancer and he spent a lot of time with him and was glad that they could reunite. The patient states he's currently doing well. He's under good control of his behavior. He's not particularly impulsive and is stable to stay focused. His mother states that he can't work full-time because of some onset something that upsets him he becomes very depressed. For now however he sleeping and eating well and seems to be happy with his wife He's had no further legal charges.  Patient returns after 3 months. Since I last saw him his wife walked out and left him with the 2 children ages 2 years and 1 year. She staying in a tent in the same [place] as the one he resides in. She is now in a relationship  with his best friend. All this is quite painful for him. They did go to a initial hearing and the judge awarded him temporary custody of the children and he is maintain control of the trailer and the vehicles. He is spending all of his time taking care of the children. He does claim that he is been more depressed and tearful since his wife left and requested we try an antidepressant. He has been on several before and has not tried Lexapro and will initiate this today. He would also like to go back on Vyvanse to help with focus. He denies suicidal ideation   History of Present Illness: He has been on Ritalin, Concerta, Focalin.  He has also been on Depakote, Abilify as well as Strattera. Allergies: Allergies  Allergen Reactions  . Strattera [Atomoxetine Hcl] Other (See  Comments)    Became violent on Strattera.  . Bactrim [Sulfamethoxazole-Trimethoprim] Rash  . Sulfonamide Derivatives Rash   Medical History: Past Medical History  Diagnosis Date  . ADHD (attention deficit hyperactivity disorder)   . Unspecified episodic mood disorder   . Acne   . Obsessive-compulsive disorder   . Oppositional defiant disorder    Surgical History: Past Surgical History  Procedure Laterality Date  . Tendon lengthening      leg tendon stretch as child  . Tendon lengthening      age 32  . Tooth extraction     Family History: family history includes Alcohol abuse in his paternal aunt; Bipolar disorder in his cousin; Drug abuse in his paternal aunt. There is no history of Anxiety disorder, Dementia, Paranoid behavior, Physical abuse, Schizophrenia, Seizures, or Sexual abuse. Reviewed today in the office and nothing has changed.  Suicidal Ideation: No Plan Formed: No Patient has means to carry out plan: No  Homicidal Ideation: No Plan Formed: No Patient has means to carry out plan: No  Review of Systems: Psychiatric: Agitation: No Hallucination: No Depressed Mood: No Insomnia: No Hypersomnia: No Altered Concentration: No Feels Worthless: No Grandiose Ideas: No Belief In Special Powers: No New/Increased Substance Abuse: No Compulsions: No  Neurologic: Headache: No Seizure: No Paresthesias: No    Family History family history includes Alcohol abuse in his paternal aunt; Bipolar disorder in his cousin; Drug abuse in his paternal aunt. There is no history of Anxiety disorder, Dementia, Paranoid behavior, Physical abuse, Schizophrenia, Seizures, or Sexual abuse.  Medications: Outpatient Encounter Prescriptions as of 01/20/2016  Medication Sig  . albuterol (PROVENTIL HFA;VENTOLIN HFA) 108 (90 BASE) MCG/ACT inhaler Inhale 2 puffs into the lungs every 6 (six) hours as needed for wheezing.  Marland Kitchen ibuprofen (ADVIL,MOTRIN) 600 MG tablet TAKE (1) TABLET EVERY EIGHT  HOURS AS NEEDED.  Marland Kitchen escitalopram (LEXAPRO) 20 MG tablet Take 1 tablet (20 mg total) by mouth daily.  Mar[place] Kitchen lisdexamfetamine (VYVANSE) 40 MG capsule Take 1 capsule (40 mg total) by mouth every morning.  . lisdexamfetamine (VYVANSE) 40 MG capsule Take 1 capsule (40 mg total) by mouth every morning.  . lisdexamfetamine (VYVANSE) 40 MG capsule Take 1 capsule (40 mg total) by mouth every morning.  . [DISCONTINUED] diphenhydrAMINE (BENADRYL) 25 MG tablet Take 75 mg by mouth as needed. Reported on 01/20/2016  . [DISCONTINUED] ibuprofen (ADVIL,MOTRIN) 800 MG tablet Take 1 tablet (800 mg total) by mouth every 8 (eight) hours as needed for mild pain. (Patient not taking: Reported on 01/20/2016)  . [DISCONTINUED] lisdexamfetamine (VYVANSE) 40 MG capsule Take 1 capsule (40 mg total) by mouth every morning. (Patient not taking: Reported on  01/20/2016)  . [DISCONTINUED] lisdexamfetamine (VYVANSE) 40 MG capsule Take 1 capsule (40 mg total) by mouth every morning. (Patient not taking: Reported on 01/20/2016)  . [DISCONTINUED] lisdexamfetamine (VYVANSE) 40 MG capsule Take 1 capsule (40 mg total) by mouth every morning. (Patient not taking: Reported on 01/20/2016)  . [DISCONTINUED] loratadine (CLARITIN) 10 MG tablet Take 1 tablet (10 mg total) by mouth daily. (Patient not taking: Reported on 01/20/2016)   No facility-administered encounter medications on file as of 01/20/2016.    Past Psychiatric History/Hospitalization(s): Anxiety: No Bipolar Disorder: Yes Depression: Yes Mania: No Psychosis: No Schizophrenia: No Personality Disorder: No Hospitalization for psychiatric illness: Yes History of Electroconvulsive Shock Therapy: No Prior Suicide Attempts: No  Physical Exam: Constitutional:  BP 121/67 mmHg  Pulse 86  Ht 5\' 6"  (1.676 m)  Wt 185 lb 9.6 oz (84.188 kg)  BMI 29.97 kg/m2  SpO2 96%  General Appearance alert oriented   Musculoskeletal: Strength & Muscle Tone: within normal limits Gait & Station:  normal Patient leans: N/A  Psychiatric: Speech (describe rate, volume, coherence, spontaneity, and abnormalities if any): Normal in volume rate and tone spontaneous  Thought Process (describe rate, content, abstract reasoning, and computation): Organized, goal-directed, age-appropriate  Associations: Intact  Thoughts: normal  Mental Status: Orientation: oriented to person, place, time/date and situation Mood & Affect: Mood is Dysphoric affect is congruent and he is tearful at times when discussing his wife Attention Span & Concentration ok, but subjectively worse without Vyvanse  Lab Results:  Results for orders placed or performed in visit on 09/30/15 (from the past 8736 hour(s))  TSH   Collection Time: 10/01/15  9:52 AM  Result Value Ref Range   TSH 0.862 0.450 - 4.500 uIU/mL  T4, free   Collection Time: 10/01/15  9:52 AM  Result Value Ref Range   Free T4 1.20 0.82 - 1.77 ng/dL  CBC with Differential/Platelet   Collection Time: 10/01/15  9:52 AM  Result Value Ref Range   WBC 6.4 3.4 - 10.8 x10E3/uL   RBC 5.18 4.14 - 5.80 x10E6/uL   Hemoglobin 14.9 12.6 - 17.7 g/dL   Hematocrit 44.5 37.5 - 51.0 %   MCV 86 79 - 97 fL   MCH 28.8 26.6 - 33.0 pg   MCHC 33.5 31.5 - 35.7 g/dL   RDW 14.9 12.3 - 15.4 %   Platelets 297 150 - 379 x10E3/uL   Neutrophils 58 %   Lymphs 33 %   Monocytes 7 %   Eos 2 %   Basos 0 %   Neutrophils Absolute 3.6 1.4 - 7.0 x10E3/uL   Lymphocytes Absolute 2.1 0.7 - 3.1 x10E3/uL   Monocytes Absolute 0.5 0.1 - 0.9 x10E3/uL   EOS (ABSOLUTE) 0.1 0.0 - 0.4 x10E3/uL   Basophils Absolute 0.0 0.0 - 0.2 x10E3/uL   Immature Granulocytes 0 %   Immature Grans (Abs) 0.0 0.0 - 0.1 x10E3/uL  Hepatic function panel   Collection Time: 10/01/15  9:52 AM  Result Value Ref Range   Total Protein 7.5 6.0 - 8.5 g/dL   Albumin 4.4 3.5 - 5.5 g/dL   Bilirubin Total <0.2 0.0 - 1.2 mg/dL   Bilirubin, Direct 0.07 0.00 - 0.40 mg/dL   Alkaline Phosphatase 76 39 - 117 IU/L    AST 22 0 - 40 IU/L   ALT 36 0 - 44 IU/L  Basic metabolic panel   Collection Time: 10/01/15  9:52 AM  Result Value Ref Range   Glucose 98 65 - 99 mg/dL  BUN 14 6 - 20 mg/dL   Creatinine, Ser 0.74 (L) 0.76 - 1.27 mg/dL   GFR calc non Af Amer 130 >59 mL/min/1.73   GFR calc Af Amer 150 >59 mL/min/1.73   BUN/Creatinine Ratio 19 8 - 19   Sodium 141 134 - 144 mmol/L   Potassium 4.8 3.5 - 5.2 mmol/L   Chloride 103 96 - 106 mmol/L   CO2 20 18 - 29 mmol/L   Calcium 9.5 8.7 - 10.2 mg/dL   PCP draws routine labs and nothing is emerging as of concern.  Assessment: Axis I: Mood disorder NOS, ADHD combined type    Axis II: Deferred  Axis III: Wears glasses, acne,   Axis IV: Moderate  Axis V: 65  Plan: I took his vitals.  I reviewed CC, tobacco/med/surg Hx, meds effects/ side effects, problem list, therapies and responses as well as current situation/symptoms discussed options. He will continue Vyvanse  40 mg every morning ADHD symptoms. He was start Lexapro 10 mg daily for one week then advance to 20 mg daily He'll return in 4 weeks See orders and pt instructions for more details.  MEDICATIONS this encounter: Meds ordered this encounter  Medications  . lisdexamfetamine (VYVANSE) 40 MG capsule    Sig: Take 1 capsule (40 mg total) by mouth every morning.    Dispense:  30 capsule    Refill:  0    Fill after 03/20/16  . lisdexamfetamine (VYVANSE) 40 MG capsule    Sig: Take 1 capsule (40 mg total) by mouth every morning.    Dispense:  30 capsule    Refill:  0    Fill after 02/18/16  . lisdexamfetamine (VYVANSE) 40 MG capsule    Sig: Take 1 capsule (40 mg total) by mouth every morning.    Dispense:  30 capsule    Refill:  0  . escitalopram (LEXAPRO) 20 MG tablet    Sig: Take 1 tablet (20 mg total) by mouth daily.    Dispense:  30 tablet    Refill:  2    Take half a tablet daily for one week, then one tablet daily   Medical Decision Making Problem Points:  Established problem,  stable/improving (1), Review of last therapy session (1), Review of psycho-social stressors (1) and Self-limited or minor (1) Data Points:  Review or order clinical lab tests (1) Review of medication regiment & side effects (2)  I certify that outpatient services furnished can reasonably be expected to improve the patient's condition.   Levonne Spiller, MD

## 2016-01-23 ENCOUNTER — Ambulatory Visit (INDEPENDENT_AMBULATORY_CARE_PROVIDER_SITE_OTHER): Payer: Medicaid Other | Admitting: Psychology

## 2016-01-23 DIAGNOSIS — F429 Obsessive-compulsive disorder, unspecified: Secondary | ICD-10-CM | POA: Diagnosis not present

## 2016-01-23 DIAGNOSIS — F902 Attention-deficit hyperactivity disorder, combined type: Secondary | ICD-10-CM

## 2016-01-23 DIAGNOSIS — F39 Unspecified mood [affective] disorder: Secondary | ICD-10-CM | POA: Diagnosis not present

## 2016-02-06 ENCOUNTER — Ambulatory Visit (HOSPITAL_COMMUNITY): Payer: Self-pay | Admitting: Psychology

## 2016-02-16 ENCOUNTER — Encounter (HOSPITAL_COMMUNITY): Payer: Self-pay | Admitting: Psychiatry

## 2016-02-16 ENCOUNTER — Ambulatory Visit (INDEPENDENT_AMBULATORY_CARE_PROVIDER_SITE_OTHER): Payer: Medicaid Other | Admitting: Psychiatry

## 2016-02-16 ENCOUNTER — Ambulatory Visit (HOSPITAL_COMMUNITY): Payer: Self-pay | Admitting: Psychiatry

## 2016-02-16 VITALS — BP 117/79 | HR 109 | Ht 66.0 in | Wt 184.6 lb

## 2016-02-16 DIAGNOSIS — F902 Attention-deficit hyperactivity disorder, combined type: Secondary | ICD-10-CM | POA: Diagnosis not present

## 2016-02-16 MED ORDER — LISDEXAMFETAMINE DIMESYLATE 40 MG PO CAPS: 40.0000 mg | ORAL_CAPSULE | ORAL | Status: DC

## 2016-02-16 MED ORDER — ESCITALOPRAM OXALATE 20 MG PO TABS: 20.0000 mg | ORAL_TABLET | Freq: Every day | ORAL | Status: DC

## 2016-02-16 NOTE — Progress Notes (Signed)
Patient ID: [name], male   DOB: Jun 11, 1991, 25 y.o.   MRN: [id number] Patient ID: DASCHEL LAROCHELLE, male   DOB: Apr 27, 1991, 25 y.o.   MRN: [id number] Patient ID: ALIUS LAURANCE, male   DOB: Dec 26, 1990, 25 y.o.   MRN: [id number] Patient ID: GARI TUBRIDY, male   DOB: [birth year], 25 y.o.   MRN: [id number] Patient ID: BENAIAH MIGNEAULT, male   DOB: 1990/12/28, 25 y.o.   MRN: [id number] Patient ID: CRESENCIO COCHREN, male   DOB: 24-Apr-1991, 25 y.o.   MRN: [id number] Patient ID: AGRIM WIETING, male   DOB: 1990-12-25, 25 y.o.   MRN: [id number] Patient ID: KYDAN POPESCU, male   DOB: 12/20/1990, 25 y.o.   MRN: [id number] Patient ID: ROM ALVARDO, male   DOB: 05-Aug-1991, 25 y.o.   MRN: [id number] Patient ID: MARZELL LEDDON, male   DOB: January 27, 1991, 25 y.o.   MRN: [id number] Patient ID: BASHAR OLEARY, male   DOB: Nov 14, 1990, 25 y.o.   MRN: [id number] Patient ID: BRENNDAN CASHDOLLAR, male   DOB: October 06, 1990, 25 y.o.   MRN: [id number] Patient ID: MORRISSEY LENHART, male   DOB: April 07, 1991, 25 y.o.   MRN: [id number] Patient ID: JETT DIVENS, male   DOB: 03-29-1991, 25 y.o.   MRN: [id number] Patient ID: JASKIRAT DEWAR, male   DOB: 07/15/1991, 25 y.o.   MRN: [id number] Patient ID: TIMOTHY HOSTEN, male   DOB: July 18, 1991, 25 y.o.   MRN: [id number] [place] [id number] Progress Note IBRAHEEM FERRALES MRN: [id number] DOB: 23-Feb-1991 Age: 25 y.o.  Date: 02/16/2016 Start Time: 2:55 PM End Time: 3:08 PM  Chief Complaint: Chief Complaint  Patient presents with  . ADHD  . Depression  . Follow-up    Subjective: "My wife left me"  This patient is a 20 year old single white male who has been living with his Children in [place] He is trying to reestablish disability   His mother reports that he was always a very hyperactive child wouldn't listen and acted out. He was constantly in trouble in school. He started on Ritalin at an early age but it made him like a zombie and he lost a lot  of weight. Over the years she's been on numerous stimulants. At age 33 his father took custody of him. He did not do well with his father and was very oppositional. He was eventually placed in a group home and went through 3 group homes to his teen years.  At age 64 he was arrested for continued contributing to do the deliquency of a minor. He was dating a 24 year old girl who ran away from home and was found with him he spent about 40 days in jail but he claims "turn my life around." Shortly after getting out of jail his father got ill with cancer and he spent a lot of time with him and was glad that they could reunite. The patient states he's currently doing well. He's under good control of his behavior. He's not particularly impulsive and is stable to stay focused. His mother states that he can't work full-time because of some onset something that upsets him he becomes very depressed. For now however he sleeping and eating well and seems to be happy with his wife He's had no further legal charges.  Patient returns after 4 weeks. He told me last time that his wife left him for his best friend. At first she was very upset about it but seems to  come to terms with things. I called in Lexapro for him but he never picked it up because of financial reasons but he is going to try to get it next week. In the meantime his mood is improved and he now has a new girlfriend any staking care of his children full time. He is not sure he needs the Lexapro but he would like to pick it up just in case. He continues on Vyvanse to help with focus. He had a recent sleep study which was normal   History of Present Illness: He has been on Ritalin, Concerta, Focalin.  He has also been on Depakote, Abilify as well as Strattera. Allergies: Allergies  Allergen Reactions  . Strattera [Atomoxetine Hcl] Other (See Comments)    Became violent on Strattera.  . Bactrim [Sulfamethoxazole-Trimethoprim] Rash  . Sulfonamide Derivatives  Rash   Medical History: Past Medical History  Diagnosis Date  . ADHD (attention deficit hyperactivity disorder)   . Unspecified episodic mood disorder   . Acne   . Obsessive-compulsive disorder   . Oppositional defiant disorder    Surgical History: Past Surgical History  Procedure Laterality Date  . Tendon lengthening      leg tendon stretch as child  . Tendon lengthening      age 11  . Tooth extraction     Family History: family history includes Alcohol abuse in his paternal aunt; Bipolar disorder in his cousin; Drug abuse in his paternal aunt. There is no history of Anxiety disorder, Dementia, Paranoid behavior, Physical abuse, Schizophrenia, Seizures, or Sexual abuse. Reviewed today in the office and nothing has changed.  Suicidal Ideation: No Plan Formed: No Patient has means to carry out plan: No  Homicidal Ideation: No Plan Formed: No Patient has means to carry out plan: No  Review of Systems: Psychiatric: Agitation: No Hallucination: No Depressed Mood: No Insomnia: No Hypersomnia: No Altered Concentration: No Feels Worthless: No Grandiose Ideas: No Belief In Special Powers: No New/Increased Substance Abuse: No Compulsions: No  Neurologic: Headache: No Seizure: No Paresthesias: No    Family History family history includes Alcohol abuse in his paternal aunt; Bipolar disorder in his cousin; Drug abuse in his paternal aunt. There is no history of Anxiety disorder, Dementia, Paranoid behavior, Physical abuse, Schizophrenia, Seizures, or Sexual abuse.  Medications: Outpatient Encounter Prescriptions as of 02/16/2016  Medication Sig  . albuterol (PROVENTIL HFA;VENTOLIN HFA) 108 (90 BASE) MCG/ACT inhaler Inhale 2 puffs into the lungs every 6 (six) hours as needed for wheezing.  Marland Kitchen ibuprofen (ADVIL,MOTRIN) 600 MG tablet TAKE (1) TABLET EVERY EIGHT HOURS AS NEEDED.  Marland Kitchen lisdexamfetamine (VYVANSE) 40 MG capsule Take 1 capsule (40 mg total) by mouth every morning.  .  [DISCONTINUED] lisdexamfetamine (VYVANSE) 40 MG capsule Take 1 capsule (40 mg total) by mouth every morning.  . escitalopram (LEXAPRO) 20 MG tablet Take 1 tablet (20 mg total) by mouth daily.  Marland Kitchen lisdexamfetamine (VYVANSE) 40 MG capsule Take 1 capsule (40 mg total) by mouth every morning.  . lisdexamfetamine (VYVANSE) 40 MG capsule Take 1 capsule (40 mg total) by mouth every morning.  . [DISCONTINUED] escitalopram (LEXAPRO) 20 MG tablet Take 1 tablet (20 mg total) by mouth daily. (Patient not taking: Reported on 02/16/2016)  . [DISCONTINUED] lisdexamfetamine (VYVANSE) 40 MG capsule Take 1 capsule (40 mg total) by mouth every morning. (Patient not taking: Reported on 02/16/2016)  . [DISCONTINUED] lisdexamfetamine (VYVANSE) 40 MG capsule Take 1 capsule (40 mg total) by mouth every morning. (Patient not taking: Reported  on 02/16/2016)   No facility-administered encounter medications on file as of 02/16/2016.    Past Psychiatric History/Hospitalization(s): Anxiety: No Bipolar Disorder: Yes Depression: Yes Mania: No Psychosis: No Schizophrenia: No Personality Disorder: No Hospitalization for psychiatric illness: Yes History of Electroconvulsive Shock Therapy: No Prior Suicide Attempts: No  Physical Exam: Constitutional:  BP 117/79 mmHg  Pulse 109  Ht 5\' 6"  (1.676 m)  Wt 184 lb 9.6 oz (83.734 kg)  BMI 29.81 kg/m2  SpO2 96%  General Appearance alert oriented   Musculoskeletal: Strength & Muscle Tone: within normal limits Gait & Station: normal Patient leans: N/A  Psychiatric: Speech (describe rate, volume, coherence, spontaneity, and abnormalities if any): Normal in volume rate and tone spontaneous  Thought Process (describe rate, content, abstract reasoning, and computation): Organized, goal-directed, age-appropriate  Associations: Intact  Thoughts: normal  Mental Status: Orientation: oriented to person, place, time/date and situation Mood & Affect: Mood is Better and affect is  bright today Attention Span & Concentration ok, but subjectively worse without Vyvanse  Lab Results:  Results for orders placed or performed in visit on 09/30/15 (from the past 8736 hour(s))  TSH   Collection Time: 10/01/15  9:52 AM  Result Value Ref Range   TSH 0.862 0.450 - 4.500 uIU/mL  T4, free   Collection Time: 10/01/15  9:52 AM  Result Value Ref Range   Free T4 1.20 0.82 - 1.77 ng/dL  CBC with Differential/Platelet   Collection Time: 10/01/15  9:52 AM  Result Value Ref Range   WBC 6.4 3.4 - 10.8 x10E3/uL   RBC 5.18 4.14 - 5.80 x10E6/uL   Hemoglobin 14.9 12.6 - 17.7 g/dL   Hematocrit 44.5 37.5 - 51.0 %   MCV 86 79 - 97 fL   MCH 28.8 26.6 - 33.0 pg   MCHC 33.5 31.5 - 35.7 g/dL   RDW 14.9 12.3 - 15.4 %   Platelets 297 150 - 379 x10E3/uL   Neutrophils 58 %   Lymphs 33 %   Monocytes 7 %   Eos 2 %   Basos 0 %   Neutrophils Absolute 3.6 1.4 - 7.0 x10E3/uL   Lymphocytes Absolute 2.1 0.7 - 3.1 x10E3/uL   Monocytes Absolute 0.5 0.1 - 0.9 x10E3/uL   EOS (ABSOLUTE) 0.1 0.0 - 0.4 x10E3/uL   Basophils Absolute 0.0 0.0 - 0.2 x10E3/uL   Immature Granulocytes 0 %   Immature Grans (Abs) 0.0 0.0 - 0.1 x10E3/uL  Hepatic function panel   Collection Time: 10/01/15  9:52 AM  Result Value Ref Range   Total Protein 7.5 6.0 - 8.5 g/dL   Albumin 4.4 3.5 - 5.5 g/dL   Bilirubin Total <0.2 0.0 - 1.2 mg/dL   Bilirubin, Direct 0.07 0.00 - 0.40 mg/dL   Alkaline Phosphatase 76 39 - 117 IU/L   AST 22 0 - 40 IU/L   ALT 36 0 - 44 IU/L  Basic metabolic panel   Collection Time: 10/01/15  9:52 AM  Result Value Ref Range   Glucose 98 65 - 99 mg/dL   BUN 14 6 - 20 mg/dL   Creatinine, Ser 0.74 (L) 0.76 - 1.27 mg/dL   GFR calc non Af Amer 130 >59 mL/min/1.73   GFR calc Af Amer 150 >59 mL/min/1.73   BUN/Creatinine Ratio 19 8 - 19   Sodium 141 134 - 144 mmol/L   Potassium 4.8 3.5 - 5.2 mmol/L   Chloride 103 96 - 106 mmol/L   CO2 20 18 - 29 mmol/L   Calcium 9.5  8.7 - 10.2 mg/dL   PCP draws  routine labs and nothing is emerging as of concern.  Assessment: Axis I: Mood disorder NOS, ADHD combined type    Axis II: Deferred  Axis III: Wears glasses, acne,   Axis IV: Moderate  Axis V: 65  Plan: I took his vitals.  I reviewed CC, tobacco/med/surg Hx, meds effects/ side effects, problem list, therapies and responses as well as current situation/symptoms discussed options. He will continue Vyvanse  40 mg every morning ADHD symptoms. He was start Lexapro 10 mg daily for one week then advance to 20 mg dailyBut only if he thinks he needs it He'll return in 3 months See orders and pt instructions for more details.  MEDICATIONS this encounter: Meds ordered this encounter  Medications  . lisdexamfetamine (VYVANSE) 40 MG capsule    Sig: Take 1 capsule (40 mg total) by mouth every morning.    Dispense:  30 capsule    Refill:  0    Fill after 03/15/16  . lisdexamfetamine (VYVANSE) 40 MG capsule    Sig: Take 1 capsule (40 mg total) by mouth every morning.    Dispense:  30 capsule    Refill:  0    Fill after 04/15/16  . lisdexamfetamine (VYVANSE) 40 MG capsule    Sig: Take 1 capsule (40 mg total) by mouth every morning.    Dispense:  30 capsule    Refill:  0  . escitalopram (LEXAPRO) 20 MG tablet    Sig: Take 1 tablet (20 mg total) by mouth daily.    Dispense:  30 tablet    Refill:  2    Take half a tablet daily for one week, then one tablet daily   Medical Decision Making Problem Points:  Established problem, stable/improving (1), Review of last therapy session (1), Review of psycho-social stressors (1) and Self-limited or minor (1) Data Points:  Review or order clinical lab tests (1) Review of medication regiment & side effects (2)  I certify that outpatient services furnished can reasonably be expected to improve the patient's condition.   Levonne Spiller, MD

## 2016-02-27 ENCOUNTER — Ambulatory Visit (HOSPITAL_COMMUNITY): Payer: Self-pay | Admitting: Psychology

## 2016-03-02 ENCOUNTER — Encounter (HOSPITAL_COMMUNITY): Payer: Self-pay | Admitting: Psychology

## 2016-03-02 ENCOUNTER — Ambulatory Visit (INDEPENDENT_AMBULATORY_CARE_PROVIDER_SITE_OTHER): Payer: Medicaid Other | Admitting: Psychology

## 2016-03-02 DIAGNOSIS — F902 Attention-deficit hyperactivity disorder, combined type: Secondary | ICD-10-CM

## 2016-03-02 DIAGNOSIS — F429 Obsessive-compulsive disorder, unspecified: Secondary | ICD-10-CM | POA: Diagnosis not present

## 2016-03-02 DIAGNOSIS — F39 Unspecified mood [affective] disorder: Secondary | ICD-10-CM | POA: Diagnosis not present
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
## 2016-04-02 ENCOUNTER — Ambulatory Visit (INDEPENDENT_AMBULATORY_CARE_PROVIDER_SITE_OTHER): Payer: Medicaid Other | Admitting: Psychology

## 2016-05-02 ENCOUNTER — Encounter (HOSPITAL_COMMUNITY): Payer: Self-pay | Admitting: Psychology

## 2016-05-02 ENCOUNTER — Ambulatory Visit (INDEPENDENT_AMBULATORY_CARE_PROVIDER_SITE_OTHER): Payer: Medicaid Other | Admitting: Psychology

## 2016-05-02 DIAGNOSIS — F429 Obsessive-compulsive disorder, unspecified: Secondary | ICD-10-CM | POA: Diagnosis not present

## 2016-05-02 DIAGNOSIS — F39 Unspecified mood [affective] disorder: Secondary | ICD-10-CM

## 2016-05-02 DIAGNOSIS — F902 Attention-deficit hyperactivity disorder, combined type: Secondary | ICD-10-CM | POA: Diagnosis not present
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
## 2016-05-02 NOTE — Progress Notes (Signed)
      PROGRESS NOTE  Patient:  [name]   DOB: June 01, 1991  MR Number: [id number]  Location: [facility] [address] [place] [address] Dept: [phone]  Start: 1 PM End: 2 PM  Provider/Observer:     Edgardo Roys PSYD  Chief Complaint:      Chief Complaint  Patient presents with  . Anxiety  . Depression  . Stress  . ADHD    Reason For Service:     The patient was referred by his treating psychiatrist Dr. Dwyane Dee, who felt that he needed to engage in some psychotherapeutic interventions and building some of his coping skills. The patient has been seen by Dr. Dwyane Dee through the [facility] mental health Department and then started seeing her here through [facility]. He is carried a number of diagnoses including conduct disorder with adolescent onset, attention deficit disorder with hyperactivity, and mood disorder NOS. He has been tried on a number of different medications including Concerta and Abilify as well as Depakote and Adderall. He does appear to be responding well to his current medications but there was a concern or interest in helping build more coping skills to do is reduced intellectual capacity and poor coping skills.  Interventions Strategy:  Cognitive/behavioral psychotherapy  Participation Level:   Active  Participation Quality:  Appropriate      Behavioral Observation:  Well Groomed, Alert, and Appropriate.   Current Psychosocial Factors: The patient comes in today reporting that he is taking care of his kids and his mother is now living with him helping out.  He reports that his wife is having very little to do with kids and still not living at home.  He thinks the marriage is over.  He is very upset about this and trying to cope.     Content of Session:   Reviewed current symptoms and continue to work on building coping skills and  strategies.  Current Status:   Patient reports that he has been very upset and depressed.  He reports that he is not having SI now, but just trying to take care of kids and adjust to wife leaving.  Patient Progress:   Patient very upset with current psychosocial issues.  Target Goals:   Reduce impulsive and maladaptive behaviors and engage in better use of coping skills.  Last Reviewed:   01/23/2016  Goals Addressed Today:    We continue to work on target goals of reducing the impulsive and manipulative behaviors and better at implementation of coping skills for judgment and future planning.  Impression/Diagnosis:   The patient has been diagnosed with a number of psychiatric conditions including mood disorder, attention deficit disorder, conduct disorder and other variations of these conditions. I do think that the most appropriate diagnosis at this time is one of attention deficit disorder combined type with the continued rule out of a mood disorder. I have not seen the patient in any significant fluctuation in mood state since I started seeing him. His mood is always been quite cheerful and pleasant with no indications of manic or hypomanic episodes or depressive episodes to this point.  Diagnosis:    Axis I:  1. Mood disorder ([place])    2. ADHD (attention deficit hyperactivity disorder), combined type    3. OCD (obsessive compulsive disorder)          Brylinn Teaney R, PsyD 05/02/2016

## 2016-05-03 ENCOUNTER — Ambulatory Visit (HOSPITAL_COMMUNITY): Payer: Self-pay | Admitting: Psychology

## 2016-05-15 ENCOUNTER — Ambulatory Visit (INDEPENDENT_AMBULATORY_CARE_PROVIDER_SITE_OTHER): Payer: Medicaid Other | Admitting: Psychiatry

## 2016-05-15 ENCOUNTER — Encounter (HOSPITAL_COMMUNITY): Payer: Self-pay | Admitting: Psychiatry

## 2016-05-15 VITALS — BP 130/72 | HR 90 | Ht 66.0 in | Wt 191.6 lb

## 2016-05-15 DIAGNOSIS — F902 Attention-deficit hyperactivity disorder, combined type: Secondary | ICD-10-CM

## 2016-05-15 MED ORDER — ESCITALOPRAM OXALATE 20 MG PO TABS: 20.0000 mg | ORAL_TABLET | Freq: Every day | ORAL | 2 refills | Status: DC

## 2016-05-15 MED ORDER — LISDEXAMFETAMINE DIMESYLATE 40 MG PO CAPS: 40.0000 mg | ORAL_CAPSULE | ORAL | 0 refills | Status: DC

## 2016-05-15 NOTE — Progress Notes (Signed)
Patient ID: [name], male   DOB: 13-Mar-1991, 25 y.o.   MRN: [id number] Patient ID: [name], male   DOB: 01/25/1991, 25 y.o.   MRN: [id number] Patient ID: [name], male   DOB: [birth year], 25 y.o.   MRN: [id number] Patient ID: [name], male   DOB: 21-Oct-1990, 25 y.o.   MRN: [id number] Patient ID: [name], male   DOB: 04-Dec-1990, 25 y.o.   MRN: [id number] Patient ID: [name], male   DOB: 1991-01-19, 25 y.o.   MRN: [id number] Patient ID: [name], male   DOB: [birth year], 25 y.o.   MRN: [id number] Patient ID: [name], male   DOB: Jan 26, 1991, 25 y.o.   MRN: [id number] Patient ID: [name], male   DOB: Aug 01, 1991, 25 y.o.   MRN: [id number] Patient ID: [name], male   DOB: August 12, 1991, 25 y.o.   MRN: [id number] Patient ID: [name], male   DOB: 1990/11/12, 25 y.o.   MRN: [id number] Patient ID: [name], male   DOB: Jul 03, 1991, 25 y.o.   MRN: [id number] Patient ID: [name], male   DOB: Mar 23, 1991, 25 y.o.   MRN: [id number] Patient ID: [name], male   DOB: November 14, 1990, 25 y.o.   MRN: [id number] Patient ID: [name], male   DOB: 1991-03-06, 25 y.o.   MRN: [id number] Patient ID: [name], male   DOB: 11-Sep-1990, 25 y.o.   MRN: [id number] [place] [id number] Progress Note [name] MRN: [id number] DOB: 05/01/1991 Age: 25 y.o.  Date: 05/15/2016 Start Time: 2:55 PM End Time: 3:08 PM  Chief Complaint: Chief Complaint  Patient presents with  . Follow-up    per pt he have not had medications due to pharmacy having issues with his insurance.    Subjective: "My wife left me"  This patient is a 25 year old separated white male who has been living with his Children in [place] He is trying to reestablish disability   His mother reports that he was always a very hyperactive child wouldn't listen and acted out. He was constantly in trouble in school. He started on  Ritalin at an early age but it made him like a zombie and he lost a lot of weight. Over the years she's been on numerous stimulants. At age 60 his father took custody of him. He did not do well with his father and was very oppositional. He was eventually placed in a group home and went through 3 group homes to his teen years.  At age 21 he was arrested for continued contributing to do the deliquency of a minor. He was dating a 24 year old girl who ran away from home and was found with him he spent about 40 days in jail but he claims "turn my life around." Shortly after getting out of jail his father got ill with cancer and he spent a lot of time with him and was glad that they could reunite. The patient states he's currently doing well. He's under good control of his behavior. He's not particularly impulsive and is stable to stay focused. His mother states that he can't work full-time because of some onset something that upsets him he becomes very depressed. For now however he sleeping and eating well and seems to be happy with his wife He's had no further legal charges.  Patient returns after 3 months. For now he has primary custody of his children and his wife is  getting weekend visitation. He states that this past week and was her first time visiting her and he had a really difficult time and cried a lot while they were gone. He never got his antidepressant because his insurance is "messed up." He is also off the Vyvanse right now. He has a difficult time focusing but denies suicidal ideation or serious symptoms of depression. He states because of the up-and-down nature of what he is going through he would like to get on the Lexapro if his insurance will cover it  History of Present Illness: He has been on Ritalin, Concerta, Focalin.  He has also been on Depakote, Abilify as well as Strattera. Allergies: Allergies  Allergen Reactions  . Strattera [Atomoxetine Hcl] Other (See Comments)    Became violent  on Strattera.  . Bactrim [Sulfamethoxazole-Trimethoprim] Rash  . Sulfonamide Derivatives Rash   Medical History: Past Medical History:  Diagnosis Date  . Acne   . ADHD (attention deficit hyperactivity disorder)   . Obsessive-compulsive disorder   . Oppositional defiant disorder   . Unspecified episodic mood disorder    Surgical History: Past Surgical History:  Procedure Laterality Date  . TENDON LENGTHENING     leg tendon stretch as child  . TENDON LENGTHENING     age 89  . TOOTH EXTRACTION     Family History: family history includes Alcohol abuse in his paternal aunt; Bipolar disorder in his cousin; Drug abuse in his paternal aunt. Reviewed today in the office and nothing has changed.  Suicidal Ideation: No Plan Formed: No Patient has means to carry out plan: No  Homicidal Ideation: No Plan Formed: No Patient has means to carry out plan: No  Review of Systems: Psychiatric: Agitation: No Hallucination: No Depressed Mood: yes Insomnia: No Hypersomnia: No Altered Concentration: yes Feels Worthless: No Grandiose Ideas: No Belief In Special Powers: No New/Increased Substance Abuse: No Compulsions: No  Neurologic: Headache: No Seizure: No Paresthesias: No    Family History family history includes Alcohol abuse in his paternal aunt; Bipolar disorder in his cousin; Drug abuse in his paternal aunt.  Medications: Outpatient Encounter Prescriptions as of 05/15/2016  Medication Sig Dispense Refill  . albuterol (PROVENTIL HFA;VENTOLIN HFA) 108 (90 BASE) MCG/ACT inhaler Inhale 2 puffs into the lungs every 6 (six) hours as needed for wheezing. 1 Inhaler 0  . ibuprofen (ADVIL,MOTRIN) 600 MG tablet TAKE (1) TABLET EVERY EIGHT HOURS AS NEEDED. 90 tablet 0  . lisdexamfetamine (VYVANSE) 40 MG capsule Take 1 capsule (40 mg total) by mouth every morning. 30 capsule 0  . [DISCONTINUED] lisdexamfetamine (VYVANSE) 40 MG capsule Take 1 capsule (40 mg total) by mouth every morning. 30  capsule 0  . escitalopram (LEXAPRO) 20 MG tablet Take 1 tablet (20 mg total) by mouth daily. 30 tablet 2  . lisdexamfetamine (VYVANSE) 40 MG capsule Take 1 capsule (40 mg total) by mouth every morning. 30 capsule 0  . lisdexamfetamine (VYVANSE) 40 MG capsule Take 1 capsule (40 mg total) by mouth every morning. 30 capsule 0  . [DISCONTINUED] escitalopram (LEXAPRO) 20 MG tablet Take 1 tablet (20 mg total) by mouth daily. (Patient not taking: Reported on 05/15/2016) 30 tablet 2  . [DISCONTINUED] lisdexamfetamine (VYVANSE) 40 MG capsule Take 1 capsule (40 mg total) by mouth every morning. (Patient not taking: Reported on 05/15/2016) 30 capsule 0  . [DISCONTINUED] lisdexamfetamine (VYVANSE) 40 MG capsule Take 1 capsule (40 mg total) by mouth every morning. (Patient not taking: Reported on 05/15/2016) 30 capsule 0  No facility-administered encounter medications on file as of 05/15/2016.     Past Psychiatric History/Hospitalization(s): Anxiety: No Bipolar Disorder: Yes Depression: Yes Mania: No Psychosis: No Schizophrenia: No Personality Disorder: No Hospitalization for psychiatric illness: Yes History of Electroconvulsive Shock Therapy: No Prior Suicide Attempts: No  Physical Exam: Constitutional:  BP 130/72 (BP Location: Right Arm, Patient Position: Sitting, Cuff Size: Large)   Pulse 90   Ht 5\' 6"  (1.676 m)   Wt 191 lb 9.6 oz (86.9 kg)   SpO2 97%   BMI 30.93 kg/m   General Appearance alert oriented   Musculoskeletal: Strength & Muscle Tone: within normal limits Gait & Station: normal Patient leans: N/A  Psychiatric: Speech (describe rate, volume, coherence, spontaneity, and abnormalities if any): Normal in volume rate and tone spontaneous  Thought Process (describe rate, content, abstract reasoning, and computation): Organized, goal-directed, age-appropriate  Associations: Intact  Thoughts: normal  Mental Status: Orientation: oriented to person, place, time/date and  situation Mood & Affect: Mood is Up and down and he is particularly vulnerable to depression when his children are gone Attention Span & Concentration ok, but subjectively worse without Vyvanse  Lab Results:  Results for orders placed or performed in visit on 09/30/15 (from the past 8736 hour(s))  TSH   Collection Time: 10/01/15  9:52 AM  Result Value Ref Range   TSH 0.862 0.450 - 4.500 uIU/mL  T4, free   Collection Time: 10/01/15  9:52 AM  Result Value Ref Range   Free T4 1.20 0.82 - 1.77 ng/dL  CBC with Differential/Platelet   Collection Time: 10/01/15  9:52 AM  Result Value Ref Range   WBC 6.4 3.4 - 10.8 x10E3/uL   RBC 5.18 4.14 - 5.80 x10E6/uL   Hemoglobin 14.9 12.6 - 17.7 g/dL   Hematocrit 44.5 37.5 - 51.0 %   MCV 86 79 - 97 fL   MCH 28.8 26.6 - 33.0 pg   MCHC 33.5 31.5 - 35.7 g/dL   RDW 14.9 12.3 - 15.4 %   Platelets 297 150 - 379 x10E3/uL   Neutrophils 58 %   Lymphs 33 %   Monocytes 7 %   Eos 2 %   Basos 0 %   Neutrophils Absolute 3.6 1.4 - 7.0 x10E3/uL   Lymphocytes Absolute 2.1 0.7 - 3.1 x10E3/uL   Monocytes Absolute 0.5 0.1 - 0.9 x10E3/uL   EOS (ABSOLUTE) 0.1 0.0 - 0.4 x10E3/uL   Basophils Absolute 0.0 0.0 - 0.2 x10E3/uL   Immature Granulocytes 0 %   Immature Grans (Abs) 0.0 0.0 - 0.1 x10E3/uL  Hepatic function panel   Collection Time: 10/01/15  9:52 AM  Result Value Ref Range   Total Protein 7.5 6.0 - 8.5 g/dL   Albumin 4.4 3.5 - 5.5 g/dL   Bilirubin Total <0.2 0.0 - 1.2 mg/dL   Bilirubin, Direct 0.07 0.00 - 0.40 mg/dL   Alkaline Phosphatase 76 39 - 117 IU/L   AST 22 0 - 40 IU/L   ALT 36 0 - 44 IU/L  Basic metabolic panel   Collection Time: 10/01/15  9:52 AM  Result Value Ref Range   Glucose 98 65 - 99 mg/dL   BUN 14 6 - 20 mg/dL   Creatinine, Ser 0.74 (L) 0.76 - 1.27 mg/dL   GFR calc non Af Amer 130 >59 mL/min/1.73   GFR calc Af Amer 150 >59 mL/min/1.73   BUN/Creatinine Ratio 19 8 - 19   Sodium 141 134 - 144 mmol/L   Potassium 4.8 3.5 - 5.2  mmol/L    Chloride 103 96 - 106 mmol/L   CO2 20 18 - 29 mmol/L   Calcium 9.5 8.7 - 10.2 mg/dL   PCP draws routine labs and nothing is emerging as of concern.  Assessment: Axis I: Mood disorder NOS, ADHD combined type    Axis II: Deferred  Axis III: Wears glasses, acne,   Axis IV: Moderate  Axis V: 65  Plan: I took his vitals.  I reviewed CC, tobacco/med/surg Hx, meds effects/ side effects, problem list, therapies and responses as well as current situation/symptoms discussed options. He will continue Vyvanse  40 mg every morning ADHD symptoms. He was start Lexapro 10 mg daily for one week then advance to 20 mg daily He'll return in 3 months See orders and pt instructions for more details.  MEDICATIONS this encounter: Meds ordered this encounter  Medications  . escitalopram (LEXAPRO) 20 MG tablet    Sig: Take 1 tablet (20 mg total) by mouth daily.    Dispense:  30 tablet    Refill:  2    Take half a tablet daily for one week, then one tablet daily  . lisdexamfetamine (VYVANSE) 40 MG capsule    Sig: Take 1 capsule (40 mg total) by mouth every morning.    Dispense:  30 capsule    Refill:  0    Fill after 06/14/16  . lisdexamfetamine (VYVANSE) 40 MG capsule    Sig: Take 1 capsule (40 mg total) by mouth every morning.    Dispense:  30 capsule    Refill:  0    Fill after 07/15/16  . lisdexamfetamine (VYVANSE) 40 MG capsule    Sig: Take 1 capsule (40 mg total) by mouth every morning.    Dispense:  30 capsule    Refill:  0   Medical Decision Making Problem Points:  Established problem, stable/improving (1), Review of last therapy session (1), Review of psycho-social stressors (1) and Self-limited or minor (1) Data Points:  Review or order clinical lab tests (1) Review of medication regiment & side effects (2)  I certify that outpatient services furnished can reasonably be expected to improve the patient's condition.   Levonne Spiller, MD

## 2016-05-18 ENCOUNTER — Telehealth: Payer: Self-pay | Admitting: Family Medicine

## 2016-05-18 NOTE — Telephone Encounter (Signed)
ERROR

## 2016-05-28 ENCOUNTER — Ambulatory Visit (HOSPITAL_COMMUNITY): Payer: Self-pay | Admitting: Psychology

## 2016-06-05 ENCOUNTER — Ambulatory Visit (INDEPENDENT_AMBULATORY_CARE_PROVIDER_SITE_OTHER): Payer: Medicaid Other | Admitting: Psychology

## 2016-06-05 ENCOUNTER — Encounter (HOSPITAL_COMMUNITY): Payer: Self-pay | Admitting: Psychology

## 2016-06-05 DIAGNOSIS — F429 Obsessive-compulsive disorder, unspecified: Secondary | ICD-10-CM

## 2016-06-05 DIAGNOSIS — F39 Unspecified mood [affective] disorder: Secondary | ICD-10-CM

## 2016-06-05 NOTE — Progress Notes (Signed)
PROGRESS NOTE  Patient:  [name]   DOB: May 08, 1991  MR Number: [id number]  Location: [facility] [address] [place] [address] Dept: [phone]  Start: 10 AM End: 11 AM  Provider/Observer:     Edgardo Roys PSYD  Chief Complaint:      Chief Complaint  Patient presents with  . Anxiety  . Depression  . Stress    Reason For Service:     The patient was referred by his treating psychiatrist Dr. Dwyane Dee, who felt that he needed to engage in some psychotherapeutic interventions and building some of his coping skills. The patient has been seen by Dr. Dwyane Dee through the [facility] mental health Department and then started seeing her here through [facility]. He is carried a number of diagnoses including conduct disorder with adolescent onset, attention deficit disorder with hyperactivity, and mood disorder NOS. He has been tried on a number of different medications including Concerta and Abilify as well as Depakote and Adderall. He does appear to be responding well to his current medications but there was a concern or interest in helping build more coping skills to do is reduced intellectual capacity and poor coping skills.  Interventions Strategy:  Cognitive/behavioral psychotherapy  Participation Level:   Active  Participation Quality:  Appropriate      Behavioral Observation:  Well Groomed, Alert, and Appropriate.   Current Psychosocial Factors: The patient Reports that he has had more worry with son.  His son got another infection and suffered a Febrile seizure and was taken to the emergency department. The patient reports that he was very upset when his estranged wife did not show up to the hospital again this time. The patient reports that his relationship with his girlfriend has purposefully been going very slow and they have not been intimate as the patient does  not want to get in the situation with a bad fit again. The patient reports that he does continue to get frustrated with the things that his ex-wife has been doing with regard to her lack of involvement with children. The patient has found a new place to live for he and his kids.   Content of Session:   Reviewed current symptoms and continue to work on building coping skills and strategies.  Current Status:   Patient reports that he has been very upset and depressed.  He reports that he is not having SI now, but just trying to take care of kids and adjust to wife leaving.  Patient Progress:   Patient very upset with current psychosocial issues.  Target Goals:   Reduce impulsive and maladaptive behaviors and engage in better use of coping skills.  Last Reviewed:   06/05/2016  Goals Addressed Today:    We continue to work on target goals of reducing the impulsive and manipulative behaviors and better at implementation of coping skills for judgment and future planning.  Impression/Diagnosis:   The patient has been diagnosed with a number of psychiatric conditions including mood disorder, attention deficit disorder, conduct disorder and other variations of these conditions. I do think that the most appropriate diagnosis at this time is one of attention deficit disorder combined type with the continued rule out of a mood disorder. I have not seen the patient in any significant fluctuation in mood state since I started seeing him. His mood is always been quite cheerful and pleasant with no indications of manic  or hypomanic episodes or depressive episodes to this point.  Diagnosis:    Axis I:  1. Mood disorder ([place])    2. Obsessive-compulsive disorder, unspecified type          RODENBOUGH,JOHN R, PsyD 06/05/2016

## 2016-06-26 ENCOUNTER — Encounter (HOSPITAL_COMMUNITY): Payer: Self-pay | Admitting: Psychology

## 2016-06-26 ENCOUNTER — Ambulatory Visit (INDEPENDENT_AMBULATORY_CARE_PROVIDER_SITE_OTHER): Payer: Medicaid Other | Admitting: Psychology

## 2016-06-26 DIAGNOSIS — F39 Unspecified mood [affective] disorder: Secondary | ICD-10-CM

## 2016-06-26 DIAGNOSIS — F429 Obsessive-compulsive disorder, unspecified: Secondary | ICD-10-CM

## 2016-06-26 NOTE — Progress Notes (Signed)
PROGRESS NOTE  Patient:  [name]   DOB: [birth year]  MR Number: [id number]  Location: [facility] [address] [place] [address] Dept: [phone]  Start: 10 AM End: 11 AM  Provider/Observer:     Edgardo Roys PSYD  Chief Complaint:      Chief Complaint  Patient presents with  . Anxiety  . Depression  . Stress  . Agitation    Reason For Service:     The patient was referred by his treating psychiatrist Dr. Dwyane Dee, who felt that he needed to engage in some psychotherapeutic interventions and building some of his coping skills. The patient has been seen by Dr. Dwyane Dee through the [facility] mental health Department and then started seeing her here through [facility]. He is carried a number of diagnoses including conduct disorder with adolescent onset, attention deficit disorder with hyperactivity, and mood disorder NOS. He has been tried on a number of different medications including Concerta and Abilify as well as Depakote and Adderall. He does appear to be responding well to his current medications but there was a concern or interest in helping build more coping skills to do is reduced intellectual capacity and poor coping skills.  Interventions Strategy:  Cognitive/behavioral psychotherapy  Participation Level:   Active  Participation Quality:  Appropriate      Behavioral Observation:  Well Groomed, Alert, and Appropriate.   Current Psychosocial Factors: The patient Reports that they're continuing challenges being the almost all caregiver for his children. While he is doing an increasingly good job with his kids he is only able to do this with significant assistance from his family in particular his mother. The children's mother is having little interaction with the kids as far as their welfare and when her son had a couple of febrile seizures the patient was the  only one to be able to go with the child to the emergency department and the children's mother essentially said she was too busy. The patient has tried to do things as far as work but has not been able to maintain stability in his life enough to be one have a job or any regular,.  Content of Session:   Reviewed current symptoms and continue to work on building coping skills and strategies.  Current Status:   Patient reports that he has been very upset and depressed.  He reports that he is not having SI now, but just trying to take care of kids and adjust to wife leaving.  Patient Progress:   Patient very upset with current psychosocial issues.  Target Goals:   Reduce impulsive and maladaptive behaviors and engage in better use of coping skills.  Last Reviewed:   06/26/2016  Goals Addressed Today:    We continue to work on target goals of reducing the impulsive and manipulative behaviors and better at implementation of coping skills for judgment and future planning.  Impression/Diagnosis:   The patient has been diagnosed with a number of psychiatric conditions including mood disorder, attention deficit disorder, conduct disorder and other variations of these conditions.   As we got more information into his adulthood I do think that the primary diagnosis is one of a mood disorder. While he does have some significant attentional issues of impulsivity anxiety is becoming increasingly an issue and he has had frequent episodes of depression. The patient is very poor coping skills. He is trying to work on  couple of occasions and they have all turned up very poorly. He was married for some time and has 2 children but the marriage essentially disintegrated because of his poor coping skills in combination with his wife's very poor coping skills. He has not been able to adapt to working situations. The patient has been quite unable to maintain a stable life and without significant assistance from his mother and  other family members he was not been able to cope. His wife is essentially left him taking care of his 2 young children is the primary caregiver.  Diagnosis:    Axis I:  1. Mood disorder ([place])    2. Obsessive-compulsive disorder, unspecified type          Macarena Langseth R, PsyD 06/26/2016

## 2016-07-17 ENCOUNTER — Ambulatory Visit: Payer: Medicaid Other | Admitting: Family Medicine

## 2016-07-18 ENCOUNTER — Encounter: Payer: Self-pay | Admitting: Family Medicine

## 2016-07-18 ENCOUNTER — Ambulatory Visit (INDEPENDENT_AMBULATORY_CARE_PROVIDER_SITE_OTHER): Payer: Medicaid Other | Admitting: Family Medicine

## 2016-07-18 VITALS — BP 120/72 | Ht 66.0 in | Wt 197.4 lb

## 2016-07-18 DIAGNOSIS — M25562 Pain in left knee: Secondary | ICD-10-CM | POA: Diagnosis not present

## 2016-07-18 DIAGNOSIS — M25561 Pain in right knee: Secondary | ICD-10-CM | POA: Diagnosis not present

## 2016-07-18 MED ORDER — IBUPROFEN 600 MG PO TABS: ORAL_TABLET | ORAL | 2 refills | Status: DC

## 2016-07-18 NOTE — Progress Notes (Signed)
   Subjective:    Patient ID: [name], male    DOB: 07/05/1991, 25 y.o.   MRN: [id number]  HPI Patient with c/o bilateral knee pain. Left knee is the worse. It felt worse when he squatted down and tried to get back up. This patient states his knees been bothering him over the past week it happened after he was squatting for several minutes he stood up and he felt a pop he had problems with his knees when he was younger. Had chondromalacia. Relates a fair amount of stiffness but no laxity and no locking or giving way  Review of Systems    denies hip pain foot pain and ankle pain back pain Objective:   Physical Exam Lungs clear heart regular ligaments are stable in the knees no click or pop felt. Good range of motion.       Assessment & Plan:  Bilateral knee pain probable overuse I find no evidence of ligament injury. I don't recommend x-ray currently try anti-inflammatory over the course of next 7-10 days. If progressive troubles or if worse may consider x-ray may consider orthopedic referral

## 2016-07-18 NOTE — Patient Instructions (Signed)
Please take ibuprofen one 3 times a day for 7 days then call if ongoing  May need a xray

## 2016-07-24 ENCOUNTER — Ambulatory Visit (INDEPENDENT_AMBULATORY_CARE_PROVIDER_SITE_OTHER): Payer: Medicaid Other | Admitting: Psychology

## 2016-07-24 DIAGNOSIS — F39 Unspecified mood [affective] disorder: Secondary | ICD-10-CM | POA: Diagnosis not present

## 2016-07-24 DIAGNOSIS — F429 Obsessive-compulsive disorder, unspecified: Secondary | ICD-10-CM | POA: Diagnosis not present

## 2016-08-03 ENCOUNTER — Ambulatory Visit (INDEPENDENT_AMBULATORY_CARE_PROVIDER_SITE_OTHER): Payer: Medicaid Other | Admitting: Psychology

## 2016-08-03 DIAGNOSIS — F429 Obsessive-compulsive disorder, unspecified: Secondary | ICD-10-CM | POA: Diagnosis not present

## 2016-08-03 DIAGNOSIS — F39 Unspecified mood [affective] disorder: Secondary | ICD-10-CM

## 2016-08-07 ENCOUNTER — Encounter (HOSPITAL_COMMUNITY): Payer: Self-pay | Admitting: Psychiatry

## 2016-08-07 ENCOUNTER — Ambulatory Visit (INDEPENDENT_AMBULATORY_CARE_PROVIDER_SITE_OTHER): Payer: Medicaid Other | Admitting: Psychiatry

## 2016-08-07 VITALS — BP 132/82 | HR 101 | Ht 66.0 in | Wt 195.2 lb

## 2016-08-07 DIAGNOSIS — F39 Unspecified mood [affective] disorder: Secondary | ICD-10-CM | POA: Diagnosis not present

## 2016-08-07 DIAGNOSIS — Z888 Allergy status to other drugs, medicaments and biological substances status: Secondary | ICD-10-CM | POA: Diagnosis not present

## 2016-08-07 DIAGNOSIS — F902 Attention-deficit hyperactivity disorder, combined type: Secondary | ICD-10-CM | POA: Diagnosis not present

## 2016-08-07 DIAGNOSIS — Z882 Allergy status to sulfonamides status: Secondary | ICD-10-CM

## 2016-08-07 DIAGNOSIS — Z79899 Other long term (current) drug therapy: Secondary | ICD-10-CM

## 2016-08-07 MED ORDER — LISDEXAMFETAMINE DIMESYLATE 40 MG PO CAPS: 40.0000 mg | ORAL_CAPSULE | ORAL | 0 refills | Status: DC

## 2016-08-07 MED ORDER — ESCITALOPRAM OXALATE 20 MG PO TABS: 20.0000 mg | ORAL_TABLET | Freq: Every day | ORAL | 2 refills | Status: DC

## 2016-08-07 NOTE — Progress Notes (Signed)
Patient ID: [name], male   DOB: 1991-01-23, 25 y.o.   MRN: [id number] Patient ID: [name], male   DOB: 17-Oct-1990, 25 y.o.   MRN: [id number] Patient ID: [name], male   DOB: 18-Feb-1991, 25 y.o.   MRN: [id number] Patient ID: [name], male   DOB: [birth year], 25 y.o.   MRN: [id number] Patient ID: [name], male   DOB: 23-Aug-1990, 24 y.o.   MRN: [id number] Patient ID: [name], male   DOB: Apr 28, 1991, 25 y.o.   MRN: [id number] Patient ID: [name], male   DOB: 02-01-1991, 25 y.o.   MRN: [id number] Patient ID: [name], male   DOB: November 22, 1990, 25 y.o.   MRN: [id number] Patient ID: [name], male   DOB: September 21, 1990, 25 y.o.   MRN: [id number] Patient ID: [name], male   DOB: [birth year], 26 y.o.   MRN: [id number] Patient ID: [name], male   DOB: Jul 10, 1991, 25 y.o.   MRN: [id number] Patient ID: [name], male   DOB: [birth year], 25 y.o.   MRN: [id number] Patient ID: [name], male   DOB: 20-Jun-1991, 25 y.o.   MRN: [id number] Patient ID: [name], male   DOB: 10-12-1990, 25 y.o.   MRN: [id number] Patient ID: [name], male   DOB: 1991-06-21, 25 y.o.   MRN: [id number] Patient ID: [name], male   DOB: [birth year], 25 y.o.   MRN: [id number] [place] [id number] Progress Note [name] MRN: [id number] DOB: 03/13/1991 Age: 25 y.o.  Date: 08/07/2016 Start Time: 2:55 PM End Time: 3:08 PM  Chief Complaint: Chief Complaint  Patient presents with  . ADHD  . Depression    Subjective: "My wife left me"  This patient is a 27 year old separated white male who has been living with his Children in [place] He is trying to reestablish disability   His mother reports that he was always a very hyperactive child wouldn't listen and acted out. He was constantly in trouble in school. He started on Ritalin at an early age but it made him like a zombie and he lost a lot of weight.  Over the years she's been on numerous stimulants. At age 22 his father took custody of him. He did not do well with his father and was very oppositional. He was eventually placed in a group home and went through 3 group homes to his teen years.  At age 32 he was arrested for continued contributing to do the deliquency of a minor. He was dating a 24 year old girl who ran away from home and was found with him he spent about 40 days in jail but he claims "turn my life around." Shortly after getting out of jail his father got ill with cancer and he spent a lot of time with him and was glad that they could reunite. The patient states he's currently doing well. He's under good control of his behavior. He's not particularly impulsive and is stable to stay focused. His mother states that he can't work full-time because of some onset something that upsets him he becomes very depressed. For now however he sleeping and eating well and seems to be happy with his wife He's had no further legal charges.  Patient returns after 3 months. For now he has primary custody of his children and his wife is getting weekend visitation. He states that she has been late bringing him back and he  is frustrated and he won't let her see them right now and he is trying to get permanent custody. 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 He is now on Lexapro and Vyvanse and he thinks his mood is fairly good most of the time. His grandmother recently died and he inherited a house in a car which has been quite helpful for him. The Vyvanse continues to help with his focus  History of Present Illness: He has been on Ritalin, Concerta, Focalin.  He has also been on Depakote, Abilify as well as Strattera. Allergies: Allergies  Allergen Reactions  . Strattera [Atomoxetine Hcl] Other (See Comments)    Became violent on Strattera.  . Bactrim [Sulfamethoxazole-Trimethoprim] Rash  . Sulfonamide Derivatives Rash    Medical History: Past Medical History:  Diagnosis Date  . Acne   . ADHD (attention deficit hyperactivity disorder)   . Obsessive-compulsive disorder   . Oppositional defiant disorder   . Unspecified episodic mood disorder    Surgical History: Past Surgical History:  Procedure Laterality Date  . TENDON LENGTHENING     leg tendon stretch as child  . TENDON LENGTHENING     age 2  . TOOTH EXTRACTION     Family History: family history includes Alcohol abuse in his paternal aunt; Bipolar disorder in his cousin; Drug abuse in his paternal aunt. Reviewed today in the office and nothing has changed.  Suicidal Ideation: No Plan Formed: No Patient has means to carry out plan: No  Homicidal Ideation: No Plan Formed: No Patient has means to carry out plan: No  Review of Systems: Psychiatric: Agitation: No Hallucination: No Depressed Mood: yes Insomnia: No Hypersomnia: No Altered Concentration: yes Feels Worthless: No Grandiose Ideas: No Belief In Special Powers: No New/Increased Substance Abuse: No Compulsions: No  Neurologic: Headache: No Seizure: No Paresthesias: No    Family History family history includes Alcohol abuse in his paternal aunt; Bipolar disorder in his cousin; Drug abuse in his paternal aunt.  Medications: Outpatient Encounter Prescriptions as of 08/07/2016  Medication Sig Dispense Refill  . albuterol (PROVENTIL HFA;VENTOLIN HFA) 108 (90 BASE) MCG/ACT inhaler Inhale 2 puffs into the lungs every 6 (six) hours as needed for wheezing. 1 Inhaler 0  . escitalopram (LEXAPRO) 20 MG tablet Take 1 tablet (20 mg total) by mouth daily. 30 tablet 2  . ibuprofen (ADVIL,MOTRIN) 600 MG tablet TAKE (1) TABLET EVERY EIGHT HOURS AS NEEDED. 90 tablet 2  . lisdexamfetamine (VYVANSE) 40 MG capsule Take 1 capsule (40 mg total) by mouth every morning. 30 capsule 0  . [DISCONTINUED] escitalopram (LEXAPRO) 20 MG tablet Take 1 tablet (20 mg total) by mouth daily. 30 tablet 2  .  [DISCONTINUED] lisdexamfetamine (VYVANSE) 40 MG capsule Take 1 capsule (40 mg total) by mouth every morning. 30 capsule 0  . lisdexamfetamine (VYVANSE) 40 MG capsule Take 1 capsule (40 mg total) by mouth every morning. 30 capsule 0  . lisdexamfetamine (VYVANSE) 40 MG capsule Take 1 capsule (40 mg total) by mouth every morning. 30 capsule 0  . [DISCONTINUED] lisdexamfetamine (VYVANSE) 40 MG capsule Take 1 capsule (40 mg total) by mouth every morning. (Patient not taking: Reported on 08/07/2016) 30 capsule 0  . [DISCONTINUED] lisdexamfetamine (VYVANSE) 40 MG capsule Take 1 capsule (40 mg total) by mouth every morning. (Patient not taking: Reported on 08/07/2016) 30 capsule 0   No facility-administered encounter medications on file as of 08/07/2016.     Past Psychiatric History/Hospitalization(s): Anxiety: No  Bipolar Disorder: Yes Depression: Yes Mania: No Psychosis: No Schizophrenia: No Personality Disorder: No Hospitalization for psychiatric illness: Yes History of Electroconvulsive Shock Therapy: No Prior Suicide Attempts: No  Physical Exam: Constitutional:  BP 132/82 (BP Location: Right Arm, Patient Position: Sitting, Cuff Size: Large)   Pulse (!) 101   Ht 5\' 6"  (1.676 m)   Wt 195 lb 3.2 oz (88.5 kg)   BMI 31.51 kg/m   General Appearance alert oriented   Musculoskeletal: Strength & Muscle Tone: within normal limits Gait & Station: normal Patient leans: N/A  Psychiatric: Speech (describe rate, volume, coherence, spontaneity, and abnormalities if any): Normal in volume rate and tone spontaneous  Thought Process (describe rate, content, abstract reasoning, and computation): Organized, goal-directed, age-appropriate  Associations: Intact  Thoughts: normal  Mental Status: Orientation: oriented to person, place, time/date and situation Mood & Affect: Mood is Up and down Attention Span & Concentration ok, but subjectively worse without Vyvanse  Lab Results:  Results for  orders placed or performed in visit on 09/30/15 (from the past 8736 hour(s))  TSH   Collection Time: 10/01/15  9:52 AM  Result Value Ref Range   TSH 0.862 0.450 - 4.500 uIU/mL  T4, free   Collection Time: 10/01/15  9:52 AM  Result Value Ref Range   Free T4 1.20 0.82 - 1.77 ng/dL  CBC with Differential/Platelet   Collection Time: 10/01/15  9:52 AM  Result Value Ref Range   WBC 6.4 3.4 - 10.8 x10E3/uL   RBC 5.18 4.14 - 5.80 x10E6/uL   Hemoglobin 14.9 12.6 - 17.7 g/dL   Hematocrit 44.5 37.5 - 51.0 %   MCV 86 79 - 97 fL   MCH 28.8 26.6 - 33.0 pg   MCHC 33.5 31.5 - 35.7 g/dL   RDW 14.9 12.3 - 15.4 %   Platelets 297 150 - 379 x10E3/uL   Neutrophils 58 %   Lymphs 33 %   Monocytes 7 %   Eos 2 %   Basos 0 %   Neutrophils Absolute 3.6 1.4 - 7.0 x10E3/uL   Lymphocytes Absolute 2.1 0.7 - 3.1 x10E3/uL   Monocytes Absolute 0.5 0.1 - 0.9 x10E3/uL   EOS (ABSOLUTE) 0.1 0.0 - 0.4 x10E3/uL   Basophils Absolute 0.0 0.0 - 0.2 x10E3/uL   Immature Granulocytes 0 %   Immature Grans (Abs) 0.0 0.0 - 0.1 x10E3/uL  Hepatic function panel   Collection Time: 10/01/15  9:52 AM  Result Value Ref Range   Total Protein 7.5 6.0 - 8.5 g/dL   Albumin 4.4 3.5 - 5.5 g/dL   Bilirubin Total <0.2 0.0 - 1.2 mg/dL   Bilirubin, Direct 0.07 0.00 - 0.40 mg/dL   Alkaline Phosphatase 76 39 - 117 IU/L   AST 22 0 - 40 IU/L   ALT 36 0 - 44 IU/L  Basic metabolic panel   Collection Time: 10/01/15  9:52 AM  Result Value Ref Range   Glucose 98 65 - 99 mg/dL   BUN 14 6 - 20 mg/dL   Creatinine, Ser 0.74 (L) 0.76 - 1.27 mg/dL   GFR calc non Af Amer 130 >59 mL/min/1.73   GFR calc Af Amer 150 >59 mL/min/1.73   BUN/Creatinine Ratio 19 8 - 19   Sodium 141 134 - 144 mmol/L   Potassium 4.8 3.5 - 5.2 mmol/L   Chloride 103 96 - 106 mmol/L   CO2 20 18 - 29 mmol/L   Calcium 9.5 8.7 - 10.2 mg/dL   PCP draws routine labs and nothing  is emerging as of concern.  Assessment: Axis I: Mood disorder NOS, ADHD combined type     Axis II: Deferred  Axis III: Wears glasses, acne,   Axis IV: Moderate  Axis V: 65  Plan: I took his vitals.  I reviewed CC, tobacco/med/surg Hx, meds effects/ side effects, problem list, therapies and responses as well as current situation/symptoms discussed options. He will continue Vyvanse  40 mg every morning ADHD symptoms.  We'll continue Lexapro 20 mg daily He'll return in 3 months See orders and pt instructions for more details.  MEDICATIONS this encounter: Meds ordered this encounter  Medications  . escitalopram (LEXAPRO) 20 MG tablet    Sig: Take 1 tablet (20 mg total) by mouth daily.    Dispense:  30 tablet    Refill:  2  . lisdexamfetamine (VYVANSE) 40 MG capsule    Sig: Take 1 capsule (40 mg total) by mouth every morning.    Dispense:  30 capsule    Refill:  0  . lisdexamfetamine (VYVANSE) 40 MG capsule    Sig: Take 1 capsule (40 mg total) by mouth every morning.    Dispense:  30 capsule    Refill:  0    Fill after 09/07/16  . lisdexamfetamine (VYVANSE) 40 MG capsule    Sig: Take 1 capsule (40 mg total) by mouth every morning.    Dispense:  30 capsule    Refill:  0    Fill after 10/08/16   Medical Decision Making Problem Points:  Established problem, stable/improving (1), Review of last therapy session (1), Review of psycho-social stressors (1) and Self-limited or minor (1) Data Points:  Review or order clinical lab tests (1) Review of medication regiment & side effects (2)  I certify that outpatient services furnished can reasonably be expected to improve the patient's condition.   Levonne Spiller, MD

## 2016-08-31 NOTE — Progress Notes (Signed)
PROGRESS NOTE  Patient:  [name]   DOB: Jan 01, 1991  MR Number: [id number]  Location: [facility] [address] [place] [address] Dept: [phone]  Start: 10 AM End: 11 AM  Provider/Observer:     Edgardo Roys PSYD  Chief Complaint:      Chief Complaint  Patient presents with  . Agitation  . Stress    Reason For Service:     The patient was referred by his treating psychiatrist Dr. Dwyane Dee, who felt that he needed to engage in some psychotherapeutic interventions and building some of his coping skills. The patient has been seen by Dr. Dwyane Dee through the [facility] mental health Department and then started seeing her here through [facility]. He is carried a number of diagnoses including conduct disorder with adolescent onset, attention deficit disorder with hyperactivity, and mood disorder NOS. He has been tried on a number of different medications including Concerta and Abilify as well as Depakote and Adderall. He does appear to be responding well to his current medications but there was a concern or interest in helping build more coping skills to do is reduced intellectual capacity and poor coping skills.  Interventions Strategy:  Cognitive/behavioral psychotherapy  Participation Level:   Active  Participation Quality:  Appropriate      Behavioral Observation:  Well Groomed, Alert, and Appropriate.   Current Psychosocial Factors: The patient reports that he has contineud to have mood discontrol and feeling overwhelmed by his mood.  The patient reports that he essentially is the only or primary parent to his two very young children and all his focus is on them.  Content of Session:   Reviewed current symptoms and continue to work on building coping skills and strategies.  Current Status:   Patient reports that he has been very upset and depressed.  He reports  that he is not having SI now, but just trying to take care of kids and adjust to wife leaving.  Patient Progress:   Patient very upset with current psychosocial issues.  Target Goals:   Reduce impulsive and maladaptive behaviors and engage in better use of coping skills.  Last Reviewed:   07/25/2016  Goals Addressed Today:    We continue to work on target goals of reducing the impulsive and manipulative behaviors and better at implementation of coping skills for judgment and future planning.  Impression/Diagnosis:   The patient has been diagnosed with a number of psychiatric conditions including mood disorder, attention deficit disorder, conduct disorder and other variations of these conditions.   As we got more information into his adulthood I do think that the primary diagnosis is one of a mood disorder. While he does have some significant attentional issues of impulsivity anxiety is becoming increasingly an issue and he has had frequent episodes of depression. The patient is very poor coping skills. He is trying to work on couple of occasions and they have all turned up very poorly. He was married for some time and has 2 children but the marriage essentially disintegrated because of his poor coping skills in combination with his wife's very poor coping skills. He has not been able to adapt to working situations. The patient has been quite unable to maintain a stable life and without significant assistance from his mother and other family members he was not been able to cope. His wife is essentially left him taking care of his 2  young children is the primary caregiver.  Diagnosis:    Axis I:  1. Mood disorder ([place])    2. Obsessive-compulsive disorder, unspecified type          RODENBOUGH,JOHN R, PsyD 08/31/2016

## 2016-09-03 ENCOUNTER — Ambulatory Visit (INDEPENDENT_AMBULATORY_CARE_PROVIDER_SITE_OTHER): Payer: Medicaid Other | Admitting: Psychology

## 2016-09-03 DIAGNOSIS — F429 Obsessive-compulsive disorder, unspecified: Secondary | ICD-10-CM

## 2016-09-03 DIAGNOSIS — F39 Unspecified mood [affective] disorder: Secondary | ICD-10-CM

## 2016-09-03 NOTE — Progress Notes (Signed)
PROGRESS NOTE  Patient:  [name]   DOB: 06/04/1991  MR Number: [id number]  Location: [facility] [address] [place] [address] Dept: [phone]  Start: 10 AM End: 11 AM  Provider/Observer:     Edgardo Roys PSYD  Chief Complaint:      Chief Complaint  Patient presents with  . Agitation  . Stress    Reason For Service:     The patient was referred by his treating psychiatrist Dr. Dwyane Dee, who felt that he needed to engage in some psychotherapeutic interventions and building some of his coping skills. The patient has been seen by Dr. Dwyane Dee through the [facility] mental health Department and then started seeing her here through [facility]. He is carried a number of diagnoses including conduct disorder with adolescent onset, attention deficit disorder with hyperactivity, and mood disorder NOS. He has been tried on a number of different medications including Concerta and Abilify as well as Depakote and Adderall. He does appear to be responding well to his current medications but there was a concern or interest in helping build more coping skills to do is reduced intellectual capacity and poor coping skills.  Interventions Strategy:  Cognitive/behavioral psychotherapy  Participation Level:   Active  Participation Quality:  Appropriate      Behavioral Observation:  Well Groomed, Alert, and Appropriate.   Current Psychosocial Factors: Today was our last visit with my move to another department.  The patient reoprts that he has continued to be one taking care of his two kids and his ex and done some but is not capable of being fully responsible and her new bf is described as being aggressive at times and having a bad temper.  The patient is watching the sitation closely when he lets the kids visit their mother.  Content of Session:   Reviewed current symptoms and  continue to work on building coping skills and strategies.  Current Status:   Patient reports that he has been very upset and depressed.  He reports that he is not having SI now, but just trying to take care of kids and adjust to wife leaving.  Patient Progress:   Patient very upset with current psychosocial issues.  Target Goals:   Reduce impulsive and maladaptive behaviors and engage in better use of coping skills.  Last Reviewed:   09/03/2016  Goals Addressed Today:    We continue to work on target goals of reducing the impulsive and manipulative behaviors and better at implementation of coping skills for judgment and future planning.  Impression/Diagnosis:   The patient has been diagnosed with a number of psychiatric conditions including mood disorder, attention deficit disorder, conduct disorder and other variations of these conditions.   As we got more information into his adulthood I do think that the primary diagnosis is one of a mood disorder. While he does have some significant attentional issues of impulsivity anxiety is becoming increasingly an issue and he has had frequent episodes of depression. The patient is very poor coping skills. He is trying to work on couple of occasions and they have all turned up very poorly. He was married for some time and has 2 children but the marriage essentially disintegrated because of his poor coping skills in combination with his wife's very poor coping skills. He has not been able to adapt to working situations. The patient has been quite unable to maintain a  stable life and without significant assistance from his mother and other family members he was not been able to cope. His wife is essentially left him taking care of his 2 young children is the primary caregiver.  Diagnosis:    Axis I:  1. Mood disorder ([place])    2. Obsessive-compulsive disorder, unspecified type          Zaylei Mullane R, PsyD 09/03/2016

## 2016-09-03 NOTE — Progress Notes (Signed)
PROGRESS NOTE  Patient:  [name]   DOB: [birth year]  MR Number: [id number]  Location: [facility] [address] [place] [address] Dept: [phone]  Start: 10 AM End: 11 AM  Provider/Observer:     Edgardo Roys PSYD  Chief Complaint:      Chief Complaint  Patient presents with  . Agitation  . Anxiety  . Depression  . Stress    Reason For Service:     The patient was referred by his treating psychiatrist Dr. Dwyane Dee, who felt that he needed to engage in some psychotherapeutic interventions and building some of his coping skills. The patient has been seen by Dr. Dwyane Dee through the [facility] mental health Department and then started seeing her here through [facility]. He is carried a number of diagnoses including conduct disorder with adolescent onset, attention deficit disorder with hyperactivity, and mood disorder NOS. He has been tried on a number of different medications including Concerta and Abilify as well as Depakote and Adderall. He does appear to be responding well to his current medications but there was a concern or interest in helping build more coping skills to do is reduced intellectual capacity and poor coping skills.  Interventions Strategy:  Cognitive/behavioral psychotherapy  Participation Level:   Active  Participation Quality:  Appropriate      Behavioral Observation:  Well Groomed, Alert, and Appropriate.   Current Psychosocial Factors: The patient reports that he has contineud to have mood discontrol and feeling overwhelmed by his mood.  He has worked on this and does well when kids are around.  He has also started dating someone but they are not sexually active and he is taking it very slow due to what has happened to him with his ex.    Content of Session:   Reviewed current symptoms and continue to work on building coping skills and  strategies.  Current Status:   Patient reports that he has been very upset and depressed.  He reports that this depression has improved some lately.  Patient Progress:   Patient very upset with current psychosocial issues.  Target Goals:   Reduce impulsive and maladaptive behaviors and engage in better use of coping skills.  Last Reviewed:   08/03/2016  Goals Addressed Today:    We continue to work on target goals of reducing the impulsive and manipulative behaviors and better at implementation of coping skills for judgment and future planning.  Impression/Diagnosis:   The patient has been diagnosed with a number of psychiatric conditions including mood disorder, attention deficit disorder, conduct disorder and other variations of these conditions.   As we got more information into his adulthood I do think that the primary diagnosis is one of a mood disorder. While he does have some significant attentional issues of impulsivity anxiety is becoming increasingly an issue and he has had frequent episodes of depression. The patient is very poor coping skills. He is trying to work on couple of occasions and they have all turned up very poorly. He was married for some time and has 2 children but the marriage essentially disintegrated because of his poor coping skills in combination with his wife's very poor coping skills. He has not been able to adapt to working situations. The patient has been quite unable to maintain a stable life and without significant assistance from his mother and other family members he was not been able to  cope. His wife is essentially left him taking care of his 2 young children is the primary caregiver.  Diagnosis:    Axis I:  1. Mood disorder ([place])    2. Obsessive-compulsive disorder, unspecified type          Rainen Vanrossum R, PsyD 09/03/2016

## 2016-10-25 ENCOUNTER — Telehealth (HOSPITAL_COMMUNITY): Payer: Self-pay | Admitting: *Deleted

## 2016-10-25 NOTE — Telephone Encounter (Signed)
left voice message, provider out of office 11/05/16. 

## 2016-11-01 ENCOUNTER — Ambulatory Visit (INDEPENDENT_AMBULATORY_CARE_PROVIDER_SITE_OTHER): Payer: Medicaid Other | Admitting: Psychiatry

## 2016-11-01 ENCOUNTER — Telehealth (HOSPITAL_COMMUNITY): Payer: Self-pay | Admitting: Psychiatry

## 2016-11-01 ENCOUNTER — Encounter (HOSPITAL_COMMUNITY): Payer: Self-pay | Admitting: Psychiatry

## 2016-11-01 VITALS — BP 126/86 | HR 92 | Ht 66.0 in | Wt 196.0 lb

## 2016-11-01 DIAGNOSIS — F39 Unspecified mood [affective] disorder: Secondary | ICD-10-CM | POA: Diagnosis not present

## 2016-11-01 DIAGNOSIS — Z811 Family history of alcohol abuse and dependence: Secondary | ICD-10-CM

## 2016-11-01 DIAGNOSIS — Z813 Family history of other psychoactive substance abuse and dependence: Secondary | ICD-10-CM

## 2016-11-01 DIAGNOSIS — F902 Attention-deficit hyperactivity disorder, combined type: Secondary | ICD-10-CM

## 2016-11-01 DIAGNOSIS — Z818 Family history of other mental and behavioral disorders: Secondary | ICD-10-CM

## 2016-11-01 MED ORDER — LISDEXAMFETAMINE DIMESYLATE 40 MG PO CAPS: 40.0000 mg | ORAL_CAPSULE | ORAL | 0 refills | Status: DC

## 2016-11-01 MED ORDER — ESCITALOPRAM OXALATE 20 MG PO TABS: 20.0000 mg | ORAL_TABLET | Freq: Every day | ORAL | 2 refills | Status: DC

## 2016-11-01 NOTE — Progress Notes (Signed)
Patient ID: [name], male   DOB: 1991/03/27, 26 y.o.   MRN: [id number] Patient ID: [name], male   DOB: [birth year], 26 y.o.   MRN: [id number] Patient ID: [name], male   DOB: 08-17-1991, 26 y.o.   MRN: [id number] Patient ID: [name], male   DOB: [birth year], 26 y.o.   MRN: [id number] Patient ID: [name], male   DOB: April 28, 1991, 26 y.o.   MRN: [id number] Patient ID: [name], male   DOB: 1991/02/01, 26 y.o.   MRN: [id number] Patient ID: [name], male   DOB: Mar 14, 1991, 26 y.o.   MRN: [id number] Patient ID: [name], male   DOB: [birth year], 26 y.o.   MRN: [id number] Patient ID: [name], male   DOB: 26-Feb-1991, 26 y.o.   MRN: [id number] Patient ID: [name], male   DOB: Jan 03, 1991, 26 y.o.   MRN: [id number] Patient ID: [name], male   DOB: Nov 04, 1990, 26 y.o.   MRN: [id number] Patient ID: [name], male   DOB: 02/15/1991, 26 y.o.   MRN: [id number] Patient ID: [name], male   DOB: 06-07-1991, 26 y.o.   MRN: [id number] Patient ID: [name], male   DOB: July 12, 1991, 26 y.o.   MRN: [id number] Patient ID: [name], male   DOB: 09/07/1990, 26 y.o.   MRN: [id number] Patient ID: [name], male   DOB: 12/24/1990, 26 y.o.   MRN: [id number] [name] MRN: [id number] DOB: [birth year] Age: 26 y.o.  Date: 11/01/2016 Start Time: 2:55 PM End Time: 3:08 PM  Chief Complaint: Chief Complaint  Patient presents with  . Anxiety  . ADHD  . Follow-up    Subjective: "My wife left me"  This patient is a 26 year old separated white male who has been living with his Children in [place] He is trying to reestablish disability   His mother reports that he was always a very hyperactive child wouldn't listen and acted out. He was constantly in trouble in school. He started on Ritalin at an early age but it made him like a zombie and he lost a lot  of weight. Over the years she's been on numerous stimulants. At age 4 his father took custody of him. He did not do well with his father and was very oppositional. He was eventually placed in a group home and went through 3 group homes to his teen years.  At age 68 he was arrested for continued contributing to do the deliquency of a minor. He was dating a 25 year old girl who ran away from home and was found with him he spent about 40 days in jail but he claims "turn my life around." Shortly after getting out of jail his father got ill with cancer and he spent a lot of time with him and was glad that they could reunite. The patient states he's currently doing well. He's under good control of his behavior. He's not particularly impulsive and is stable to stay focused. His mother states that he can't work full-time because of some onset something that upsets him he becomes very depressed. For now however he sleeping and eating well and seems to be happy with his wife He's had no further legal charges.  Patient returns after 3 months. He states that he is trying to find work since he has lost his disability. He inherited some money on a house in a car  from his grandmother so he is not in dire straits. He is angry because he has some misdemeanors on his record and it seems like it's difficult for employers to get past this. For the most part his mood is good and he is staying well focused. His therapist here left and were going to try to find him another one in [place]  History of Present Illness: He has been on Ritalin, Concerta, Focalin.  He has also been on Depakote, Abilify as well as Strattera. Allergies: Allergies  Allergen Reactions  . Strattera [Atomoxetine Hcl] Other (See Comments)    Became violent on Strattera.  . Bactrim [Sulfamethoxazole-Trimethoprim] Rash  . Sulfonamide Derivatives Rash   Medical History: Past Medical History:  Diagnosis Date  . Acne   . ADHD (attention deficit  hyperactivity disorder)   . Obsessive-compulsive disorder   . Oppositional defiant disorder   . Unspecified episodic mood disorder    Surgical History: Past Surgical History:  Procedure Laterality Date  . TENDON LENGTHENING     leg tendon stretch as child  . TENDON LENGTHENING     age 18  . TOOTH EXTRACTION     Family History: family history includes Alcohol abuse in his paternal aunt; Bipolar disorder in his cousin; Drug abuse in his paternal aunt. Reviewed today in the office and nothing has changed.  Suicidal Ideation: No Plan Formed: No Patient has means to carry out plan: No  Homicidal Ideation: No Plan Formed: No Patient has means to carry out plan: No  Review of Systems: Psychiatric: Agitation: No Hallucination: No Depressed Mood: yes Insomnia: No Hypersomnia: No Altered Concentration: yes Feels Worthless: No Grandiose Ideas: No Belief In Special Powers: No New/Increased Substance Abuse: No Compulsions: No  Neurologic: Headache: No Seizure: No Paresthesias: No    Family History family history includes Alcohol abuse in his paternal aunt; Bipolar disorder in his cousin; Drug abuse in his paternal aunt.  Medications: Outpatient Encounter Prescriptions as of 11/01/2016  Medication Sig  . albuterol (PROVENTIL HFA;VENTOLIN HFA) 108 (90 BASE) MCG/ACT inhaler Inhale 2 puffs into the lungs every 6 (six) hours as needed for wheezing.  Marland Kitchen escitalopram (LEXAPRO) 20 MG tablet Take 1 tablet (20 mg total) by mouth daily.  Marland Kitchen ibuprofen (ADVIL,MOTRIN) 600 MG tablet TAKE (1) TABLET EVERY EIGHT HOURS AS NEEDED.  Marland Kitchen lisdexamfetamine (VYVANSE) 40 MG capsule Take 1 capsule (40 mg total) by mouth every morning.  . lisdexamfetamine (VYVANSE) 40 MG capsule Take 1 capsule (40 mg total) by mouth every morning.  . lisdexamfetamine (VYVANSE) 40 MG capsule Take 1 capsule (40 mg total) by mouth every morning.  . [DISCONTINUED] escitalopram (LEXAPRO) 20 MG tablet Take 1 tablet (20 mg total)  by mouth daily.  . [DISCONTINUED] lisdexamfetamine (VYVANSE) 40 MG capsule Take 1 capsule (40 mg total) by mouth every morning.  . [DISCONTINUED] lisdexamfetamine (VYVANSE) 40 MG capsule Take 1 capsule (40 mg total) by mouth every morning.  . [DISCONTINUED] lisdexamfetamine (VYVANSE) 40 MG capsule Take 1 capsule (40 mg total) by mouth every morning.   No facility-administered encounter medications on file as of 11/01/2016.     Past Psychiatric History/Hospitalization(s): Anxiety: No Bipolar Disorder: Yes Depression: Yes Mania: No Psychosis: No Schizophrenia: No Personality Disorder: No Hospitalization for psychiatric illness: Yes History of Electroconvulsive Shock Therapy: No Prior Suicide Attempts: No  Physical Exam: Constitutional:  BP 126/86 (BP Location: Left Arm, Patient Position: Sitting, Cuff Size: Normal)   Pulse 92   Ht 5\' 6"  (1.676 m)   Wt  196 lb (88.9 kg)   BMI 31.64 kg/m   General Appearance alert oriented   Musculoskeletal: Strength & Muscle Tone: within normal limits Gait & Station: normal Patient leans: N/A  Psychiatric: Speech (describe rate, volume, coherence, spontaneity, and abnormalities if any): Normal in volume rate and tone spontaneous  Thought Process (describe rate, content, abstract reasoning, and computation): Organized, goal-directed, age-appropriate  Associations: Intact  Thoughts: normal  Mental Status: Orientation: oriented to person, place, time/date and situation Mood & Affect: Mood is Up and down, ok for the most part Attention Span & Concentration ok, but subjectively worse without Vyvanse  Lab Results:  No results found for this or any previous visit (from the past 8736 hour(s)). PCP draws routine labs and nothing is emerging as of concern.  Assessment: Axis I: Mood disorder NOS, ADHD combined type    Axis II: Deferred  Axis III: Wears glasses, acne,   Axis IV: Moderate  Axis V: 65  Plan: I took his vitals.  I  reviewed CC, tobacco/med/surg Hx, meds effects/ side effects, problem list, therapies and responses as well as current situation/symptoms discussed options. He will continue Vyvanse  40 mg every morning ADHD symptoms.  We'll continue Lexapro 20 mg daily He'll return in 3 months See orders and pt instructions for more details.  MEDICATIONS this encounter: Meds ordered this encounter  Medications  . escitalopram (LEXAPRO) 20 MG tablet    Sig: Take 1 tablet (20 mg total) by mouth daily.    Dispense:  30 tablet    Refill:  2  . lisdexamfetamine (VYVANSE) 40 MG capsule    Sig: Take 1 capsule (40 mg total) by mouth every morning.    Dispense:  30 capsule    Refill:  0  . lisdexamfetamine (VYVANSE) 40 MG capsule    Sig: Take 1 capsule (40 mg total) by mouth every morning.    Dispense:  30 capsule    Refill:  0    Fill after 12/01/16  . lisdexamfetamine (VYVANSE) 40 MG capsule    Sig: Take 1 capsule (40 mg total) by mouth every morning.    Dispense:  30 capsule    Refill:  0    Fill after 12/31/16   Medical Decision Making Problem Points:  Established problem, stable/improving (1), Review of last therapy session (1), Review of psycho-social stressors (1) and Self-limited or minor (1) Data Points:  Review or order clinical lab tests (1) Review of medication regiment & side effects (2)  I certify that outpatient services furnished can reasonably be expected to improve the patient's condition.   Levonne Spiller, MD

## 2016-11-05 ENCOUNTER — Ambulatory Visit (HOSPITAL_COMMUNITY): Payer: Self-pay | Admitting: Psychiatry

## 2016-11-06 ENCOUNTER — Encounter (HOSPITAL_COMMUNITY): Payer: Self-pay | Admitting: Licensed Clinical Social Worker

## 2016-11-06 ENCOUNTER — Ambulatory Visit (INDEPENDENT_AMBULATORY_CARE_PROVIDER_SITE_OTHER): Payer: Medicaid Other | Admitting: Licensed Clinical Social Worker

## 2016-11-06 DIAGNOSIS — F39 Unspecified mood [affective] disorder: Secondary | ICD-10-CM | POA: Diagnosis not present

## 2016-11-06 NOTE — Progress Notes (Signed)
   THERAPIST PROGRESS NOTE  Session Time: 11am-12pm  Participation Level: Active  Behavioral Response: NeatAlertAnxious and Euthymic  Type of Therapy: Individual Therapy  Treatment Goals addressed: Anxiety and Coping  Interventions: CBT and Reframing  Summary: [name] is a 26 y.o. male who presents with anxious mood, pressured speech, tearfulness, and hx of ADHD, OCD and relationship problems.   Suicidal/Homicidal: Nowithout intent/plan  Therapist Response: Counselor assessed pt. for his first individual session with new counselor. Pt. stated he loved his old therapist, Dr. Sima Matas, and was looking for a counselor who could help him with building coping skills to manage his anxiety, anger towards gf, and tearfulness. Pt. reported he was overwhelmed by all that he has to manage at home. He states he lives in a home that his grandmother left him with his mother and his two kids. Counselor used open questions, immediacy, and reflection to build rapport and establish a working therapeutic relationship. Pt. agreed he enjoyed session and wanted to return again in about 2 weeks. Counselor stated they will work on tx goals of managing anger and mood lability.   Plan: Return again in 3 weeks.     [name], LPCA 11/06/2016

## 2016-11-20 ENCOUNTER — Ambulatory Visit (HOSPITAL_COMMUNITY): Payer: Self-pay | Admitting: Licensed Clinical Social Worker

## 2016-11-27 ENCOUNTER — Ambulatory Visit (INDEPENDENT_AMBULATORY_CARE_PROVIDER_SITE_OTHER): Payer: Medicaid Other | Admitting: Licensed Clinical Social Worker

## 2016-11-27 ENCOUNTER — Encounter (HOSPITAL_COMMUNITY): Payer: Self-pay | Admitting: Licensed Clinical Social Worker

## 2016-11-27 DIAGNOSIS — F39 Unspecified mood [affective] disorder: Secondary | ICD-10-CM | POA: Diagnosis not present

## 2016-11-27 NOTE — Progress Notes (Signed)
   THERAPIST PROGRESS NOTE  Session Time: 1-2pm  Participation Level: Active  Behavioral Response: NeatAlertEuthymic and tearful  Type of Therapy: Individual Therapy  Treatment Goals addressed: Anger and Coping  Interventions: CBT and Motivational Interviewing  Summary: [name] is a 26 y.o. male who presents with an unspecified mood disturbance, family problems, and unresolved grief from loss of father.   Suicidal/Homicidal: Yeswithout intent/plan   Therapist Response: Counselor continued to assess pt.. Pt. stated he loved his old therapist, Dr. Sima Matas, and was looking for a counselor who could help him with building coping skills to manage his anxiety, anger towards gf, and tearfulness. Pt. continues to report he feels overwhelmed by all that he has to manage at home. Pt. stated his anger continues to "pester him" but that he sees things improving in his relationship with his gf. Pt. disclosed that he does not agree with the way his mother treats his children and feels helpless to change that. Pt. shared that he felt much better having someone to talk to. He stated at the end of session that had a vague thought of wanting to intentionally wreck his automobile last week but stated he has no intention of completing suicide and that it was a passing, vague thought, "the kind he knows how to manage".    Plan: Return again in 3 weeks.  Diagnosis: Axis I: Mood Disorder NOS        [name], LPCA 11/27/2016

## 2016-12-12 ENCOUNTER — Telehealth: Payer: Self-pay | Admitting: Family Medicine

## 2016-12-12 NOTE — Telephone Encounter (Signed)
Error

## 2016-12-25 ENCOUNTER — Encounter (HOSPITAL_COMMUNITY): Payer: Self-pay | Admitting: Licensed Clinical Social Worker

## 2016-12-25 ENCOUNTER — Telehealth: Payer: Self-pay | Admitting: Family Medicine

## 2016-12-25 ENCOUNTER — Ambulatory Visit (INDEPENDENT_AMBULATORY_CARE_PROVIDER_SITE_OTHER): Payer: Medicaid Other | Admitting: Licensed Clinical Social Worker

## 2016-12-25 DIAGNOSIS — F39 Unspecified mood [affective] disorder: Secondary | ICD-10-CM

## 2016-12-25 NOTE — Progress Notes (Signed)
   THERAPIST PROGRESS NOTE  Session Time: 1:10-2pm  Participation Level: Active  Behavioral Response: NeatAlertEuthymic  Type of Therapy: Individual Therapy  Treatment Goals addressed: Anger and Anxiety  Interventions: CBT and Psychosocial Skills: Communication, 5 love languages  Summary: [name] is a 26 y.o. male who presents with an unspecified episodic mood disorder. Patient reports improved mood and communication with his mother and girlfriend. Pt states he asked his mother to not "lay hands on his kids" and his mother has agreed. He states his girlfriend continues to be upset with him for having "any kind of relationship" with his children's mother. Pt states he has to maintain a relationship with his children's mother (his ex) for legal reasons only and he is working to get his current girlfriend to accept this. Patient was active and engaged in session and talked excessively. He did not appear to respond to interventions from counselor since pt would frequently change the topic and say "everything is fine, I guess". Pt stated he wants to see counselor again in 4 weeks.   Suicidal/Homicidal: Nowithout intent/plan  Therapist Response: Counselor attempted to offer feedback on pt style of communication and pt did not appear to accept it. He was agreeable but dismissive in his responses to counselor and did not appear curious about ways to amend his situation with his girlfriend. Patient appears to have gotten relief from his tearfulness, moodiness, and depression. Will monitor to see sustained change.   Plan: Return again in 4 weeks.  Diagnosis:    ICD-9-CM ICD-10-CM   1. Unspecified mood (affective) disorder [facility]) 296.90 [place], LPCA 12/25/2016

## 2016-12-25 NOTE — Telephone Encounter (Signed)
Pt is needing refills on albuterol (PROVENTIL HFA;VENTOLIN HFA) 108 (90 BASE) MCG/ACT inhaler ibuprofen (ADVIL,MOTRIN) 600 MG tablet loratadine (CLARITIN) 10 MG tablet  Pt needs the inhaler called in today,because he needs to take it with him to get his wisdom teeth cut out tomorrow.   [place]

## 2016-12-25 NOTE — Telephone Encounter (Signed)
May refill medicine 6 on all

## 2016-12-26 MED ORDER — IBUPROFEN 600 MG PO TABS: ORAL_TABLET | ORAL | 2 refills | Status: DC

## 2016-12-26 MED ORDER — ALBUTEROL SULFATE HFA 108 (90 BASE) MCG/ACT IN AERS: 2.0000 | INHALATION_SPRAY | Freq: Four times a day (QID) | RESPIRATORY_TRACT | 5 refills | Status: DC | PRN

## 2016-12-26 MED ORDER — LORATADINE 10 MG PO TABS: 10.0000 mg | ORAL_TABLET | Freq: Every day | ORAL | 5 refills | Status: DC

## 2016-12-26 NOTE — Telephone Encounter (Signed)
Prescriptions sent electronically to pharmacy. Patient notified. °

## 2017-01-29 ENCOUNTER — Encounter (HOSPITAL_COMMUNITY): Payer: Self-pay | Admitting: Licensed Clinical Social Worker

## 2017-01-29 ENCOUNTER — Ambulatory Visit (INDEPENDENT_AMBULATORY_CARE_PROVIDER_SITE_OTHER): Payer: Medicaid Other | Admitting: Licensed Clinical Social Worker

## 2017-01-29 DIAGNOSIS — F39 Unspecified mood [affective] disorder: Secondary | ICD-10-CM | POA: Diagnosis not present

## 2017-01-29 NOTE — Progress Notes (Signed)
   THERAPIST PROGRESS NOTE  Session Time: 1:10-2pm  Participation Level: Active  Behavioral Response: NeatAlertEuthymic  Type of Therapy: Individual Therapy  Treatment Goals addressed: Anger and Anxiety  Interventions: CBT and Psychosocial Skills: Communication, 5 love languages  Summary: AKIVA BRASSFIELD is a 26 y.o. male who presents with an unspecified episodic mood disorder. Patient reports improved mood and communication with his mother and girlfriend. Pt states he is now officially divorced from his ex wife and this pleases his current gf. He asked counselor to consult with this disability advocate/lawyer concerning his disability claim. Counselor and pt discssuesed the disability claim and pt's desire to work. He states he prefers to work but cannot get a job due to a misdemeanor of assault with a  deadly weapon. He states he has applied at at least 7 different positions and does not receive return calls. Pt states he wants to return to school in the future and pursue a job in Proofreader.  Suicidal/Homicidal: Nowithout intent/plan  Therapist Response: Pt making good progress. Counselor was honest about "disability seeking" and pt responded cooperatively, stating he "wished he could work but he is unable to get a job". Counselor advised pt to seek employment from places that have more lenient background checks. Pt appeared in a positive mood and was markedly stable, lucid, and talkative during session.   Plan: Return again in 4 weeks.  Diagnosis:    ICD-10-CM   1. Unspecified mood (affective) disorder (HCC) F39

## 2017-01-31 ENCOUNTER — Ambulatory Visit (INDEPENDENT_AMBULATORY_CARE_PROVIDER_SITE_OTHER): Payer: Medicaid Other | Admitting: Psychiatry

## 2017-01-31 ENCOUNTER — Encounter (HOSPITAL_COMMUNITY): Payer: Self-pay | Admitting: Psychiatry

## 2017-01-31 VITALS — BP 138/80 | HR 86 | Ht 66.0 in | Wt 199.2 lb

## 2017-01-31 DIAGNOSIS — Z888 Allergy status to other drugs, medicaments and biological substances status: Secondary | ICD-10-CM

## 2017-01-31 DIAGNOSIS — Z79899 Other long term (current) drug therapy: Secondary | ICD-10-CM | POA: Diagnosis not present

## 2017-01-31 DIAGNOSIS — F902 Attention-deficit hyperactivity disorder, combined type: Secondary | ICD-10-CM

## 2017-01-31 DIAGNOSIS — Z791 Long term (current) use of non-steroidal anti-inflammatories (NSAID): Secondary | ICD-10-CM | POA: Diagnosis not present

## 2017-01-31 DIAGNOSIS — Z818 Family history of other mental and behavioral disorders: Secondary | ICD-10-CM | POA: Diagnosis not present

## 2017-01-31 DIAGNOSIS — Z811 Family history of alcohol abuse and dependence: Secondary | ICD-10-CM

## 2017-01-31 DIAGNOSIS — F39 Unspecified mood [affective] disorder: Secondary | ICD-10-CM

## 2017-01-31 DIAGNOSIS — Z882 Allergy status to sulfonamides status: Secondary | ICD-10-CM

## 2017-01-31 DIAGNOSIS — Z814 Family history of other substance abuse and dependence: Secondary | ICD-10-CM

## 2017-01-31 MED ORDER — LISDEXAMFETAMINE DIMESYLATE 40 MG PO CAPS: 40.0000 mg | ORAL_CAPSULE | ORAL | 0 refills | Status: DC

## 2017-01-31 MED ORDER — ESCITALOPRAM OXALATE 20 MG PO TABS: 20.0000 mg | ORAL_TABLET | Freq: Every day | ORAL | 2 refills | Status: DC

## 2017-01-31 NOTE — Progress Notes (Signed)
Patient ID: KRISH BAILLY, male   DOB: 07-17-1991, 26 y.o.   MRN: [id number] Patient ID: ALPHONS BURGERT, male   DOB: 08-16-1991, 26 y.o.   MRN: [id number] Patient ID: TAHJIR SILVERIA, male   DOB: 10-Feb-1991, 26 y.o.   MRN: [id number] Patient ID: BERNICE MULLIN, male   DOB: 20-Oct-1990, 26 y.o.   MRN: [id number] Patient ID: LIBERTY STEAD, male   DOB: 1991/02/05, 26 y.o.   MRN: [id number] Patient ID: DANYELL AWBREY, male   DOB: 1991/06/07, 26 y.o.   MRN: [id number] Patient ID: SEKAI GITLIN, male   DOB: 11/04/1990, 26 y.o.   MRN: [id number] Patient ID: LANDIN TALLON, male   DOB: 09/23/1990, 26 y.o.   MRN: [id number] Patient ID: LATERRANCE NAUTA, male   DOB: 1990/10/26, 26 y.o.   MRN: [id number] Patient ID: KINGSTIN HEIMS, male   DOB: [birth year], 26 y.o.   MRN: [id number] Patient ID: JAMAHL LEMMONS, male   DOB: 1990-10-10, 26 y.o.   MRN: [id number] Patient ID: STACY DESHLER, male   DOB: 13-Oct-1990, 26 y.o.   MRN: [id number] Patient ID: RYSHAWN SANZONE, male   DOB: [birth year], 26 y.o.   MRN: [id number] Patient ID: KUSH FARABEE, male   DOB: [birth year], 26 y.o.   MRN: [id number] Patient ID: AQIL GOETTING, male   DOB: 1991/03/14, 26 y.o.   MRN: [id number] Patient ID: AKAASH VANDEWATER, male   DOB: 1990/11/27, 26 y.o.   MRN: [id number] [place] [address] Progress Note DANEL REQUENA MRN: [id number] DOB: 1991-03-18 Age: 26 y.o.  Date: 01/31/2017 Start Time: 2:55 PM End Time: 3:08 PM  Chief Complaint: Chief Complaint  Patient presents with  . Anxiety  . ADHD  . Follow-up    Subjective:"I'm divorced now  This patient is a 26 year old separated white male who has been living with his Children in [place] He is trying to reestablish disability   His mother reports that he was always a very hyperactive child wouldn't listen and acted out. He was constantly in trouble in school. He started on Ritalin at an early age but it made him like a zombie and he lost a lot  of weight. Over the years she's been on numerous stimulants. At age 69 his father took custody of him. He did not do well with his father and was very oppositional. He was eventually placed in a group home and went through 3 group homes to his teen years.  At age 68 he was arrested for continued contributing to do the deliquency of a minor. He was dating a 25 year old girl who ran away from home and was found with him he spent about 40 days in jail but he claims "turn my life around." Shortly after getting out of jail his father got ill with cancer and he spent a lot of time with him and was glad that they could reunite. The patient states he's currently doing well. He's under good control of his behavior. He's not particularly impulsive and is stable to stay focused. His mother states that he can't work full-time because of some onset something that upsets him he becomes very depressed. For now however he sleeping and eating well and seems to be happy with his wife He's had no further legal charges.  Patient returns after 3 months. He states that he has formally divorced his wife. He's living in the house inherited from his grandmother although he had to pay for it to buy  off his aunt. He states his mood is good and his energy is good. He has added Hydroxycut which is an herbal supplement for weight loss. I told him to be careful because he is already on Vyvanse which is also a stimulant. Today his blood pressure and pulse are normal  History of Present Illness: He has been on Ritalin, Concerta, Focalin.  He has also been on Depakote, Abilify as well as Strattera. Allergies: Allergies  Allergen Reactions  . Strattera [Atomoxetine Hcl] Other (See Comments)    Became violent on Strattera.  . Bactrim [Sulfamethoxazole-Trimethoprim] Rash  . Sulfonamide Derivatives Rash   Medical History: Past Medical History:  Diagnosis Date  . Acne   . ADHD (attention deficit hyperactivity disorder)   .  Obsessive-compulsive disorder   . Oppositional defiant disorder   . Unspecified episodic mood disorder    Surgical History: Past Surgical History:  Procedure Laterality Date  . TENDON LENGTHENING     leg tendon stretch as child  . TENDON LENGTHENING     age 43  . TOOTH EXTRACTION     Family History: family history includes Alcohol abuse in his paternal aunt; Bipolar disorder in his cousin; Drug abuse in his paternal aunt. Reviewed today in the office and nothing has changed.  Suicidal Ideation: No Plan Formed: No Patient has means to carry out plan: No  Homicidal Ideation: No Plan Formed: No Patient has means to carry out plan: No  Review of Systems: Psychiatric: Agitation: No Hallucination: No Depressed Mood: yes Insomnia: No Hypersomnia: No Altered Concentration: yes Feels Worthless: No Grandiose Ideas: No Belief In Special Powers: No New/Increased Substance Abuse: No Compulsions: No  Neurologic: Headache: No Seizure: No Paresthesias: No    Family History family history includes Alcohol abuse in his paternal aunt; Bipolar disorder in his cousin; Drug abuse in his paternal aunt.  Medications: Outpatient Encounter Prescriptions as of 01/31/2017  Medication Sig  . albuterol (PROVENTIL HFA;VENTOLIN HFA) 108 (90 Base) MCG/ACT inhaler Inhale 2 puffs into the lungs every 6 (six) hours as needed for wheezing.  Marland Kitchen escitalopram (LEXAPRO) 20 MG tablet Take 1 tablet (20 mg total) by mouth daily.  Marland Kitchen ibuprofen (ADVIL,MOTRIN) 600 MG tablet TAKE (1) TABLET EVERY EIGHT HOURS AS NEEDED.  Marland Kitchen lisdexamfetamine (VYVANSE) 40 MG capsule Take 1 capsule (40 mg total) by mouth every morning.  . loratadine (CLARITIN) 10 MG tablet Take 1 tablet (10 mg total) by mouth daily.  . [DISCONTINUED] escitalopram (LEXAPRO) 20 MG tablet Take 1 tablet (20 mg total) by mouth daily.  . [DISCONTINUED] lisdexamfetamine (VYVANSE) 40 MG capsule Take 1 capsule (40 mg total) by mouth every morning.  .  lisdexamfetamine (VYVANSE) 40 MG capsule Take 1 capsule (40 mg total) by mouth every morning.  . lisdexamfetamine (VYVANSE) 40 MG capsule Take 1 capsule (40 mg total) by mouth every morning.  . [DISCONTINUED] lisdexamfetamine (VYVANSE) 40 MG capsule Take 1 capsule (40 mg total) by mouth every morning. (Patient not taking: Reported on 01/31/2017)  . [DISCONTINUED] lisdexamfetamine (VYVANSE) 40 MG capsule Take 1 capsule (40 mg total) by mouth every morning. (Patient not taking: Reported on 01/31/2017)   No facility-administered encounter medications on file as of 01/31/2017.     Past Psychiatric History/Hospitalization(s): Anxiety: No Bipolar Disorder: Yes Depression: Yes Mania: No Psychosis: No Schizophrenia: No Personality Disorder: No Hospitalization for psychiatric illness: Yes History of Electroconvulsive Shock Therapy: No Prior Suicide Attempts: No  Physical Exam: Constitutional:  BP 138/80 (BP Location: Right Arm, Patient Position: Sitting, Cuff  Size: Large)   Pulse 86   Ht 5\' 6"  (1.676 m)   Wt 199 lb 3.2 oz (90.4 kg)   SpO2 97%   BMI 32.15 kg/m   General Appearance alert oriented   Musculoskeletal: Strength & Muscle Tone: within normal limits Gait & Station: normal Patient leans: N/A  Psychiatric: Speech (describe rate, volume, coherence, spontaneity, and abnormalities if any): Normal in volume rate and tone spontaneous  Thought Process (describe rate, content, abstract reasoning, and computation): Organized, goal-directed, age-appropriate  Associations: Intact  Thoughts: normal  Mental Status: Orientation: oriented to person, place, time/date and situation Mood & Affect: Mood is Up and down, ok for the most part Attention Span & Concentration ok, but subjectively worse without Vyvanse  Lab Results:  No results found for this or any previous visit (from the past 8736 hour(s)). PCP draws routine labs and nothing is emerging as of concern.  Assessment: Axis I:  Mood disorder NOS, ADHD combined type    Axis II: Deferred  Axis III: Wears glasses, acne,   Axis IV: Moderate  Axis V: 65  Plan: I took his vitals.  I reviewed CC, tobacco/med/surg Hx, meds effects/ side effects, problem list, therapies and responses as well as current situation/symptoms discussed options. He will continue Vyvanse  40 mg every morning ADHD symptoms.  We'll continue Lexapro 20 mg daily He'll return in 3 months See orders and pt instructions for more details.  MEDICATIONS this encounter: Meds ordered this encounter  Medications  . escitalopram (LEXAPRO) 20 MG tablet    Sig: Take 1 tablet (20 mg total) by mouth daily.    Dispense:  30 tablet    Refill:  2  . lisdexamfetamine (VYVANSE) 40 MG capsule    Sig: Take 1 capsule (40 mg total) by mouth every morning.    Dispense:  30 capsule    Refill:  0  . lisdexamfetamine (VYVANSE) 40 MG capsule    Sig: Take 1 capsule (40 mg total) by mouth every morning.    Dispense:  30 capsule    Refill:  0    Fill after 04/02/17  . lisdexamfetamine (VYVANSE) 40 MG capsule    Sig: Take 1 capsule (40 mg total) by mouth every morning.    Dispense:  30 capsule    Refill:  0    Fill after 03/02/17   Medical Decision Making Problem Points:  Established problem, stable/improving (1), Review of last therapy session (1), Review of psycho-social stressors (1) and Self-limited or minor (1) Data Points:  Review or order clinical lab tests (1) Review of medication regiment & side effects (2)  I certify that outpatient services furnished can reasonably be expected to improve the patient's condition.   Levonne Spiller, MD

## 2017-02-18 ENCOUNTER — Ambulatory Visit (INDEPENDENT_AMBULATORY_CARE_PROVIDER_SITE_OTHER): Payer: Medicaid Other | Admitting: Family Medicine

## 2017-02-18 ENCOUNTER — Encounter: Payer: Self-pay | Admitting: Family Medicine

## 2017-02-18 VITALS — BP 140/90 | Ht 66.0 in | Wt 196.0 lb

## 2017-02-18 DIAGNOSIS — Z113 Encounter for screening for infections with a predominantly sexual mode of transmission: Secondary | ICD-10-CM

## 2017-02-18 DIAGNOSIS — R350 Frequency of micturition: Secondary | ICD-10-CM | POA: Diagnosis not present

## 2017-02-18 NOTE — Progress Notes (Signed)
   Subjective:    Patient ID: [name], male    DOB: 1991-05-22, 26 y.o.   MRN: [id number]  HPI  Patient in today to for an STD check. Patient states that he does not feel he has anything but he wants to be seen.  Patient relates having one partner but he once make sure he does not have any STDs from his previous partner denies any other particular troubles Review of Systems    see above Objective:   Physical Exam Lungs clear heart regular GU normal Urine negative      Assessment & Plan:  STD workup initiated await the results

## 2017-02-19 LAB — RPR: RPR Ser Ql: NONREACTIVE

## 2017-02-19 LAB — HIV ANTIBODY (ROUTINE TESTING W REFLEX): HIV SCREEN 4TH GENERATION: NONREACTIVE

## 2017-02-22 LAB — GC/CHLAMYDIA PROBE AMP
CHLAMYDIA, DNA PROBE: NEGATIVE
NEISSERIA GONORRHOEAE BY PCR: NEGATIVE

## 2017-03-05 ENCOUNTER — Ambulatory Visit (INDEPENDENT_AMBULATORY_CARE_PROVIDER_SITE_OTHER): Payer: Medicaid Other | Admitting: Licensed Clinical Social Worker

## 2017-03-05 DIAGNOSIS — F39 Unspecified mood [affective] disorder: Secondary | ICD-10-CM

## 2017-03-05 DIAGNOSIS — F902 Attention-deficit hyperactivity disorder, combined type: Secondary | ICD-10-CM

## 2017-03-07 ENCOUNTER — Encounter (HOSPITAL_COMMUNITY): Payer: Self-pay | Admitting: Licensed Clinical Social Worker

## 2017-03-07 NOTE — Progress Notes (Signed)
   THERAPIST PROGRESS NOTE  Session Time: 1:10-2pm  Participation Level: Active  Behavioral Response: NeatAlertEuthymic  Type of Therapy: Individual Therapy  Treatment Goals addressed: Anger and Anxiety  Interventions: CBT and Psychosocial Skills  Summary: CHISUM HABENICHT is a 26 y.o. male who presents with an unspecified episodic mood disorder. Patient reports improved mood and communication with his mother and girlfriend. Pt was upbeat and wanted to discuss the areas of progress he has made. He reports his mother has been approved for longterm disability and he has been able to pay his bills on time. He reports he has used some of the money to pay for a supply of body jewelry which he bought from a friend. 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 Pt states his relationship with his gf cont to improve. They have been arguing less and going on more dates.   Suicidal/Homicidal: Nowithout intent/plan  Therapist Response: Counselor continued using CBT, open questions, and reflection to encourage pt insight.   Plan: Return again in 4 weeks.  Diagnosis:    ICD-10-CM   1. Unspecified mood (affective) disorder (HCC) F39   2. ADHD (attention deficit hyperactivity disorder), combined type F90.2      Wes Scottlynn Lindell LPCA LCASA 8:52 03/07/17

## 2017-03-29 ENCOUNTER — Ambulatory Visit (INDEPENDENT_AMBULATORY_CARE_PROVIDER_SITE_OTHER): Payer: Medicaid Other | Admitting: Licensed Clinical Social Worker

## 2017-03-29 ENCOUNTER — Encounter (HOSPITAL_COMMUNITY): Payer: Self-pay | Admitting: Licensed Clinical Social Worker

## 2017-03-29 DIAGNOSIS — F902 Attention-deficit hyperactivity disorder, combined type: Secondary | ICD-10-CM

## 2017-03-29 DIAGNOSIS — F39 Unspecified mood [affective] disorder: Secondary | ICD-10-CM | POA: Diagnosis not present

## 2017-03-29 NOTE — Progress Notes (Signed)
   THERAPIST PROGRESS NOTE  Session Time: 11:05-12  Participation Level: Active  Behavioral Response: Casual and NeatAlertEuthymic  Type of Therapy: Individual Therapy  Treatment Goals addressed: Communication: w/ longtime GF  Interventions: CBT and Psychosocial Skills: Commuincation Skills  Summary: [name] is a 26 y.o. male who presents with mixed anxiety and depressive symptoms related to unspecified mood disorder. Pt reports things have been "steady and ok" the last month but does endorse more frequent arguing w/ his longtime GF. When asked about the arguments pt says his GF does not accept his children since they are from pt's previous marriage. Counselor and pt discuss communication strategies including setting boundaries, saying "no", and saying "what you mean".    Suicidal/Homicidal: Nowithout intent/plan  Therapist Response: Counselor used open questions and active listening to help pt gain insight into his communication and relationship problems.  Plan: Return again in 4 weeks.  Diagnosis:    ICD-10-CM   1. Unspecified mood (affective) disorder (HCC) F39   2. ADHD (attention deficit hyperactivity disorder), combined type F90.2        [name], LCAS-A 03/29/2017

## 2017-05-02 ENCOUNTER — Encounter (HOSPITAL_COMMUNITY): Payer: Self-pay | Admitting: Psychiatry

## 2017-05-02 ENCOUNTER — Ambulatory Visit (INDEPENDENT_AMBULATORY_CARE_PROVIDER_SITE_OTHER): Payer: Medicaid Other | Admitting: Psychiatry

## 2017-05-02 VITALS — BP 126/75 | HR 96 | Ht 66.0 in | Wt 200.4 lb

## 2017-05-02 DIAGNOSIS — F902 Attention-deficit hyperactivity disorder, combined type: Secondary | ICD-10-CM

## 2017-05-02 DIAGNOSIS — Z818 Family history of other mental and behavioral disorders: Secondary | ICD-10-CM | POA: Diagnosis not present

## 2017-05-02 DIAGNOSIS — Z813 Family history of other psychoactive substance abuse and dependence: Secondary | ICD-10-CM

## 2017-05-02 DIAGNOSIS — Z811 Family history of alcohol abuse and dependence: Secondary | ICD-10-CM | POA: Diagnosis not present

## 2017-05-02 DIAGNOSIS — F39 Unspecified mood [affective] disorder: Secondary | ICD-10-CM | POA: Diagnosis not present

## 2017-05-02 MED ORDER — LISDEXAMFETAMINE DIMESYLATE 40 MG PO CAPS: 40.0000 mg | ORAL_CAPSULE | ORAL | 0 refills | Status: DC

## 2017-05-02 NOTE — Progress Notes (Signed)
Patient ID: [name], Stokes   DOB: 1990-09-04, 26 y.o.   MRN: [id number] Patient ID: [name], Stokes   DOB: Mar 21, 1991, 26 y.o.   MRN: [id number] Patient ID: [name], Stokes   DOB: 31-Jul-1991, 26 y.o.   MRN: [id number] Patient ID: [name], Stokes   DOB: 1990-11-02, 26 y.o.   MRN: [id number] Patient ID: [name], Stokes   DOB: [birth year], 26 y.o.   MRN: [id number] Patient ID: [name], Stokes   DOB: 17-Nov-1990, 26 y.o.   MRN: [id number] Patient ID: LEEVON Stokes, Stokes   DOB: 1991/02/22, 26 y.o.   MRN: [id number] Patient ID: [name], Stokes   DOB: [birth year], 26 y.o.   MRN: [id number] Patient ID: [name], Stokes   DOB: 01/01/1991, 26 y.o.   MRN: [id number] Patient ID: [name], Stokes   DOB: 04-30-1991, 26 y.o.   MRN: [id number] Patient ID: [name], Stokes   DOB: 1990/11/04, 26 y.o.   MRN: [id number] Patient ID: [name], Stokes   DOB: 1991-04-13, 26 y.o.   MRN: [id number] Patient ID: [name], Stokes   DOB: Nov 18, 1990, 26 y.o.   MRN: [id number] Patient ID: [name], Stokes   DOB: 1991-06-10, 26 y.o.   MRN: [id number] Patient ID: [name], Stokes   DOB: 25-Apr-1991, 26 y.o.   MRN: [id number] Patient ID: [name], Stokes   DOB: 1991/04/16, 26 y.o.   MRN: [id number] [facility] [address] Progress Note [name] MRN: [id number] DOB: [birth year] Age: 26 y.o.  Date: 05/02/2017 Start Time: 2:55 PM End Time: 3:08 PM  Chief Complaint: Chief Complaint  Patient presents with  . ADHD  . Follow-up  . Depression    Subjective:"I'm divorced now  This patient is a 26 year old [name] who has been living with his Children in [place] He is trying to reestablish disability   His mother reports that he was always a very hyperactive child wouldn't listen and acted out. He was constantly in trouble in school. He started on Ritalin at an early age but it made him like a zombie and he lost a  lot of weight. Over the years she's been on numerous stimulants. At age 2 his father took custody of him. He did not do well with his father and was very oppositional. He was eventually placed in a group home and went through 3 group homes to his teen years.  At age 26 he was arrested for continued contributing to do the deliquency of a minor. He was dating a 24 year old girl who ran away from home and was found with him he spent about 40 days in jail but he claims "turn my life around." Shortly after getting out of jail his father got ill with cancer and he spent a lot of time with him and was glad that they could reunite. The patient states he's currently doing well. He's under good control of his behavior. He's not particularly impulsive and is stable to stay focused. His mother states that he can't work full-time because of some onset something that upsets him he becomes very depressed. For now however he sleeping and eating well and seems to be happy with his wife He's had no further legal charges.  Patient returns after 3 months. He states that he is doing okay. He is "exhausted" from taking care of his children ages 22 and 71. He states that he doesn't always sleep  very well but he doesn't want to take sleeping pills. I suggested he try melatonin if needed. He's not taking Lexapro and doesn't want to take medicines for depression. His mood is generally good. The Vyvanse continues to help him focus  History of Present Illness: He has been on Ritalin, Concerta, Focalin.  He has also been on Depakote, Abilify as well as Strattera. Allergies: Allergies  Allergen Reactions  . Strattera [Atomoxetine Hcl] Other (See Comments)    Became violent on Strattera.  . Bactrim [Sulfamethoxazole-Trimethoprim] Rash  . Sulfonamide Derivatives Rash   Medical History: Past Medical History:  Diagnosis Date  . Acne   . ADHD (attention deficit hyperactivity disorder)   . Obsessive-compulsive disorder   .  Oppositional defiant disorder   . Unspecified episodic mood disorder    Surgical History: Past Surgical History:  Procedure Laterality Date  . TENDON LENGTHENING     leg tendon stretch as child  . TENDON LENGTHENING     age 7  . TOOTH EXTRACTION     Family History: family history includes Alcohol abuse in his paternal aunt; Bipolar disorder in his cousin; Drug abuse in his paternal aunt. Reviewed today in the office and nothing has changed.  Suicidal Ideation: No Plan Formed: No Patient has means to carry out plan: No  Homicidal Ideation: No Plan Formed: No Patient has means to carry out plan: No  Review of Systems: Psychiatric: Agitation: No Hallucination: No Depressed Mood: yes Insomnia: No Hypersomnia: No Altered Concentration: yes Feels Worthless: No Grandiose Ideas: No Belief In Special Powers: No New/Increased Substance Abuse: No Compulsions: No  Neurologic: Headache: No Seizure: No Paresthesias: No    Family History family history includes Alcohol abuse in his paternal aunt; Bipolar disorder in his cousin; Drug abuse in his paternal aunt.  Medications: Outpatient Encounter Prescriptions as of 05/02/2017  Medication Sig  . albuterol (PROVENTIL HFA;VENTOLIN HFA) 108 (90 Base) MCG/ACT inhaler Inhale 2 puffs into the lungs every 6 (six) hours as needed for wheezing.  Marland Kitchen ibuprofen (ADVIL,MOTRIN) 600 MG tablet TAKE (1) TABLET EVERY EIGHT HOURS AS NEEDED.  Marland Kitchen lisdexamfetamine (VYVANSE) 40 MG capsule Take 1 capsule (40 mg total) by mouth every morning.  . loratadine (CLARITIN) 10 MG tablet Take 1 tablet (10 mg total) by mouth daily.  . [DISCONTINUED] escitalopram (LEXAPRO) 20 MG tablet Take 1 tablet (20 mg total) by mouth daily.  . [DISCONTINUED] lisdexamfetamine (VYVANSE) 40 MG capsule Take 1 capsule (40 mg total) by mouth every morning.  . lisdexamfetamine (VYVANSE) 40 MG capsule Take 1 capsule (40 mg total) by mouth every morning.  . lisdexamfetamine (VYVANSE) 40  MG capsule Take 1 capsule (40 mg total) by mouth every morning.  . [DISCONTINUED] lisdexamfetamine (VYVANSE) 40 MG capsule Take 1 capsule (40 mg total) by mouth every morning. (Patient not taking: Reported on 05/02/2017)  . [DISCONTINUED] lisdexamfetamine (VYVANSE) 40 MG capsule Take 1 capsule (40 mg total) by mouth every morning. (Patient not taking: Reported on 05/02/2017)   No facility-administered encounter medications on file as of 05/02/2017.     Past Psychiatric History/Hospitalization(s): Anxiety: No Bipolar Disorder: Yes Depression: Yes Mania: No Psychosis: No Schizophrenia: No Personality Disorder: No Hospitalization for psychiatric illness: Yes History of Electroconvulsive Shock Therapy: No Prior Suicide Attempts: No  Physical Exam: Constitutional:  BP 126/75   Pulse 96   Ht 5\' 6"  (1.676 m)   Wt 200 lb 6.4 oz (90.9 kg)   BMI 32.35 kg/m   General Appearance alert oriented   Musculoskeletal:  Strength & Muscle Tone: within normal limits Gait & Station: normal Patient leans: N/A  Psychiatric: Speech (describe rate, volume, coherence, spontaneity, and abnormalities if any): Normal in volume rate and tone spontaneous  Thought Process (describe rate, content, abstract reasoning, and computation): Organized, goal-directed, age-appropriate  Associations: Intact  Thoughts: normal  Mental Status: Orientation: oriented to person, place, time/date and situation Mood & Affect: Mood is Good, ok for the most part Attention Span & Concentration ok, but subjectively worse without Vyvanse  Lab Results:  Results for orders placed or performed in visit on 02/18/17 (from the past 8736 hour(s))  GC/Chlamydia Probe Amp   Collection Time: 02/18/17 12:00 AM  Result Value Ref Range   Chlamydia trachomatis, NAA Negative Negative   Neisseria gonorrhoeae by PCR Negative Negative  HIV antibody (with reflex)   Collection Time: 02/18/17 11:50 AM  Result Value Ref Range   HIV Screen  4th Generation wRfx Non Reactive Non Reactive  RPR   Collection Time: 02/18/17 11:50 AM  Result Value Ref Range   RPR Ser Ql Non Reactive Non Reactive   PCP draws routine labs and nothing is emerging as of concern.  Assessment: Axis I: Mood disorder NOS, ADHD combined type    Axis II: Deferred  Axis III: Wears glasses, acne,   Axis IV: Moderate  Axis V: 65  Plan: I took his vitals.  I reviewed CC, tobacco/med/surg Hx, meds effects/ side effects, problem list, therapies and responses as well as current situation/symptoms discussed options. He will continue Vyvanse  40 mg every morning for ADHD symptoms. He'll return in 3 months See orders and pt instructions for more details.  MEDICATIONS this encounter: Meds ordered this encounter  Medications  . lisdexamfetamine (VYVANSE) 40 MG capsule    Sig: Take 1 capsule (40 mg total) by mouth every morning.    Dispense:  30 capsule    Refill:  0    Fill after 06/02/17  . lisdexamfetamine (VYVANSE) 40 MG capsule    Sig: Take 1 capsule (40 mg total) by mouth every morning.    Dispense:  30 capsule    Refill:  0    Fill after 07/03/17  . lisdexamfetamine (VYVANSE) 40 MG capsule    Sig: Take 1 capsule (40 mg total) by mouth every morning.    Dispense:  30 capsule    Refill:  0   Medical Decision Making Problem Points:  Established problem, stable/improving (1), Review of last therapy session (1), Review of psycho-social stressors (1) and Self-limited or minor (1) Data Points:  Review or order clinical lab tests (1) Review of medication regiment & side effects (2)  I certify that outpatient services furnished can reasonably be expected to improve the patient's condition.   Levonne Spiller, MD

## 2017-05-03 ENCOUNTER — Ambulatory Visit (INDEPENDENT_AMBULATORY_CARE_PROVIDER_SITE_OTHER): Payer: Medicaid Other | Admitting: Licensed Clinical Social Worker

## 2017-05-03 ENCOUNTER — Encounter (HOSPITAL_COMMUNITY): Payer: Self-pay | Admitting: Licensed Clinical Social Worker

## 2017-05-03 DIAGNOSIS — F39 Unspecified mood [affective] disorder: Secondary | ICD-10-CM

## 2017-05-03 DIAGNOSIS — F902 Attention-deficit hyperactivity disorder, combined type: Secondary | ICD-10-CM | POA: Diagnosis not present

## 2017-05-03 NOTE — Progress Notes (Signed)
   THERAPIST PROGRESS NOTE  Session Time: 11-12  Participation Level: Active  Behavioral Response: Casual and Well GroomedAlertDepressed and Irritable  Type of Therapy: Individual Therapy  Treatment Goals addressed: Anxiety and Communication: w/ partners  Interventions: CBT, Strength-based and Supportive  Summary: [name] is a 26 y.o. male who presents with recent stressors from his girlfriend who says that she is "tired of doing things his way". When asked what that means pt admits that he feels his gf is pulling away and does not want to be with him. Counselor and pt discussed pt's relationship goals, needs, and how he can change his bx to increase his effectiveness w/ his gf.    Suicidal/Homicidal: Nowithout intent/plan  Therapist Response: Counselor used open supportive questions and emotion focusing techniques to help pt identify his anger towards his gf and the sadness that underlies it.   Plan: Return again in 4 weeks.  Diagnosis:    ICD-10-CM   1. Unspecified mood (affective) disorder (HCC) F39   2. ADHD (attention deficit hyperactivity disorder), combined type F90.2        [name], LCAS-A 05/03/2017

## 2017-05-24 ENCOUNTER — Ambulatory Visit (INDEPENDENT_AMBULATORY_CARE_PROVIDER_SITE_OTHER): Payer: Medicaid Other | Admitting: Licensed Clinical Social Worker

## 2017-05-24 ENCOUNTER — Encounter (HOSPITAL_COMMUNITY): Payer: Self-pay | Admitting: Licensed Clinical Social Worker

## 2017-05-24 DIAGNOSIS — F902 Attention-deficit hyperactivity disorder, combined type: Secondary | ICD-10-CM | POA: Diagnosis not present

## 2017-05-24 DIAGNOSIS — F39 Unspecified mood [affective] disorder: Secondary | ICD-10-CM | POA: Diagnosis not present

## 2017-05-24 NOTE — Progress Notes (Signed)
   THERAPIST PROGRESS NOTE  Session Time: 11-12  Participation Level: Active  Behavioral Response: Bizarre and CasualAlertEuthymic  Type of Therapy: Individual Therapy  Treatment Goals addressed: Anxiety  Interventions: CBT, Strength-based and Psychosocial Skills: Communication skills, parenting skills  Summary: [name] is a 26 y.o. male who presents with stress, tearfulness, and mixed anxiety related to his romantic relationship and fatherhood. He reports he has been feeling "up and down" the last few weeks and had a huge fight w/ his girlfriend over issues of controlling the relationship and whether or not she would accept his children from his ex wife.   Pt reads a text messaged conversation he had w/ his girlfriend last night in which they agreed to "start listening to each other more and letting each other feel the way they feel about things". Counselor helped pt grow insight into his communication skills and how he can better get what he wants out of the relationship.   Counselor and pt discussed pt's children and their day-to-day care from pt's mother who is now in counseling in this office. Pt denies any worries or doubts about his children's well-being and reports that his mother is "growing closer to the children".  Counselor encourages pt to describe his past therapeutic relationship w/ Dr. Sima Matas and how pt can better achieve what his is looking for in this current therapy.  Suicidal/Homicidal: Nowithout intent/plan  Therapist Response: Counselor used open questions, genuine positive regard, and reflective listening to help pt grow in his insight of his presentation, and communication skills.  Plan: Return again in 4 weeks.  Diagnosis:    ICD-10-CM   1. Unspecified mood (affective) disorder (HCC) F39   2. ADHD (attention deficit hyperactivity disorder), combined type F90.2        [name], LCAS-A 05/24/2017

## 2017-06-07 ENCOUNTER — Ambulatory Visit (HOSPITAL_COMMUNITY): Payer: Self-pay | Admitting: Licensed Clinical Social Worker

## 2017-06-13 ENCOUNTER — Ambulatory Visit (INDEPENDENT_AMBULATORY_CARE_PROVIDER_SITE_OTHER): Payer: Medicaid Other | Admitting: Family Medicine

## 2017-06-13 DIAGNOSIS — M79641 Pain in right hand: Secondary | ICD-10-CM | POA: Diagnosis not present

## 2017-06-13 NOTE — Progress Notes (Signed)
15 minutes was spent with the patient today evaluating he relates he is having pain and discomfort in the right hand that radiates up his finger he describes as a very tender sharp pain whenever he tries to grip her left this is been going on for weeks and he has been having this problem ever since he had a laceration to his hand earlier this year on a dog food can he has permanent numbness along the medial aspect of the index finger.  He denies any other troubles currently no redness no drainage no pus no fever or chills on physical exam pulses in his hands are good capillary refill good.  He has a scar tissue with some tenderness along the left tablet he has good range of motion of the fingers. The possible nerve damage versus scar tissue causing soreness and pain at the site of the cut Patient requested to be seen by hand specialist we will set him up in [facility]

## 2017-06-14 NOTE — Addendum Note (Signed)
Addended by: Dairl Ponder on: 06/14/2017 10:29 AM   Modules accepted: Orders

## 2017-06-14 NOTE — Progress Notes (Signed)
Referral ordered in EPIC. 

## 2017-06-20 ENCOUNTER — Encounter: Payer: Self-pay | Admitting: Family Medicine

## 2017-06-20 DIAGNOSIS — D3612 Benign neoplasm of peripheral nerves and autonomic nervous system, upper limb, including shoulder: Secondary | ICD-10-CM

## 2017-06-20 HISTORY — DX: Benign neoplasm of peripheral nerves and autonomic nervous system, upper limb, including shoulder: D36.12

## 2017-06-21 ENCOUNTER — Encounter (HOSPITAL_COMMUNITY): Payer: Self-pay | Admitting: Licensed Clinical Social Worker

## 2017-06-21 ENCOUNTER — Ambulatory Visit (INDEPENDENT_AMBULATORY_CARE_PROVIDER_SITE_OTHER): Payer: Medicaid Other | Admitting: Licensed Clinical Social Worker

## 2017-06-21 DIAGNOSIS — F39 Unspecified mood [affective] disorder: Secondary | ICD-10-CM | POA: Diagnosis not present

## 2017-06-21 DIAGNOSIS — F902 Attention-deficit hyperactivity disorder, combined type: Secondary | ICD-10-CM

## 2017-06-21 DIAGNOSIS — F429 Obsessive-compulsive disorder, unspecified: Secondary | ICD-10-CM | POA: Diagnosis not present

## 2017-06-21 NOTE — Progress Notes (Signed)
   THERAPIST PROGRESS NOTE  Session Time: 11-12  Participation Level: Active  Behavioral Response: Casual and Fairly GroomedAlertDepressed  Type of Therapy: Individual Therapy  Treatment Goals addressed: Coping and Diagnosis: Mood Disorder  Interventions: CBT, Motivational Interviewing and Supportive  Summary: VISENTE KIRKER is a 26 y.o. male who presents with mixed anxiety and depressive symptoms related to his mood and codependency behaviors. He presents as somewhat withdrawn and more depressed than previous sessions, stating "things w/ gf Estill Bamberg aren't going well". When asked, pt describes a relationship in which his gf will not commit to being what he needs fully and instead "strings him along w/ empty promises". Pt states he is "beginning to get fed up" and admits he does not know "how much longer he can tolerate her indecisiveness".   Pt admits he is enjoying his job at [employer] and feels "peaceful and content" when he is at work. He admits this makes him feel guilty since he is "not w/ his kids or Estill Bamberg". Counselor spent time challenging pt's inability to spend time by himself, for himself. Pt reported he texts his gf throughout the day "nearly all day" and admits it is difficult to be cut off from communication from her for more than 15 mins. Counselor and pt discuss attributes of healthy relationships. Pt is unable to qualify healthy relationships and admits he "does not know how to have healthy relationships".   Suicidal/Homicidal: Nowithout intent/plan  Therapist Response: Counselor used open questions and empathic reflection to help pt gain insight into his slight tearfulness when discussing his hurt from his gr and the guilt he feels for enjoying his job/time alone. Counselor encouraged pt to consider what behavioral steps he wants to take towards achieving the kind of relationship he wants w/ his gf.   Plan: Return again in 4 weeks.  Diagnosis:    ICD-10-CM   1.  Unspecified mood (affective) disorder (HCC) F39   2. ADHD (attention deficit hyperactivity disorder), combined type F90.2   3. Obsessive-compulsive disorder, unspecified type F42.9       [name], LCAS-A 06/21/2017

## 2017-06-24 ENCOUNTER — Ambulatory Visit (INDEPENDENT_AMBULATORY_CARE_PROVIDER_SITE_OTHER): Payer: Medicaid Other | Admitting: Orthopaedic Surgery

## 2017-06-24 ENCOUNTER — Encounter (INDEPENDENT_AMBULATORY_CARE_PROVIDER_SITE_OTHER): Payer: Self-pay | Admitting: Orthopaedic Surgery

## 2017-06-24 DIAGNOSIS — D3612 Benign neoplasm of peripheral nerves and autonomic nervous system, upper limb, including shoulder: Secondary | ICD-10-CM

## 2017-06-24 NOTE — Progress Notes (Signed)
Office Visit Note   Patient: [name]           Date of Birth: 1991-07-30           MRN: [id number] Visit Date: 06/24/2017              Requested by: Kathyrn Drown, MD [address] [place], [place] [address] PCP: Kathyrn Drown, MD   Assessment & Plan: Visit Diagnoses:  1. 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  Patient would like to have this surgically resected.  We did discuss conservative treatment in terms of injection but I do not think that this would give him any prolonged relief.  He understands risk benefits alternatives to surgery including recurrence of the neuroma or incomplete relief of pain.  He understands and wishes to proceed.  Follow-Up Instructions: Return for 2 week postop visit.   Orders:  No orders of the defined types were placed in this encounter.  No orders of the defined types were placed in this encounter.     Procedures: No procedures performed   Clinical Data: No additional findings.   Subjective: Chief Complaint  Patient presents with  . Right Shoulder - Pain  . Left Shoulder - Pain    Patient is a 26 year old gentleman comes in with a painful right hand mass status post traumatic laceration from several years ago.  He has a palpable tender mass that causes pain to radiate up into her forearm.  He is not able to fully feel with the radial aspect of his index finger.  He has no motor deficits.  Denies any recent injuries.    Review of Systems  Constitutional: Negative.   All other systems reviewed and are negative.    Objective: Vital Signs: There were no vitals taken for this visit.  Physical Exam  Constitutional: He is oriented to person, place, and time. He appears well-developed and well-nourished.  HENT:  Head: Normocephalic and atraumatic.  Eyes: Pupils are equal, round, and reactive to light.  Neck: Neck supple.  Pulmonary/Chest: Effort  normal.  Abdominal: Soft.  Musculoskeletal: Normal range of motion.  Neurological: He is alert and oriented to person, place, and time.  Skin: Skin is warm.  Psychiatric: He has a normal mood and affect. His behavior is normal. Judgment and thought content normal.  Nursing note and vitals reviewed.   Ortho Exam Right hand exam shows a traumatic laceration across the proximal palmar crease in line with the index finger.  He has no focal motor deficits.  He does have a tender mass in this area that has a positive Tinel's. Specialty Comments:  No specialty comments available.  Imaging: No results found.   PMFS History: Patient Active Problem List   Diagnosis Date Noted  . Neuroma of hand 06/24/2017  . Snoring 12/08/2015  . OCD (obsessive compulsive disorder) 08/06/2012  . Unspecified episodic mood disorder 07/18/2011  . ADHD (attention deficit hyperactivity disorder), combined type 07/18/2011  . 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 CLOSED FRACTURE METACARPAL BONE SITE UNSPECIFIED 11/24/2009   Past Medical History:  Diagnosis Date  . Acne   . ADHD (attention deficit hyperactivity disorder)   . Obsessive-compulsive disorder   . Oppositional defiant disorder   . Unspecified episodic mood disorder     Family History  Problem Relation Age of Onset  . Bipolar disorder Cousin   . Alcohol abuse Paternal Aunt   .  Drug abuse Paternal Aunt   . Anxiety disorder Neg Hx   . Dementia Neg Hx   . Paranoid behavior Neg Hx   . Physical abuse Neg Hx   . Schizophrenia Neg Hx   . Seizures Neg Hx   . Sexual abuse Neg Hx     Past Surgical History:  Procedure Laterality Date  . TENDON LENGTHENING     leg tendon stretch as child  . TENDON LENGTHENING     age 50  . TOOTH EXTRACTION     Social History   Occupational History  . Not on file  Tobacco Use  . Smoking status: Former Smoker    Packs/day: 0.04    Years: 3.00    Pack years: 0.12    Types: Cigarettes    Last attempt to  quit: 03/20/2013    Years since quitting: 4.2  . Smokeless tobacco: Never Used  . Tobacco comment: "I quit again"  Substance and Sexual Activity  . Alcohol use: Not on file  . Drug use: No  . Sexual activity: Yes    Partners: Female

## 2017-06-28 ENCOUNTER — Other Ambulatory Visit (INDEPENDENT_AMBULATORY_CARE_PROVIDER_SITE_OTHER): Payer: Self-pay | Admitting: Orthopaedic Surgery

## 2017-06-28 DIAGNOSIS — D3612 Benign neoplasm of peripheral nerves and autonomic nervous system, upper limb, including shoulder: Secondary | ICD-10-CM

## 2017-07-01 ENCOUNTER — Other Ambulatory Visit: Payer: Self-pay

## 2017-07-01 ENCOUNTER — Encounter (HOSPITAL_BASED_OUTPATIENT_CLINIC_OR_DEPARTMENT_OTHER): Payer: Self-pay | Admitting: *Deleted

## 2017-07-01 DIAGNOSIS — R0981 Nasal congestion: Secondary | ICD-10-CM

## 2017-07-01 HISTORY — DX: Nasal congestion: R09.81

## 2017-07-03 ENCOUNTER — Ambulatory Visit (HOSPITAL_BASED_OUTPATIENT_CLINIC_OR_DEPARTMENT_OTHER): Payer: Medicaid Other | Admitting: Anesthesiology

## 2017-07-03 ENCOUNTER — Encounter (HOSPITAL_BASED_OUTPATIENT_CLINIC_OR_DEPARTMENT_OTHER): Payer: Self-pay | Admitting: Certified Registered Nurse Anesthetist

## 2017-07-03 ENCOUNTER — Ambulatory Visit (HOSPITAL_BASED_OUTPATIENT_CLINIC_OR_DEPARTMENT_OTHER)
Admission: RE | Admit: 2017-07-03 | Discharge: 2017-07-03 | Disposition: A | Discharge status: Home / Self Care | Payer: Medicaid Other | Source: Ambulatory Visit | Attending: Orthopaedic Surgery | Admitting: Orthopaedic Surgery

## 2017-07-03 ENCOUNTER — Encounter (HOSPITAL_BASED_OUTPATIENT_CLINIC_OR_DEPARTMENT_OTHER)
Admission: RE | Disposition: A | Discharge status: Home / Self Care | Payer: Self-pay | Source: Ambulatory Visit | Attending: Orthopaedic Surgery

## 2017-07-03 DIAGNOSIS — F429 Obsessive-compulsive disorder, unspecified: Secondary | ICD-10-CM | POA: Insufficient documentation

## 2017-07-03 DIAGNOSIS — G43909 Migraine, unspecified, not intractable, without status migrainosus: Secondary | ICD-10-CM | POA: Insufficient documentation

## 2017-07-03 DIAGNOSIS — Z79899 Other long term (current) drug therapy: Secondary | ICD-10-CM | POA: Diagnosis not present

## 2017-07-03 DIAGNOSIS — F909 Attention-deficit hyperactivity disorder, unspecified type: Secondary | ICD-10-CM | POA: Diagnosis not present

## 2017-07-03 DIAGNOSIS — D3610 Benign neoplasm of peripheral nerves and autonomic nervous system, unspecified: Secondary | ICD-10-CM | POA: Diagnosis present

## 2017-07-03 DIAGNOSIS — Z87891 Personal history of nicotine dependence: Secondary | ICD-10-CM | POA: Insufficient documentation

## 2017-07-03 DIAGNOSIS — D3612 Benign neoplasm of peripheral nerves and autonomic nervous system, upper limb, including shoulder: Secondary | ICD-10-CM | POA: Diagnosis not present

## 2017-07-03 DIAGNOSIS — F913 Oppositional defiant disorder: Secondary | ICD-10-CM | POA: Diagnosis not present

## 2017-07-03 DIAGNOSIS — F4310 Post-traumatic stress disorder, unspecified: Secondary | ICD-10-CM | POA: Diagnosis not present

## 2017-07-03 DIAGNOSIS — J45909 Unspecified asthma, uncomplicated: Secondary | ICD-10-CM | POA: Insufficient documentation

## 2017-07-03 DIAGNOSIS — F329 Major depressive disorder, single episode, unspecified: Secondary | ICD-10-CM | POA: Insufficient documentation

## 2017-07-03 HISTORY — DX: Migraine, unspecified, not intractable, without status migrainosus: G43.909

## 2017-07-03 HISTORY — DX: Unspecified asthma, uncomplicated: J45.909

## 2017-07-03 HISTORY — DX: Benign neoplasm of peripheral nerves and autonomic nervous system, upper limb, including shoulder: D36.12

## 2017-07-03 HISTORY — PX: GANGLION CYST EXCISION: SHX1691

## 2017-07-03 HISTORY — DX: Depression, unspecified: F32.A

## 2017-07-03 HISTORY — DX: Obsessive-compulsive disorder, unspecified: F42.9

## 2017-07-03 HISTORY — DX: Nasal congestion: R09.81

## 2017-07-03 HISTORY — DX: Post-traumatic stress disorder, unspecified: F43.10

## 2017-07-03 HISTORY — DX: Major depressive disorder, single episode, unspecified: F32.9

## 2017-07-03 SURGERY — EXCISION, GANGLION CYST, WRIST
Anesthesia: General | Site: Hand | Laterality: Right

## 2017-07-03 MED ORDER — MIDAZOLAM HCL 5 MG/5ML IJ SOLN
INTRAMUSCULAR | Status: DC | PRN
  Administered 2017-07-03: 2 mg via INTRAVENOUS

## 2017-07-03 MED ORDER — OXYCODONE HCL 5 MG/5ML PO SOLN: 5.0000 mg | Freq: Once | ORAL | Status: DC | PRN

## 2017-07-03 MED ORDER — SCOPOLAMINE 1 MG/3DAYS TD PT72: 1.0000 | MEDICATED_PATCH | Freq: Once | TRANSDERMAL | Status: DC | PRN

## 2017-07-03 MED ORDER — DEXAMETHASONE SODIUM PHOSPHATE 10 MG/ML IJ SOLN
INTRAMUSCULAR | Status: AC
  Filled 2017-07-03: qty 1

## 2017-07-03 MED ORDER — MIDAZOLAM HCL 2 MG/2ML IJ SOLN
INTRAMUSCULAR | Status: AC
  Filled 2017-07-03: qty 2

## 2017-07-03 MED ORDER — LIDOCAINE 2% (20 MG/ML) 5 ML SYRINGE
INTRAMUSCULAR | Status: DC | PRN
  Administered 2017-07-03: 100 mg via INTRAVENOUS

## 2017-07-03 MED ORDER — PROPOFOL 10 MG/ML IV BOLUS
INTRAVENOUS | Status: AC
  Filled 2017-07-03: qty 20

## 2017-07-03 MED ORDER — FENTANYL CITRATE (PF) 100 MCG/2ML IJ SOLN
INTRAMUSCULAR | Status: AC
  Filled 2017-07-03: qty 2

## 2017-07-03 MED ORDER — ONDANSETRON HCL 4 MG/2ML IJ SOLN
INTRAMUSCULAR | Status: AC
  Filled 2017-07-03: qty 2

## 2017-07-03 MED ORDER — FENTANYL CITRATE (PF) 100 MCG/2ML IJ SOLN: 50.0000 ug | INTRAMUSCULAR | Status: DC | PRN

## 2017-07-03 MED ORDER — BUPIVACAINE HCL (PF) 0.5 % IJ SOLN
INTRAMUSCULAR | Status: DC | PRN
  Administered 2017-07-03: 10 mL

## 2017-07-03 MED ORDER — FENTANYL CITRATE (PF) 100 MCG/2ML IJ SOLN
INTRAMUSCULAR | Status: DC | PRN
  Administered 2017-07-03 (×4): 25 ug via INTRAVENOUS

## 2017-07-03 MED ORDER — HYDROMORPHONE HCL 1 MG/ML IJ SOLN: 0.2500 mg | INTRAMUSCULAR | Status: DC | PRN

## 2017-07-03 MED ORDER — LIDOCAINE 2% (20 MG/ML) 5 ML SYRINGE
INTRAMUSCULAR | Status: AC
  Filled 2017-07-03: qty 5

## 2017-07-03 MED ORDER — IBUPROFEN 200 MG PO TABS
ORAL_TABLET | ORAL | Status: AC
  Filled 2017-07-03: qty 2

## 2017-07-03 MED ORDER — CEFAZOLIN SODIUM-DEXTROSE 2-4 GM/100ML-% IV SOLN
2.0000 g | INTRAVENOUS | Status: AC
  Administered 2017-07-03: 2 g via INTRAVENOUS

## 2017-07-03 MED ORDER — OXYCODONE HCL 5 MG PO TABS: 5.0000 mg | ORAL_TABLET | Freq: Once | ORAL | Status: DC | PRN

## 2017-07-03 MED ORDER — ONDANSETRON HCL 4 MG/2ML IJ SOLN
INTRAMUSCULAR | Status: DC | PRN
  Administered 2017-07-03: 4 mg via INTRAVENOUS

## 2017-07-03 MED ORDER — DEXAMETHASONE SODIUM PHOSPHATE 10 MG/ML IJ SOLN
INTRAMUSCULAR | Status: DC | PRN
  Administered 2017-07-03: 10 mg via INTRAVENOUS

## 2017-07-03 MED ORDER — IBUPROFEN 400 MG PO TABS
400.0000 mg | ORAL_TABLET | Freq: Once | ORAL | Status: AC
  Administered 2017-07-03: 400 mg via ORAL

## 2017-07-03 MED ORDER — CEFAZOLIN SODIUM-DEXTROSE 2-4 GM/100ML-% IV SOLN
INTRAVENOUS | Status: AC
  Filled 2017-07-03: qty 100

## 2017-07-03 MED ORDER — LACTATED RINGERS IV SOLN
INTRAVENOUS | Status: DC
  Administered 2017-07-03: 08:00:00 via INTRAVENOUS

## 2017-07-03 MED ORDER — BUPIVACAINE HCL (PF) 0.5 % IJ SOLN
INTRAMUSCULAR | Status: AC
  Filled 2017-07-03: qty 30

## 2017-07-03 MED ORDER — PROPOFOL 10 MG/ML IV BOLUS
INTRAVENOUS | Status: DC | PRN
  Administered 2017-07-03: 200 mg via INTRAVENOUS

## 2017-07-03 MED ORDER — MIDAZOLAM HCL 2 MG/2ML IJ SOLN: 1.0000 mg | INTRAMUSCULAR | Status: DC | PRN

## 2017-07-03 MED ORDER — LIDOCAINE HCL 2 % IJ SOLN
INTRAMUSCULAR | Status: AC
  Filled 2017-07-03: qty 20

## 2017-07-03 MED ORDER — HYDROCODONE-ACETAMINOPHEN 5-325 MG PO TABS: 1.0000 | ORAL_TABLET | Freq: Four times a day (QID) | ORAL | 0 refills | Status: DC | PRN

## 2017-07-03 SURGICAL SUPPLY — 52 items
ADH SKN CLS APL DERMABOND .7 (GAUZE/BANDAGES/DRESSINGS)
BLADE HEX COATED 2.75 (ELECTRODE) IMPLANT
BLADE SURG 15 STRL LF DISP TIS (BLADE) ×2 IMPLANT
BLADE SURG 15 STRL SS (BLADE) ×4
BNDG CMPR 9X4 STRL LF SNTH (GAUZE/BANDAGES/DRESSINGS) ×1
BNDG COHESIVE 4X5 TAN STRL (GAUZE/BANDAGES/DRESSINGS) IMPLANT
BNDG ESMARK 4X9 LF (GAUZE/BANDAGES/DRESSINGS) ×2 IMPLANT
BRUSH SCRUB EZ PLAIN DRY (MISCELLANEOUS) ×2 IMPLANT
CANISTER SUCT 1200ML W/VALVE (MISCELLANEOUS) IMPLANT
CORD BIPOLAR FORCEPS 12FT (ELECTRODE) IMPLANT
COVER BACK TABLE 60X90IN (DRAPES) ×2 IMPLANT
CUFF TOURNIQUET SINGLE 18IN (TOURNIQUET CUFF) ×2 IMPLANT
DECANTER SPIKE VIAL GLASS SM (MISCELLANEOUS) IMPLANT
DERMABOND ADVANCED (GAUZE/BANDAGES/DRESSINGS)
DERMABOND ADVANCED .7 DNX12 (GAUZE/BANDAGES/DRESSINGS) IMPLANT
DRAPE EXTREMITY T 121X128X90 (DRAPE) ×2 IMPLANT
DRAPE IMP U-DRAPE 54X76 (DRAPES) ×2 IMPLANT
DRAPE SURG 17X23 STRL (DRAPES) ×4 IMPLANT
DRSG EMULSION OIL 3X3 NADH (GAUZE/BANDAGES/DRESSINGS) ×2 IMPLANT
ELECT REM PT RETURN 9FT ADLT (ELECTROSURGICAL) ×2
ELECTRODE REM PT RTRN 9FT ADLT (ELECTROSURGICAL) ×1 IMPLANT
GAUZE SPONGE 4X4 12PLY STRL (GAUZE/BANDAGES/DRESSINGS) ×2 IMPLANT
GAUZE SPONGE 4X4 16PLY XRAY LF (GAUZE/BANDAGES/DRESSINGS) IMPLANT
GAUZE XEROFORM 1X8 LF (GAUZE/BANDAGES/DRESSINGS) ×2 IMPLANT
GLOVE SKINSENSE NS SZ7.5 (GLOVE) ×1
GLOVE SKINSENSE STRL SZ7.5 (GLOVE) ×1 IMPLANT
GLOVE SURG SYN 7.5  E (GLOVE) ×1
GLOVE SURG SYN 7.5 E (GLOVE) ×1 IMPLANT
GOWN STRL REIN XL XLG (GOWN DISPOSABLE) ×2 IMPLANT
GOWN STRL REUS W/ TWL LRG LVL3 (GOWN DISPOSABLE) ×1 IMPLANT
GOWN STRL REUS W/TWL LRG LVL3 (GOWN DISPOSABLE) ×2
NS IRRIG 1000ML POUR BTL (IV SOLUTION) IMPLANT
PACK BASIN DAY SURGERY FS (CUSTOM PROCEDURE TRAY) ×2 IMPLANT
PAD CAST 4YDX4 CTTN HI CHSV (CAST SUPPLIES) IMPLANT
PADDING CAST COTTON 4X4 STRL (CAST SUPPLIES)
PADDING CAST SYNTHETIC 4 (CAST SUPPLIES)
PADDING CAST SYNTHETIC 4X4 STR (CAST SUPPLIES) IMPLANT
PENCIL BUTTON HOLSTER BLD 10FT (ELECTRODE) IMPLANT
SPONGE LAP 18X18 X RAY DECT (DISPOSABLE) IMPLANT
STOCKINETTE 4X48 STRL (DRAPES) IMPLANT
SUCTION FRAZIER HANDLE 10FR (MISCELLANEOUS) ×1
SUCTION TUBE FRAZIER 10FR DISP (MISCELLANEOUS) ×1 IMPLANT
SUT ETHILON 2 0 FS 18 (SUTURE) IMPLANT
SUT ETHILON 4 0 PS 2 18 (SUTURE) IMPLANT
SUT VIC AB 0 CT1 27 (SUTURE) ×2
SUT VIC AB 0 CT1 27XBRD ANBCTR (SUTURE) ×1 IMPLANT
SUT VIC AB 2-0 CT1 27 (SUTURE)
SUT VIC AB 2-0 CT1 TAPERPNT 27 (SUTURE) IMPLANT
SYR BULB 3OZ (MISCELLANEOUS) IMPLANT
TOWEL OR 17X24 6PK STRL BLUE (TOWEL DISPOSABLE) ×2 IMPLANT
TOWEL OR NON WOVEN STRL DISP B (DISPOSABLE) ×2 IMPLANT
YANKAUER SUCT BULB TIP NO VENT (SUCTIONS) IMPLANT

## 2017-07-03 NOTE — Discharge Instructions (Signed)
Ibuprofen given at 9:30. Next dose at 3:30 if needed.    Postoperative instructions:  Weightbearing instructions: as tolerated.  No heavy lifting more than 10 lbs.  Keep your dressing and/or splint clean and dry at all times.  You can remove your dressing on post-operative day #3 and change with a dry/sterile dressing or Band-Aids as needed thereafter.    Incision instructions:  Do not soak your incision for 3 weeks after surgery.  If the incision gets wet, pat dry and do not scrub the incision.  Pain control:  You have been given a prescription to be taken as directed for post-operative pain control.  In addition, elevate the operative extremity above the heart at all times to prevent swelling and throbbing pain.  Take over-the-counter Colace, 100mg  by mouth twice a day while taking narcotic pain medications to help prevent constipation.  Follow up appointments: 1) 10-14 days for suture removal and wound check. 2) Dr. Erlinda Hong as scheduled.   -------------------------------------------------------------------------------------------------------------  After Surgery Pain Control:  After your surgery, post-surgical discomfort or pain is likely. This discomfort can last several days to a few weeks. At certain times of the day your discomfort may be more intense.  Did you receive a nerve block?  A nerve block can provide pain relief for one hour to two days after your surgery. As long as the nerve block is working, you will experience little or no sensation in the area the surgeon operated on.  As the nerve block wears off, you will begin to experience pain or discomfort. It is very important that you begin taking your prescribed pain medication before the nerve block fully wears off. Treating your pain at the first sign of the block wearing off will ensure your pain is better controlled and more tolerable when full-sensation returns. Do not wait until the pain is intolerable, as the medicine will  be less effective. It is better to treat pain in advance than to try and catch up.  General Anesthesia:  If you did not receive a nerve block during your surgery, you will need to start taking your pain medication shortly after your surgery and should continue to do so as prescribed by your surgeon.  Pain Medication:  Most commonly we prescribe Vicodin and Percocet for post-operative pain. Both of these medications contain a combination of acetaminophen (Tylenol) and a narcotic to help control pain.   It takes between 30 and 45 minutes before pain medication starts to work. It is important to take your medication before your pain level gets too intense.   Nausea is a common side effect of many pain medications. You will want to eat something before taking your pain medicine to help prevent nausea.   If you are taking a prescription pain medication that contains acetaminophen, we recommend that you do not take additional over the counter acetaminophen (Tylenol).  Other pain relieving options:   Using a cold pack to ice the affected area a few times a day (15 to 20 minutes at a time) can help to relieve pain, reduce swelling and bruising.   Elevation of the affected area can also help to reduce pain and swelling.      Post Anesthesia Home Care Instructions  Activity: Get plenty of rest for the remainder of the day. A responsible individual must stay with you for 24 hours following the procedure.  For the next 24 hours, DO NOT: -Drive a car -Paediatric nurse -Drink alcoholic beverages -Take any medication unless  instructed by your physician -Make any legal decisions or sign important papers.  Meals: Start with liquid foods such as gelatin or soup. Progress to regular foods as tolerated. Avoid greasy, spicy, heavy foods. If nausea and/or vomiting occur, drink only clear liquids until the nausea and/or vomiting subsides. Call your physician if vomiting continues.  Special  Instructions/Symptoms: Your throat may feel dry or sore from the anesthesia or the breathing tube placed in your throat during surgery. If this causes discomfort, gargle with warm salt water. The discomfort should disappear within 24 hours.  If you had a scopolamine patch placed behind your ear for the management of post- operative nausea and/or vomiting:  1. The medication in the patch is effective for 72 hours, after which it should be removed.  Wrap patch in a tissue and discard in the trash. Wash hands thoroughly with soap and water. 2. You may remove the patch earlier than 72 hours if you experience unpleasant side effects which may include dry mouth, dizziness or visual disturbances. 3. Avoid touching the patch. Wash your hands with soap and water after contact with the patch.

## 2017-07-03 NOTE — Anesthesia Procedure Notes (Signed)
Procedure Name: LMA Insertion Date/Time: 07/03/2017 8:35 AM Performed by: Genelle Bal, CRNA Pre-anesthesia Checklist: Patient identified, Emergency Drugs available, Suction available and Patient being monitored Patient Re-evaluated:Patient Re-evaluated prior to induction Oxygen Delivery Method: Circle system utilized Preoxygenation: Pre-oxygenation with 100% oxygen Induction Type: IV induction Ventilation: Mask ventilation without difficulty LMA: LMA inserted LMA Size: 5.0 Number of attempts: 1 Airway Equipment and Method: Bite block Placement Confirmation: positive ETCO2 Tube secured with: Tape Dental Injury: Teeth and Oropharynx as per pre-operative assessment

## 2017-07-03 NOTE — Transfer of Care (Signed)
Immediate Anesthesia Transfer of Care Note  Patient: [name]  Procedure(s) Performed: RIGHT HAND NEUROMA REMOVAL (Right Hand)  Patient Location: PACU  Anesthesia Type:General  Level of Consciousness: awake, alert  and oriented  Airway & Oxygen Therapy: Patient Spontanous Breathing and Patient connected to face mask oxygen  Post-op Assessment: Report given to RN and Post -op Vital signs reviewed and stable  Post vital signs: Reviewed and stable  Last Vitals:  Vitals:   07/03/17 0727  BP: (!) 151/75  Pulse: 76  Resp: 16  Temp: 36.8 C  SpO2: 100%    Last Pain:  Vitals:   07/03/17 0727  TempSrc: Oral         Complications: No apparent anesthesia complications

## 2017-07-03 NOTE — Anesthesia Postprocedure Evaluation (Signed)
Anesthesia Post Note  Patient: [name]  Procedure(s) Performed: RIGHT HAND NEUROMA REMOVAL (Right Hand)     Patient location during evaluation: PACU Anesthesia Type: General Level of consciousness: awake and alert Pain management: pain level controlled Vital Signs Assessment: post-procedure vital signs reviewed and stable Respiratory status: spontaneous breathing, nonlabored ventilation, respiratory function stable and patient connected to nasal cannula oxygen Cardiovascular status: blood pressure returned to baseline and stable Postop Assessment: no apparent nausea or vomiting Anesthetic complications: no    Last Vitals:  Vitals:   07/03/17 0937 07/03/17 1000  BP:  (!) 148/90  Pulse: 88 78  Resp: 18 19  Temp:  36.7 C  SpO2: 97% 98%    Last Pain:  Vitals:   07/03/17 1000  TempSrc:   PainSc: 3                  Berenise Hunton,JAMES TERRILL

## 2017-07-03 NOTE — H&P (Signed)
    PREOPERATIVE H&P  Chief Complaint: right hand neuroma  HPI: [name] is a 26 y.o. male who presents for surgical treatment of right hand neuroma.  He denies any changes in medical history.  Past Medical History:  Diagnosis Date  . ADHD (attention deficit hyperactivity disorder)    no current med.  . Asthma    prn inhaler  . Depression   . Migraines   . 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 OCD (obsessive compulsive disorder)   . Oppositional defiant disorder   . PTSD (post-traumatic stress disorder)   . Stuffy nose 07/01/2017   Past Surgical History:  Procedure Laterality Date  . TENDON LENGTHENING Bilateral    as a child  . TOOTH EXTRACTION     Social History   Socioeconomic History  . Marital status: Single    Spouse name: None  . Number of children: None  . Years of education: None  . Highest education level: None  Social Needs  . Financial resource strain: None  . Food insecurity - worry: None  . Food insecurity - inability: None  . Transportation needs - medical: None  . Transportation needs - non-medical: None  Occupational History  . None  Tobacco Use  . Smoking status: Former Smoker    Years: 0.00    Last attempt to quit: 06/19/2013    Years since quitting: 4.0  . Smokeless tobacco: Never Used  Substance and Sexual Activity  . Alcohol use: No    Frequency: Never  . Drug use: No  . Sexual activity: Yes    Partners: Female  Other Topics Concern  . None  Social History Narrative  . None   Family History  Problem Relation Age of Onset  . Bipolar disorder Cousin   . Alcohol abuse Paternal Aunt   . Drug abuse Paternal Aunt    Allergies  Allergen Reactions  . Strattera [Atomoxetine Hcl] Other (See Comments)    CAUSED AGGRESSION  . Sulfa Antibiotics Hives   Prior to Admission medications   Medication Sig Start Date End Date Taking? Authorizing Provider  albuterol (PROVENTIL HFA;VENTOLIN HFA) 108 (90 Base) MCG/ACT inhaler Inhale  every 6 (six) hours as needed into the lungs for wheezing or shortness of breath.    [provider]     Positive ROS: All other systems have been reviewed and were otherwise negative with the exception of those mentioned in the HPI and as above.  Physical Exam: General: Alert, no acute distress Cardiovascular: No pedal edema Respiratory: No cyanosis, no use of accessory musculature GI: abdomen soft Skin: No lesions in the area of chief complaint Neurologic: Sensation intact distally Psychiatric: Patient is competent for consent with normal mood and affect Lymphatic: no lymphedema  MUSCULOSKELETAL: exam stable  Assessment: right hand neuroma  Plan: Plan for Procedure(s): RIGHT HAND NEUROMA REMOVAL  The risks benefits and alternatives were discussed with the patient including but not limited to the risks of nonoperative treatment, versus surgical intervention including infection, bleeding, nerve injury,  blood clots, cardiopulmonary complications, morbidity, mortality, among others, and they were willing to proceed.   Eduard Roux, MD   07/03/2017 8:24 AM

## 2017-07-03 NOTE — Op Note (Signed)
   Date of Surgery: 07/03/2017  INDICATIONS: Mr. [name] is a 26 y.o.-year-old male with a right hand neuroma;  The patient did consent to the procedure after discussion of the risks and benefits.  PREOPERATIVE DIAGNOSIS: Right hand neuroma  POSTOPERATIVE DIAGNOSIS: Same.  PROCEDURE: Right hand neuroma removal  SURGEON: N. Eduard Roux, M.D.  ASSIST: none.  ANESTHESIA:  general  IV FLUIDS AND URINE: See anesthesia.  ESTIMATED BLOOD LOSS: minimal mL.  IMPLANTS: none  DRAINS: none  COMPLICATIONS: None.  DESCRIPTION OF PROCEDURE: The patient was brought to the operating room and placed supine on the operating table.  The patient had been signed prior to the procedure and this was documented. The patient had the anesthesia placed by the anesthesiologist.  A time-out was performed to confirm that this was the correct patient, site, side and location. The patient did receive antibiotics prior to the incision and was re-dosed during the procedure as needed at indicated intervals.  A tourniquet was placed.  The patient had the operative extremity prepped and draped in the standard surgical fashion.    A transverse incision based within the proximal palmar crease localized over the palpable neuroma was created.  Blunt dissection was carried through the subcutaneous tissue.  Retractors were then placed.  Bipolar cautery was used for hemostasis.  Just deep to the subcutaneous tissue was a large neuroma.  This was identified and sharply excised allowing the cutaneous nerve to retract proximally.  The wound was then thoroughly irrigated and the skin was closed with interrupted 4-0 nylon sutures.  Sterile dressings were applied.  Patient tolerated procedure well and had no immediate complications.  POSTOPERATIVE PLAN: Patient will be discharged home.  The neuroma was sent to pathology.  Azucena Cecil, MD [place] 9:00 AM

## 2017-07-03 NOTE — Anesthesia Preprocedure Evaluation (Addendum)
Anesthesia Evaluation  Patient identified by MRN, date of birth, ID band Patient awake    Reviewed: Allergy & Precautions, NPO status , Patient's Chart, lab work & pertinent test results  Airway Mallampati: I  TM Distance: >3 FB Neck ROM: Full   Comment: Tongue ring unable To be removed Dental  (+) Teeth Intact, Dental Advisory Given   Pulmonary asthma , former smoker,    breath sounds clear to auscultation       Cardiovascular negative cardio ROS   Rhythm:Regular Rate:Normal     Neuro/Psych Anxiety Depression negative neurological ROS  negative psych ROS   GI/Hepatic negative GI ROS, Neg liver ROS,   Endo/Other  negative endocrine ROS  Renal/GU negative Renal ROS  negative genitourinary   Musculoskeletal negative musculoskeletal ROS (+)   Abdominal   Peds negative pediatric ROS (+)  Hematology negative hematology ROS (+)   Anesthesia Other Findings   Reproductive/Obstetrics negative OB ROS                            Anesthesia Physical Anesthesia Plan  ASA: II  Anesthesia Plan: General   Post-op Pain Management:    Induction: Intravenous  PONV Risk Score and Plan: 3 and Ondansetron, Dexamethasone and Midazolam  Airway Management Planned: LMA  Additional Equipment:   Intra-op Plan:   Post-operative Plan: Extubation in OR  Informed Consent: I have reviewed the patients History and Physical, chart, labs and discussed the procedure including the risks, benefits and alternatives for the proposed anesthesia with the patient or authorized representative who has indicated his/her understanding and acceptance.   Dental advisory given  Plan Discussed with: CRNA  Anesthesia Plan Comments:         Anesthesia Quick Evaluation

## 2017-07-04 ENCOUNTER — Encounter (HOSPITAL_BASED_OUTPATIENT_CLINIC_OR_DEPARTMENT_OTHER): Payer: Self-pay | Admitting: Orthopaedic Surgery

## 2017-07-05 ENCOUNTER — Emergency Department (HOSPITAL_COMMUNITY): Payer: Medicaid Other

## 2017-07-05 ENCOUNTER — Emergency Department (HOSPITAL_COMMUNITY)
Admission: EM | Admit: 2017-07-05 | Discharge: 2017-07-05 | Disposition: A | Discharge status: Home / Self Care | Payer: Medicaid Other | Attending: Emergency Medicine | Admitting: Emergency Medicine

## 2017-07-05 ENCOUNTER — Telehealth: Payer: Self-pay | Admitting: Family Medicine

## 2017-07-05 ENCOUNTER — Encounter (HOSPITAL_COMMUNITY): Payer: Self-pay

## 2017-07-05 DIAGNOSIS — R200 Anesthesia of skin: Secondary | ICD-10-CM | POA: Insufficient documentation

## 2017-07-05 DIAGNOSIS — J45909 Unspecified asthma, uncomplicated: Secondary | ICD-10-CM | POA: Diagnosis not present

## 2017-07-05 DIAGNOSIS — Z79899 Other long term (current) drug therapy: Secondary | ICD-10-CM | POA: Diagnosis not present

## 2017-07-05 DIAGNOSIS — Z87891 Personal history of nicotine dependence: Secondary | ICD-10-CM | POA: Diagnosis not present

## 2017-07-05 DIAGNOSIS — R202 Paresthesia of skin: Secondary | ICD-10-CM | POA: Diagnosis not present

## 2017-07-05 DIAGNOSIS — E876 Hypokalemia: Secondary | ICD-10-CM | POA: Insufficient documentation

## 2017-07-05 LAB — CBC WITH DIFFERENTIAL/PLATELET
BASOS PCT: 0 %
Basophils Absolute: 0 10*3/uL (ref 0.0–0.1)
EOS ABS: 0.1 10*3/uL (ref 0.0–0.7)
EOS PCT: 1 %
HCT: 44.1 % (ref 39.0–52.0)
HEMOGLOBIN: 14.8 g/dL (ref 13.0–17.0)
Lymphocytes Relative: 25 %
Lymphs Abs: 3 10*3/uL (ref 0.7–4.0)
MCH: 28.7 pg (ref 26.0–34.0)
MCHC: 33.6 g/dL (ref 30.0–36.0)
MCV: 85.5 fL (ref 78.0–100.0)
MONOS PCT: 6 %
Monocytes Absolute: 0.7 10*3/uL (ref 0.1–1.0)
NEUTROS PCT: 68 %
Neutro Abs: 8.4 10*3/uL — ABNORMAL HIGH (ref 1.7–7.7)
PLATELETS: 256 10*3/uL (ref 150–400)
RBC: 5.16 MIL/uL (ref 4.22–5.81)
RDW: 14.3 % (ref 11.5–15.5)
WBC: 12.3 10*3/uL — AB (ref 4.0–10.5)

## 2017-07-05 LAB — COMPREHENSIVE METABOLIC PANEL
ALBUMIN: 4.2 g/dL (ref 3.5–5.0)
ALK PHOS: 62 U/L (ref 38–126)
ALT: 32 U/L (ref 17–63)
ANION GAP: 9 (ref 5–15)
AST: 21 U/L (ref 15–41)
BUN: 17 mg/dL (ref 6–20)
CALCIUM: 8.6 mg/dL — AB (ref 8.9–10.3)
CHLORIDE: 104 mmol/L (ref 101–111)
CO2: 25 mmol/L (ref 22–32)
Creatinine, Ser: 0.77 mg/dL (ref 0.61–1.24)
GFR calc non Af Amer: >60 mL/min (ref >60)
GLUCOSE: 124 mg/dL — AB (ref 65–99)
Potassium: 3 mmol/L — ABNORMAL LOW (ref 3.5–5.1)
SODIUM: 138 mmol/L (ref 135–145)
Total Bilirubin: 0.6 mg/dL (ref 0.3–1.2)
Total Protein: 7.5 g/dL (ref 6.5–8.1)

## 2017-07-05 MED ORDER — POTASSIUM CHLORIDE CRYS ER 20 MEQ PO TBCR
40.0000 meq | EXTENDED_RELEASE_TABLET | Freq: Once | ORAL | Status: AC
Start: 2017-07-05 — End: 2017-07-05
  Administered 2017-07-05: 40 meq via ORAL
  Filled 2017-07-05: qty 2

## 2017-07-05 MED ORDER — POTASSIUM CHLORIDE ER 10 MEQ PO TBCR: 10.0000 meq | EXTENDED_RELEASE_TABLET | Freq: Two times a day (BID) | ORAL | 0 refills | Status: DC

## 2017-07-05 NOTE — ED Provider Notes (Addendum)
[facility] EMERGENCY DEPARTMENT Provider Note   CSN: [id number] Arrival date & time: 07/05/17  1402     History   Chief Complaint Chief Complaint  Patient presents with  . Numbness    HPI [name] is a 26 y.o. male.  Patient presents with numbness on his left face, left neck, left shoulder, left arm at approximately 4 AM today.  He felt like he was having a panic attack.  No confusion, facial drooping, altered speech, gross neurological deficits.  Status post right hand neuroma surgery on 07/03/17.  Past medical history includes ADHD, depression, OCD, oppositional defiant disorder, PTSD.  Severity of symptoms is mild to moderate.      Past Medical History:  Diagnosis Date  . ADHD (attention deficit hyperactivity disorder)    no current med.  . Asthma    prn inhaler  . Depression   . Migraines   . 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 OCD (obsessive compulsive disorder)   . Oppositional defiant disorder   . PTSD (post-traumatic stress disorder)   . Stuffy nose 07/01/2017    Patient Active Problem List   Diagnosis Date Noted  . Neuroma of hand 06/24/2017  . Snoring 12/08/2015  . OCD (obsessive compulsive disorder) 08/06/2012  . Unspecified episodic mood disorder 07/18/2011  . ADHD (attention deficit hyperactivity disorder), combined type 07/18/2011  . 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 CLOSED FRACTURE METACARPAL BONE SITE UNSPECIFIED 11/24/2009    Past Surgical History:  Procedure Laterality Date  . neuroma of hand removed    . RIGHT HAND NEUROMA REMOVAL Right 07/03/2017   Performed by Leandrew Koyanagi, MD at [facility]  . TENDON LENGTHENING Bilateral    as a child  . TOOTH EXTRACTION         Home Medications    Prior to Admission medications   Medication Sig Start Date End Date Taking? Authorizing Provider  albuterol (PROVENTIL HFA;VENTOLIN HFA) 108 (90 Base) MCG/ACT inhaler Inhale every 6 (six) hours as needed into the lungs  for wheezing or shortness of breath.   Yes [provider]  HYDROcodone-acetaminophen (NORCO) 5-325 MG tablet Take 1-2 tablets every 6 (six) hours as needed by mouth. 07/03/17  Yes Leandrew Koyanagi, MD  ibuprofen (ADVIL,MOTRIN) 600 MG tablet Take 1,200 mg every 6 (six) hours as needed by mouth for headache or moderate pain.    Yes [provider]  loratadine (CLARITIN) 10 MG tablet Take 10 mg daily as needed by mouth for allergies.   Yes [provider]  VYVANSE 40 MG capsule Take 1 capsule daily by mouth. 07/03/17  Yes [provider]  potassium chloride (K-DUR) 10 MEQ tablet Take 1 tablet (10 mEq total) 2 (two) times daily by mouth. 07/05/17   Nat Christen, MD    Family History Family History  Problem Relation Age of Onset  . Bipolar disorder Cousin   . Alcohol abuse Paternal Aunt   . Drug abuse Paternal Aunt     Social History Social History   Tobacco Use  . Smoking status: Former Smoker    Years: 0.00    Last attempt to quit: 06/19/2013    Years since quitting: 4.0  . Smokeless tobacco: Never Used  Substance Use Topics  . Alcohol use: No    Frequency: Never  . Drug use: No     Allergies   Strattera [atomoxetine hcl] and Sulfa antibiotics   Review of Systems Review of Systems  All  other systems reviewed and are negative.    Physical Exam Updated Vital Signs BP (!) 130/59 (BP Location: Left Arm)   Pulse 95   Temp 98.2 F (36.8 C) (Oral)   Resp 20   Ht 5\' 6"  (1.676 m)   Wt 92.1 kg (203 lb)   SpO2 95%   BMI 32.77 kg/m   Physical Exam  Constitutional: He is oriented to person, place, and time. He appears well-developed and well-nourished.  HENT:  Head: Normocephalic and atraumatic.  Eyes: Conjunctivae are normal.  Neck: Neck supple.  Cardiovascular: Normal rate and regular rhythm.  Pulmonary/Chest: Effort normal and breath sounds normal.  Abdominal: Soft. Bowel sounds are normal.  Musculoskeletal: Normal range of motion.    Neurological: He is alert and oriented to person, place, and time.  Skin: Skin is warm and dry.  Psychiatric: He has a normal mood and affect. His behavior is normal.  Nursing note and vitals reviewed.    ED Treatments / Results  Labs (all labs ordered are listed, but only abnormal results are displayed) Labs Reviewed  CBC WITH DIFFERENTIAL/PLATELET - Abnormal; Notable for the following components:      Result Value   WBC 12.3 (*)    Neutro Abs 8.4 (*)    All other components within normal limits  COMPREHENSIVE METABOLIC PANEL - Abnormal; Notable for the following components:   Potassium 3.0 (*)    Glucose, Bld 124 (*)    Calcium 8.6 (*)    All other components within normal limits    EKG  EKG Interpretation None       Radiology Ct Head Wo Contrast  Result Date: 07/05/2017 CLINICAL DATA:  Left-sided upper extremity numbness and dizziness EXAM: CT HEAD WITHOUT CONTRAST CT CERVICAL SPINE WITHOUT CONTRAST TECHNIQUE: Multidetector CT imaging of the head and cervical spine was performed following the standard protocol without intravenous contrast. Multiplanar CT image reconstructions of the cervical spine were also generated. COMPARISON:  None. FINDINGS: CT HEAD FINDINGS Brain: The ventricles are normal in size and configuration. There is no intracranial mass, hemorrhage, extra-axial fluid collection, or midline shift. Gray-white compartments appear normal. No acute infarct evident. Vascular: No hyperdense vessel.  No evident vascular calcification. Skull: Bony calvarium appears intact. Sinuses/Orbits: There is mild mucosal thickening in posterior ethmoid air cells. Other visualized paranasal sinuses are clear. Visualized orbits appear symmetric bilaterally. Other: Mastoid air cells are clear. CT CERVICAL SPINE FINDINGS Alignment: There is no spondylolisthesis. Skull base and vertebrae: Skull base and craniocervical junction regions appear normal. No evident fracture. There are no  blastic or lytic bone lesions. Soft tissues and spinal canal: Prevertebral soft tissues and predental space regions are normal. No paraspinous lesions. No evident cord or canal hematoma. Disc levels: There is no appreciable disc space narrowing. There are small anterior osteophytes at C5 and C6. There is no appreciable exit foraminal narrowing. No evident nerve root edema or effacement on either side. No disc extrusion or stenosis. Upper chest: Visualized upper lung zone regions are clear. Other: None IMPRESSION: CT head: Mild ethmoid sinus disease. Study otherwise unremarkable. No intracranial mass, hemorrhage, or extra-axial fluid collection. Gray-white compartments appear normal. CT cervical spine: Minimal osteoarthritic change. No fracture or spondylolisthesis. No evident nerve root edema or effacement. No disc extrusion or stenosis. Electronically Signed   By: Lowella Grip III M.D.   On: 07/05/2017 15:32   Ct Cervical Spine Wo Contrast  Result Date: 07/05/2017 CLINICAL DATA:  Left-sided upper extremity numbness and dizziness EXAM: CT  HEAD WITHOUT CONTRAST CT CERVICAL SPINE WITHOUT CONTRAST TECHNIQUE: Multidetector CT imaging of the head and cervical spine was performed following the standard protocol without intravenous contrast. Multiplanar CT image reconstructions of the cervical spine were also generated. COMPARISON:  None. FINDINGS: CT HEAD FINDINGS Brain: The ventricles are normal in size and configuration. There is no intracranial mass, hemorrhage, extra-axial fluid collection, or midline shift. Gray-white compartments appear normal. No acute infarct evident. Vascular: No hyperdense vessel.  No evident vascular calcification. Skull: Bony calvarium appears intact. Sinuses/Orbits: There is mild mucosal thickening in posterior ethmoid air cells. Other visualized paranasal sinuses are clear. Visualized orbits appear symmetric bilaterally. Other: Mastoid air cells are clear. CT CERVICAL SPINE  FINDINGS Alignment: There is no spondylolisthesis. Skull base and vertebrae: Skull base and craniocervical junction regions appear normal. No evident fracture. There are no blastic or lytic bone lesions. Soft tissues and spinal canal: Prevertebral soft tissues and predental space regions are normal. No paraspinous lesions. No evident cord or canal hematoma. Disc levels: There is no appreciable disc space narrowing. There are small anterior osteophytes at C5 and C6. There is no appreciable exit foraminal narrowing. No evident nerve root edema or effacement on either side. No disc extrusion or stenosis. Upper chest: Visualized upper lung zone regions are clear. Other: None IMPRESSION: CT head: Mild ethmoid sinus disease. Study otherwise unremarkable. No intracranial mass, hemorrhage, or extra-axial fluid collection. Gray-white compartments appear normal. CT cervical spine: Minimal osteoarthritic change. No fracture or spondylolisthesis. No evident nerve root edema or effacement. No disc extrusion or stenosis. Electronically Signed   By: Lowella Grip III M.D.   On: 07/05/2017 15:32    Procedures Procedures (including critical care time)  Medications Ordered in ED Medications  potassium chloride SA (K-DUR,KLOR-CON) CR tablet 40 mEq (40 mEq Oral Given 07/05/17 1543)     Initial Impression / Assessment and Plan / ED Course  I have reviewed the triage vital signs and the nursing notes.  Pertinent labs & imaging results that were available during my care of the patient were reviewed by me and considered in my medical decision making (see chart for details).    Patient presents with a left facial, left neck, left shoulder, left arm numbness.  He does not appear to have a grossly abnormal neuro exam.  CT head and CT cervical spine showed no acute changes.  He was slightly hypokalemic.  On discharge he appeared to be back to normal neurologically.  These findings were discussed with the patient and his  mother.  Will replace potassium as an outpatient.   Final Clinical Impressions(s) / ED Diagnoses   Final diagnoses:  Numbness  Hypokalemia    ED Discharge Orders        Ordered    potassium chloride (K-DUR) 10 MEQ tablet  2 times daily     07/05/17 1858       Nat Christen, MD 07/05/17 Einar Crow    Nat Christen, MD 07/05/17 2154

## 2017-07-05 NOTE — ED Notes (Signed)
Pt reports that he is feeling better with less numbness

## 2017-07-05 NOTE — ED Notes (Signed)
To CT

## 2017-07-05 NOTE — Discharge Instructions (Addendum)
CT scan of head and cervical spine show no acute findings.  Your potassium is slightly low.  Will prescribe potassium for 10 days.  Try to eat potassium rich foods.  Return if worse or follow-up with your primary care doctor.

## 2017-07-05 NOTE — ED Triage Notes (Signed)
Pt reports woke up at 3am with left sided numbness.  Reports still having numbness, and c/o feeling dizzy and heart racing.  Reports had r arm surgery Wednesday.

## 2017-07-05 NOTE — Telephone Encounter (Signed)
Patient called in stating that he is experiencing left sided numbness and dizziness. Patient also states that his heart rate is elevated. Advised patient to go to the ED for further workup. Patient verbalized understanding.

## 2017-07-05 NOTE — ED Notes (Signed)
Pt reports he is to be sent home with a potassium prerscription  Dr Lacinda Axon will write

## 2017-07-05 NOTE — ED Notes (Signed)
Reports L sided numbness since 0300  Recent surgery to R arm  Denies drug usage, oral sex, recent illness  Pt is alert conversant

## 2017-07-08 ENCOUNTER — Ambulatory Visit: Payer: Medicaid Other | Admitting: Family Medicine

## 2017-07-08 ENCOUNTER — Telehealth (INDEPENDENT_AMBULATORY_CARE_PROVIDER_SITE_OTHER): Payer: Self-pay | Admitting: Orthopaedic Surgery

## 2017-07-08 ENCOUNTER — Encounter: Payer: Self-pay | Admitting: Family Medicine

## 2017-07-08 VITALS — BP 120/84 | Ht 66.0 in | Wt 202.5 lb

## 2017-07-08 DIAGNOSIS — E876 Hypokalemia: Secondary | ICD-10-CM | POA: Diagnosis not present

## 2017-07-08 DIAGNOSIS — R635 Abnormal weight gain: Secondary | ICD-10-CM | POA: Diagnosis not present

## 2017-07-08 DIAGNOSIS — R5383 Other fatigue: Secondary | ICD-10-CM | POA: Diagnosis not present

## 2017-07-08 DIAGNOSIS — E785 Hyperlipidemia, unspecified: Secondary | ICD-10-CM | POA: Diagnosis not present

## 2017-07-08 MED ORDER — POTASSIUM CHLORIDE ER 10 MEQ PO TBCR: 10.0000 meq | EXTENDED_RELEASE_TABLET | Freq: Every day | ORAL | 4 refills | Status: DC

## 2017-07-08 NOTE — Telephone Encounter (Signed)
FYI: Patient went to ED with the whole right side of his body numb, was diagnosed with a potassium deficiency, Pt is taking Potassium cler 10 meq tablet. Was advised to let Dr. Erlinda Hong know since he did his surgery. Thanks!

## 2017-07-08 NOTE — Telephone Encounter (Signed)
Ok thanks 

## 2017-07-08 NOTE — Telephone Encounter (Signed)
See message below °

## 2017-07-08 NOTE — Progress Notes (Signed)
   Subjective:    Patient ID: [name], male    DOB: June 01, 1991, 26 y.o.   MRN: [id number]  HPI Patient in today for hospital follow up on 07/05/17 for Numbness and hypokalemia.  Patient was in the ER with some intermittent numbness as well as feeling woozy extensive lab work x-rays were done showed hypokalemia CT scan negative  Patient also has concerns of thyroid.  Review of Systems  Constitutional: Negative for activity change and fever.  HENT: Positive for congestion and rhinorrhea. Negative for ear pain.   Eyes: Negative for discharge.  Respiratory: Positive for cough. Negative for wheezing.   Cardiovascular: Negative for chest pain.       Objective:   Physical Exam  Constitutional: He appears well-developed.  HENT:  Head: Normocephalic.  Mouth/Throat: Oropharynx is clear and moist. No oropharyngeal exudate.  Neck: Normal range of motion.  Cardiovascular: Normal rate, regular rhythm and normal heart sounds.  No murmur heard. Pulmonary/Chest: Effort normal and breath sounds normal. He has no wheezes.  Lymphadenopathy:    He has no cervical adenopathy.  Neurological: He exhibits normal muscle tone.  Skin: Skin is warm and dry.  Nursing note and vitals reviewed.   Exam normal Patient complains of some fatigue and weight gain we will check other lab work     Assessment & Plan:  Hypokalemia Potassium 10 mEq daily Watch diet closely Check lab work on approximately 2-3  weeks from now  No need for additional tests other than that

## 2017-07-16 ENCOUNTER — Ambulatory Visit (INDEPENDENT_AMBULATORY_CARE_PROVIDER_SITE_OTHER): Payer: Medicaid Other | Admitting: *Deleted

## 2017-07-16 DIAGNOSIS — Z23 Encounter for immunization: Secondary | ICD-10-CM | POA: Diagnosis not present

## 2017-07-18 ENCOUNTER — Ambulatory Visit (INDEPENDENT_AMBULATORY_CARE_PROVIDER_SITE_OTHER): Payer: Medicaid Other | Admitting: Orthopaedic Surgery

## 2017-07-18 DIAGNOSIS — D3612 Benign neoplasm of peripheral nerves and autonomic nervous system, upper limb, including shoulder: Secondary | ICD-10-CM

## 2017-07-18 NOTE — Progress Notes (Signed)
Patient is two-week status post right hand neuroma removal.  He reports no pain today and and reports complete resolution of his symptoms.  The pathology was consistent with a neuroma.  His incision has healed without any signs of infection.  There is no swelling or bruising or drainage.  The sutures were removed today.  At this point increase activity and use of the hand as tolerated.  Patient in agreement with the plan.  Follow-up as needed.

## 2017-08-01 ENCOUNTER — Ambulatory Visit (HOSPITAL_COMMUNITY): Payer: Medicaid Other | Admitting: Psychiatry

## 2017-08-01 ENCOUNTER — Encounter (HOSPITAL_COMMUNITY): Payer: Self-pay | Admitting: Psychiatry

## 2017-08-01 VITALS — BP 122/80 | HR 104 | Ht 66.0 in | Wt 203.0 lb

## 2017-08-01 DIAGNOSIS — Z87891 Personal history of nicotine dependence: Secondary | ICD-10-CM

## 2017-08-01 DIAGNOSIS — F902 Attention-deficit hyperactivity disorder, combined type: Secondary | ICD-10-CM

## 2017-08-01 DIAGNOSIS — Z811 Family history of alcohol abuse and dependence: Secondary | ICD-10-CM

## 2017-08-01 DIAGNOSIS — Z818 Family history of other mental and behavioral disorders: Secondary | ICD-10-CM | POA: Diagnosis not present

## 2017-08-01 DIAGNOSIS — Z813 Family history of other psychoactive substance abuse and dependence: Secondary | ICD-10-CM | POA: Diagnosis not present

## 2017-08-01 MED ORDER — LISDEXAMFETAMINE DIMESYLATE 40 MG PO CAPS: 40.0000 mg | ORAL_CAPSULE | ORAL | 0 refills | Status: DC

## 2017-08-01 MED ORDER — LISDEXAMFETAMINE DIMESYLATE 40 MG PO CAPS: 40.0000 mg | ORAL_CAPSULE | Freq: Every day | ORAL | 0 refills | Status: DC

## 2017-08-01 NOTE — Progress Notes (Signed)
BH MD/PA/NP OP Progress Note  08/01/2017 2:15 PM [name]  MRN:  [id number]  Chief Complaint:  Chief Complaint    Depression; ADHD; Follow-up     HPI: This patient is a 26 year old separated white male who has been living with his Children in [place] He is working delivering pizzas  His mother reports that he was always a very hyperactive child wouldn't listen and acted out. He was constantly in trouble in school. He started on Ritalin at an early age but it made him like a zombie and he lost a lot of weight. Over the years she's been on numerous stimulants. At age 65 his father took custody of him. He did not do well with his father and was very oppositional. He was eventually placed in a group home and went through 3 group homes to his teen years.  At age 72 he was arrested for continued contributing to do the deliquency of a minor. He was dating a 25 year old girl who ran away from home and was found with him he spent about 40 days in jail but he claims "turn my life around." Shortly after getting out of jail his father got ill with cancer and he spent a lot of time with him and was glad that they could reunite. The patient states he's currently doing well. He's under good control of his behavior. He's not particularly impulsive and is stable to stay focused.  She returns after 3 months.  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  He really enjoys the job and his boss.  He and his wife first sharing custody of the children and for now it is going well.  He denies being depressed.  He states that the Vyvanse still helps him to focus.  He was recently diagnosed with hypokalemia and is taking 10 mEq of potassium a day which he claims is really helped his energy   ICD-10-CM   1. ADHD (attention deficit hyperactivity disorder), combined type F90.2     Past Psychiatric History: Past outpatient treatment for ADHD  Past Medical History:  Past Medical History:  Diagnosis  Date  . ADHD (attention deficit hyperactivity disorder)    no current med.  . Asthma    prn inhaler  . Depression   . Migraines   . 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 OCD (obsessive compulsive disorder)   . Oppositional defiant disorder   . PTSD (post-traumatic stress disorder)   . Stuffy nose 07/01/2017    Past Surgical History:  Procedure Laterality Date  . GANGLION CYST EXCISION Right 07/03/2017   Procedure: RIGHT HAND NEUROMA REMOVAL;  Surgeon: Leandrew Koyanagi, MD;  Location: [facility];  Service: Orthopedics;  Laterality: Right;  . neuroma of hand removed    . TENDON LENGTHENING Bilateral    as a child  . TOOTH EXTRACTION      Family Psychiatric History: See below  Family History:  Family History  Problem Relation Age of Onset  . Bipolar disorder Cousin   . Alcohol abuse Paternal Aunt   . Drug abuse Paternal Aunt     Social History:  Social History   Socioeconomic History  . Marital status: Single    Spouse name: None  . Number of children: None  . Years of education: None  . Highest education level: None  Social Needs  . Financial resource strain: None  . Food insecurity - worry: None  . Food  insecurity - inability: None  . Transportation needs - medical: None  . Transportation needs - non-medical: None  Occupational History  . None  Tobacco Use  . Smoking status: Former Smoker    Years: 0.00    Last attempt to quit: 06/19/2013    Years since quitting: 4.1  . Smokeless tobacco: Never Used  Substance and Sexual Activity  . Alcohol use: No    Frequency: Never  . Drug use: No  . Sexual activity: Yes    Partners: Female  Other Topics Concern  . None  Social History Narrative  . None    Allergies:  Allergies  Allergen Reactions  . Strattera [Atomoxetine Hcl] Other (See Comments)    CAUSED AGGRESSION  . Sulfa Antibiotics Hives    Metabolic Disorder Labs: Lab Results  Component Value Date   HGBA1C  05/13/2008    5.2 (NOTE)    The ADA recommends the following therapeutic goal for glycemic   control related to Hgb A1C measurement:   Goal of Therapy:   < 7.0% Hgb A1C   Reference: American Diabetes Association: Clinical Practice   Recommendations 2008, Diabetes Care,  2008, 31:(Suppl 1).   MPG 103 05/13/2008   No results found for: PROLACTIN Lab Results  Component Value Date   CHOL 240 (H) 09/22/2013   TRIG 162 (H) 09/22/2013   HDL 47 09/22/2013   CHOLHDL 5.1 09/22/2013   VLDL 32 09/22/2013   LDLCALC 161 (H) 09/22/2013   LDLCALC  05/12/2008    94        Total Cholesterol/HDL:CHD Risk Coronary Heart Disease Risk Table                     Men   Women  1/2 Average Risk   3.4   3.3   Lab Results  Component Value Date   TSH 0.862 10/01/2015   TSH 0.887 09/25/2014    Therapeutic Level Labs: No results found for: LITHIUM Lab Results  Component Value Date   VALPROATE 79.6 05/12/2008   No components found for:  CBMZ  Current Medications: Current Outpatient Medications  Medication Sig Dispense Refill  . albuterol (PROVENTIL HFA;VENTOLIN HFA) 108 (90 Base) MCG/ACT inhaler Inhale every 6 (six) hours as needed into the lungs for wheezing or shortness of breath.    Marland Kitchen HYDROcodone-acetaminophen (NORCO) 5-325 MG tablet Take 1-2 tablets every 6 (six) hours as needed by mouth. 30 tablet 0  . ibuprofen (ADVIL,MOTRIN) 600 MG tablet Take 1,200 mg every 6 (six) hours as needed by mouth for headache or moderate pain.     Marland Kitchen lisdexamfetamine (VYVANSE) 40 MG capsule Take 1 capsule (40 mg total) by mouth daily. 30 capsule 0  . loratadine (CLARITIN) 10 MG tablet Take 10 mg daily as needed by mouth for allergies.    . potassium chloride (K-DUR) 10 MEQ tablet Take 1 tablet (10 mEq total) daily by mouth. 30 tablet 4  . lisdexamfetamine (VYVANSE) 40 MG capsule Take 1 capsule (40 mg total) by mouth every morning. 30 capsule 0  . lisdexamfetamine (VYVANSE) 40 MG capsule Take 1 capsule (40 mg total) by mouth every morning. 30  capsule 0   No current facility-administered medications for this visit.      Musculoskeletal: Strength & Muscle Tone: within normal limits Gait & Station: normal Patient leans: N/A  Psychiatric Specialty Exam: Review of Systems  All other systems reviewed and are negative.   Blood pressure 122/80, pulse (!) 104, height 5\' 6"  (1.676  m), weight 203 lb (92.1 kg), SpO2 95 %.Body mass index is 32.77 kg/m.  General Appearance: Casual and Fairly Groomed  Eye Contact:  Good  Speech:  Clear and Coherent  Volume:  Normal  Mood:  Euthymic  Affect:  Congruent  Thought Process:  Goal Directed  Orientation:  Full (Time, Place, and Person)  Thought Content: WDL   Suicidal Thoughts:  No  Homicidal Thoughts:  No  Memory:  Immediate;   Good Recent;   Good Remote;   Fair  Judgement:  Fair  Insight:  Lacking  Psychomotor Activity:  Increased  Concentration:  Concentration: Good and Attention Span: Good  Recall:  Good  Fund of Knowledge: Good  Language: Good  Akathisia:  No  Handed:  Right  AIMS (if indicated): not done  Assets:  Communication Skills Desire for Improvement Physical Health Resilience Social Support  ADL's:  Intact  Cognition: WNL  Sleep:  Good   Screenings:   Assessment and Plan: This patient is a 26 year old male with a history of ADHD.  He states that the Vyvanse continues to help with his focus.  He will continue Vyvanse 40 mg every morning and return to see me in 3 months.  He states that he no longer needs antidepressants   Levonne Spiller, MD 08/01/2017, 2:15 PM

## 2017-08-02 ENCOUNTER — Ambulatory Visit (HOSPITAL_COMMUNITY): Payer: Medicaid Other | Admitting: Licensed Clinical Social Worker

## 2017-08-02 DIAGNOSIS — F902 Attention-deficit hyperactivity disorder, combined type: Secondary | ICD-10-CM | POA: Diagnosis not present

## 2017-08-02 DIAGNOSIS — F39 Unspecified mood [affective] disorder: Secondary | ICD-10-CM

## 2017-08-02 LAB — LIPID PANEL
CHOLESTEROL TOTAL: 244 mg/dL — AB (ref 100–199)
Chol/HDL Ratio: 5.8 ratio — ABNORMAL HIGH (ref 0.0–5.0)
HDL: 42 mg/dL (ref >39)
LDL CALC: 175 mg/dL — AB (ref 0–99)
Triglycerides: 135 mg/dL (ref 0–149)
VLDL CHOLESTEROL CAL: 27 mg/dL (ref 5–40)

## 2017-08-02 LAB — T4, FREE: Free T4: 1.35 ng/dL (ref 0.82–1.77)

## 2017-08-02 LAB — MAGNESIUM: MAGNESIUM: 2.1 mg/dL (ref 1.6–2.3)

## 2017-08-02 LAB — BASIC METABOLIC PANEL
BUN / CREAT RATIO: 18 (ref 9–20)
BUN: 15 mg/dL (ref 6–20)
CO2: 24 mmol/L (ref 20–29)
CREATININE: 0.82 mg/dL (ref 0.76–1.27)
Calcium: 9.5 mg/dL (ref 8.7–10.2)
Chloride: 106 mmol/L (ref 96–106)
GFR calc non Af Amer: 123 mL/min/{1.73_m2} (ref >59)
GFR, EST AFRICAN AMERICAN: 142 mL/min/{1.73_m2} (ref >59)
GLUCOSE: 96 mg/dL (ref 65–99)
Potassium: 5 mmol/L (ref 3.5–5.2)
Sodium: 143 mmol/L (ref 134–144)

## 2017-08-02 LAB — TSH: TSH: 0.774 u[IU]/mL (ref 0.450–4.500)

## 2017-08-03 ENCOUNTER — Encounter: Payer: Self-pay | Admitting: Family Medicine

## 2017-08-05 ENCOUNTER — Encounter (HOSPITAL_COMMUNITY): Payer: Self-pay | Admitting: Licensed Clinical Social Worker

## 2017-08-05 NOTE — Progress Notes (Signed)
   THERAPIST PROGRESS NOTE  Session Time: 11-12  Participation Level: Active  Behavioral Response: Casual and Fairly GroomedAlertAnxious and Irritable  Type of Therapy: Individual Therapy  Treatment Goals addressed: Anxiety and Coping  Interventions: CBT, Strength-based, Supportive and Reframing  Summary: [name] is a 26 y.o. male who presents with unspecified sx of anxiety and depression primarily related to his relationship w/ his girlfriend. Pt presents as frustrated and engaged in session. He frequently repeats the things he recently said to his gf to "set their relationship straight", despite counselor's attempt to steer conversation towards pt's inner experience.  Pt reports he and his gf have been "broken up" for past 2 weeks but decided last night via text message that they are back together and pt will give her "another try". Counselor uses frank immediacy to help pt gain insight into his negative depiction of his gf, his negative feeling state that "she causes him". Pt accepts this and states "I know but I just love her more than anyone else". Counselor and pt discuss strategies for increasing active listening and setting boundaries in a relationship. Pt was encouraged to "ask for what he needs from his gf, including seeing her at least 3 times per week (hx of only seeing her "a couple times a month due to her being tired and busy").   Suicidal/Homicidal: Nowithout intent/plan  Therapist Response: Counselor used CBT and immediacy to help pt gain insight into his seemingly codependent relationship w/ his gf. Pt continues to function wnl as a father w/ support from his mother. Pt continues to work at [employer] and reports he enjoys it.   Plan: Return again in 4 weeks.  Diagnosis:    ICD-10-CM   1. Unspecified mood (affective) disorder (HCC) F39   2. ADHD (attention deficit hyperactivity disorder), combined type F90.2        [name], LCAS-A 08/05/2017

## 2017-08-27 ENCOUNTER — Ambulatory Visit (INDEPENDENT_AMBULATORY_CARE_PROVIDER_SITE_OTHER): Payer: Medicaid Other | Admitting: Licensed Clinical Social Worker

## 2017-08-27 ENCOUNTER — Encounter (HOSPITAL_COMMUNITY): Payer: Self-pay | Admitting: Licensed Clinical Social Worker

## 2017-08-27 DIAGNOSIS — F39 Unspecified mood [affective] disorder: Secondary | ICD-10-CM
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
## 2017-08-30 ENCOUNTER — Ambulatory Visit (HOSPITAL_COMMUNITY): Payer: Self-pay | Admitting: Licensed Clinical Social Worker

## 2017-09-09 ENCOUNTER — Other Ambulatory Visit: Payer: Self-pay | Admitting: *Deleted

## 2017-09-09 ENCOUNTER — Other Ambulatory Visit: Payer: Self-pay | Admitting: Family Medicine

## 2017-09-09 DIAGNOSIS — E876 Hypokalemia: Secondary | ICD-10-CM

## 2017-09-10 LAB — BASIC METABOLIC PANEL
BUN/Creatinine Ratio: 19 (ref 9–20)
BUN: 16 mg/dL (ref 6–20)
CO2: 26 mmol/L (ref 20–29)
CREATININE: 0.83 mg/dL (ref 0.76–1.27)
Calcium: 10 mg/dL (ref 8.7–10.2)
Chloride: 102 mmol/L (ref 96–106)
GFR calc Af Amer: 141 mL/min/{1.73_m2} (ref >59)
GFR, EST NON AFRICAN AMERICAN: 122 mL/min/{1.73_m2} (ref >59)
Glucose: 89 mg/dL (ref 65–99)
Potassium: 4.5 mmol/L (ref 3.5–5.2)
SODIUM: 144 mmol/L (ref 134–144)

## 2017-09-11 NOTE — Addendum Note (Signed)
Addended by: Karle Barr on: 09/11/2017 12:15 PM   Modules accepted: Orders

## 2017-09-27 ENCOUNTER — Ambulatory Visit (HOSPITAL_COMMUNITY): Payer: Medicaid Other | Admitting: Licensed Clinical Social Worker

## 2017-09-27 DIAGNOSIS — F39 Unspecified mood [affective] disorder: Secondary | ICD-10-CM | POA: Diagnosis not present

## 2017-09-27 DIAGNOSIS — F902 Attention-deficit hyperactivity disorder, combined type: Secondary | ICD-10-CM

## 2017-10-01 ENCOUNTER — Encounter (HOSPITAL_COMMUNITY): Payer: Self-pay | Admitting: Licensed Clinical Social Worker

## 2017-10-01 NOTE — Progress Notes (Signed)
   THERAPIST PROGRESS NOTE  Session Time: 11-12  Participation Level: Active  Behavioral Response: DisheveledAlert and LethargicDepressed and Hopeless  Type of Therapy: Individual Therapy  Treatment Goals addressed: Diagnosis: Mood Disorder  Interventions: CBT and Supportive  Summary: [name] is a 27 y.o. male who presents for ongoing relationship issues w/ his girlfriend and their inability to commit to each other meaningfully. Counselor spent time listening to pts current state in which he was "very distressed that he and her could not agree on what they want in their relationship". Pt states he wants his girlfriend to "come around more" and is asked whether he has asked her this directly. Pt states he has and  that she will not comply. Counselor spends time challenging pt's adamant stance that he "does not like dramatic relationships" yet he continues to be involved in one. Counselor asks pt whether or not he gets anything out of being dramatic w/ his girlfriend to which pt denies being dramatic.  Pt states he is "really considering how he is so unhappy" and how he may need to pull out of the relationship since the 2 parties cannot agree. Counselor spends time reflecting pt's presentation in session, his irritability, and his mood lability.  Suicidal/Homicidal: Nowithout intent/plan  Therapist Response: Counselor used open questions and reflection to help pt challenge his cognitive distortions and enact a plan that will be congruent w/ pts values.  Plan: Return again in 4 weeks.  Diagnosis:    ICD-10-CM   1. Unspecified mood (affective) disorder (HCC) F39   2. ADHD (attention deficit hyperactivity disorder), combined type F90.2       [name], LCAS-A 10/01/2017

## 2017-10-07 ENCOUNTER — Other Ambulatory Visit (HOSPITAL_COMMUNITY): Payer: Self-pay | Admitting: Psychiatry

## 2017-10-07 ENCOUNTER — Telehealth (HOSPITAL_COMMUNITY): Payer: Self-pay | Admitting: *Deleted

## 2017-10-07 MED ORDER — LISDEXAMFETAMINE DIMESYLATE 40 MG PO CAPS: 40.0000 mg | ORAL_CAPSULE | Freq: Every day | ORAL | 0 refills | Status: DC

## 2017-10-07 NOTE — Telephone Encounter (Signed)
30 day supply resent, please call pharmacy to see if it went through

## 2017-10-07 NOTE — Telephone Encounter (Signed)
Dr Harrington Challenger Patient is here in person & the Rx called stating that since the switch the Vyvanse scripts diddn't trransfer over. Patient is requesting written scripts. I lobby awaiting response.

## 2017-10-25 LAB — BASIC METABOLIC PANEL
BUN / CREAT RATIO: 24 — AB (ref 9–20)
BUN: 17 mg/dL (ref 6–20)
CO2: 22 mmol/L (ref 20–29)
CREATININE: 0.7 mg/dL — AB (ref 0.76–1.27)
Calcium: 9.4 mg/dL (ref 8.7–10.2)
Chloride: 104 mmol/L (ref 96–106)
GFR calc non Af Amer: 131 mL/min/{1.73_m2} (ref >59)
GFR, EST AFRICAN AMERICAN: 152 mL/min/{1.73_m2} (ref >59)
Glucose: 100 mg/dL — ABNORMAL HIGH (ref 65–99)
Potassium: 4.9 mmol/L (ref 3.5–5.2)
Sodium: 142 mmol/L (ref 134–144)

## 2017-10-30 ENCOUNTER — Ambulatory Visit (HOSPITAL_COMMUNITY): Payer: Medicaid Other | Admitting: Psychiatry

## 2017-10-30 ENCOUNTER — Encounter (HOSPITAL_COMMUNITY): Payer: Self-pay | Admitting: Psychiatry

## 2017-10-30 VITALS — BP 149/96 | HR 80 | Ht 66.0 in | Wt 204.0 lb

## 2017-10-30 DIAGNOSIS — Z87891 Personal history of nicotine dependence: Secondary | ICD-10-CM

## 2017-10-30 DIAGNOSIS — Z813 Family history of other psychoactive substance abuse and dependence: Secondary | ICD-10-CM | POA: Diagnosis not present

## 2017-10-30 DIAGNOSIS — F902 Attention-deficit hyperactivity disorder, combined type: Secondary | ICD-10-CM | POA: Diagnosis not present

## 2017-10-30 DIAGNOSIS — F39 Unspecified mood [affective] disorder: Secondary | ICD-10-CM | POA: Diagnosis not present

## 2017-10-30 DIAGNOSIS — Z818 Family history of other mental and behavioral disorders: Secondary | ICD-10-CM

## 2017-10-30 DIAGNOSIS — Z811 Family history of alcohol abuse and dependence: Secondary | ICD-10-CM

## 2017-10-30 MED ORDER — LISDEXAMFETAMINE DIMESYLATE 40 MG PO CAPS: 40.0000 mg | ORAL_CAPSULE | ORAL | 0 refills | Status: DC

## 2017-10-30 MED ORDER — LISDEXAMFETAMINE DIMESYLATE 40 MG PO CAPS: 40.0000 mg | ORAL_CAPSULE | Freq: Every day | ORAL | 0 refills | Status: DC

## 2017-10-30 NOTE — Progress Notes (Signed)
BH MD/PA/NP OP Progress Note  10/30/2017 2:29 PM [name]  MRN:  [id number]  Chief Complaint:  HPI: s patient is a 77 year oldseparatedwhite male who has been living with his Children in [place] He is working delivering pizzas  His mother reports that he was always a very hyperactive child wouldn't listen and acted out. He was constantly in trouble in school. He started on Ritalin at an early age but it made him like a zombie and he lost a lot of weight. Over the years she's been on numerous stimulants. At age 49 his father took custody of him. He did not do well with his father and was very oppositional. He was eventually placed in a group home and went through 3 group homes to his teen years.  At age 41 he was arrested for continued contributing to do the deliquency of a minor. He was dating a 26 year old girl who ran away from home and was found with him he spent about 40 days in jail but he claims "turn my life around." Shortly after getting out of jail his father got ill with cancer and he spent a lot of time with him and was glad that they could reunite. The patient states he's currently doing well. He's under good control of his behavior. He's not particularly impulsive and is stable to stay focused.  The patient returns after 3 months.  Continues to work at [employer] and take care of his children.  Generally he is doing pretty well.  He states he had some periods of feeling tired and low.  He did have hypokalemia but had been taking potassium in his recent labs are all normal.  He is off the potassium now.  I am he does not get enough sleep or eat regular meals and this may be contributing to his fatigue.  He still is staying very well focused on the Vyvanse.  He denies any symptoms of depression Visit Diagnosis:    ICD-10-CM   1. Unspecified mood (affective) disorder (HCC) F39   2. ADHD (attention deficit hyperactivity disorder), combined type F90.2     Past Psychiatric  History: Past outpatient treatment for ADHD  Past Medical History:  Past Medical History:  Diagnosis Date  . ADHD (attention deficit hyperactivity disorder)    no current med.  . Asthma    prn inhaler  . Depression   . Migraines   . 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 OCD (obsessive compulsive disorder)   . Oppositional defiant disorder   . PTSD (post-traumatic stress disorder)   . Stuffy nose 07/01/2017    Past Surgical History:  Procedure Laterality Date  . GANGLION CYST EXCISION Right 07/03/2017   Procedure: RIGHT HAND NEUROMA REMOVAL;  Surgeon: Leandrew Koyanagi, MD;  Location: [place];  Service: Orthopedics;  Laterality: Right;  . neuroma of hand removed    . TENDON LENGTHENING Bilateral    as a child  . TOOTH EXTRACTION      Family Psychiatric History: See below  Family History:  Family History  Problem Relation Age of Onset  . Bipolar disorder Cousin   . Alcohol abuse Paternal Aunt   . Drug abuse Paternal Aunt     Social History:  Social History   Socioeconomic History  . Marital status: Single    Spouse name: None  . Number of children: None  . Years of education: None  . Highest education level: None  Social Needs  .  Financial resource strain: None  . Food insecurity - worry: None  . Food insecurity - inability: None  . Transportation needs - medical: None  . Transportation needs - non-medical: None  Occupational History  . None  Tobacco Use  . Smoking status: Former Smoker    Years: 0.00    Last attempt to quit: 06/19/2013    Years since quitting: 4.3  . Smokeless tobacco: Never Used  Substance and Sexual Activity  . Alcohol use: No    Frequency: Never  . Drug use: No  . Sexual activity: Yes    Partners: Female  Other Topics Concern  . None  Social History Narrative  . None    Allergies:  Allergies  Allergen Reactions  . Strattera [Atomoxetine Hcl] Other (See Comments)    CAUSED AGGRESSION  . Sulfa Antibiotics Hives     Metabolic Disorder Labs: Lab Results  Component Value Date   HGBA1C  05/13/2008    5.2 (NOTE)   The ADA recommends the following therapeutic goal for glycemic   control related to Hgb A1C measurement:   Goal of Therapy:   < 7.0% Hgb A1C   Reference: American Diabetes Association: Clinical Practice   Recommendations 2008, Diabetes Care,  2008, 31:(Suppl 1).   MPG 103 05/13/2008   No results found for: PROLACTIN Lab Results  Component Value Date   CHOL 244 (H) 08/01/2017   TRIG 135 08/01/2017   HDL 42 08/01/2017   CHOLHDL 5.8 (H) 08/01/2017   VLDL 32 09/22/2013   LDLCALC 175 (H) 08/01/2017   LDLCALC 161 (H) 09/22/2013   Lab Results  Component Value Date   TSH 0.774 08/01/2017   TSH 0.862 10/01/2015    Therapeutic Level Labs: No results found for: LITHIUM Lab Results  Component Value Date   VALPROATE 79.6 05/12/2008   No components found for:  CBMZ  Current Medications: Current Outpatient Medications  Medication Sig Dispense Refill  . albuterol (PROVENTIL HFA;VENTOLIN HFA) 108 (90 Base) MCG/ACT inhaler Inhale every 6 (six) hours as needed into the lungs for wheezing or shortness of breath.    Marland Kitchen ibuprofen (ADVIL,MOTRIN) 600 MG tablet Take 1,200 mg every 6 (six) hours as needed by mouth for headache or moderate pain.     Marland Kitchen lisdexamfetamine (VYVANSE) 40 MG capsule Take 1 capsule (40 mg total) by mouth every morning. 30 capsule 0  . lisdexamfetamine (VYVANSE) 40 MG capsule Take 1 capsule (40 mg total) by mouth every morning. 30 capsule 0  . lisdexamfetamine (VYVANSE) 40 MG capsule Take 1 capsule (40 mg total) by mouth daily. 30 capsule 0  . loratadine (CLARITIN) 10 MG tablet Take 10 mg daily as needed by mouth for allergies.    Marland Kitchen HYDROcodone-acetaminophen (NORCO) 5-325 MG tablet Take 1-2 tablets every 6 (six) hours as needed by mouth. (Patient not taking: Reported on 10/30/2017) 30 tablet 0   No current facility-administered medications for this visit.       Musculoskeletal: Strength & Muscle Tone: within normal limits Gait & Station: normal Patient leans: N/A  Psychiatric Specialty Exam: Review of Systems  Constitutional: Positive for malaise/fatigue.  All other systems reviewed and are negative.   Blood pressure (!) 149/96, pulse 80, height 5\' 6"  (1.676 m), weight 204 lb (92.5 kg), SpO2 99 %.Body mass index is 32.93 kg/m.  General Appearance: Casual and Fairly Groomed  Eye Contact:  Good  Speech:  Clear and Coherent  Volume:  Normal  Mood:  Euthymic  Affect:  Congruent  Thought Process:  Goal Directed  Orientation:  Full (Time, Place, and Person)  Thought Content: WDL   Suicidal Thoughts:  No  Homicidal Thoughts:  No  Memory:  Immediate;   Good Recent;   Good Remote;   Fair  Judgement:  Fair  Insight:  Fair  Psychomotor Activity:  Normal  Concentration:  Concentration: Good and Attention Span: Good  Recall:  Good  Fund of Knowledge: Good  Language: Good  Akathisia:  No  Handed:  Right  AIMS (if indicated): not done  Assets:  Communication Skills Desire for Improvement Physical Health Resilience Social Support Talents/Skills  ADL's:  Intact  Cognition: WNL  Sleep:  Good   Screenings:   Assessment and Plan: Patient is a 27 year old male with a history of ADHD.  He is doing well on Vyvanse 40 mg daily.  He will continue on this dosage and return to see me in 3 months   Levonne Spiller, MD 10/30/2017, 2:29 PM

## 2017-10-31 ENCOUNTER — Ambulatory Visit: Payer: Medicaid Other | Admitting: Family Medicine

## 2017-10-31 ENCOUNTER — Encounter: Payer: Self-pay | Admitting: Family Medicine

## 2017-10-31 ENCOUNTER — Ambulatory Visit (HOSPITAL_COMMUNITY): Payer: Medicaid Other | Admitting: Licensed Clinical Social Worker

## 2017-10-31 VITALS — BP 128/82 | Ht 66.0 in | Wt 207.2 lb

## 2017-10-31 DIAGNOSIS — F39 Unspecified mood [affective] disorder: Secondary | ICD-10-CM | POA: Diagnosis not present

## 2017-10-31 DIAGNOSIS — E785 Hyperlipidemia, unspecified: Secondary | ICD-10-CM

## 2017-10-31 DIAGNOSIS — F902 Attention-deficit hyperactivity disorder, combined type: Secondary | ICD-10-CM | POA: Diagnosis not present

## 2017-10-31 DIAGNOSIS — G479 Sleep disorder, unspecified: Secondary | ICD-10-CM | POA: Diagnosis not present

## 2017-10-31 NOTE — Progress Notes (Signed)
   Subjective:    Patient ID: [name], male    DOB: [birth year], 27 y.o.   MRN: [id number]  HPI  Patient arrives to discuss possible motion sickness. The patient relates that when he is driving he at times feels like things are moving when they are not.  Other times he feels he like there is a sensation in his head when he is breaking.  He denies any Rexer injuries.  Denies any true vertigo.  Denies double vision nausea or vomiting.  The patient does have ADD does not take his medication on a regular basis.  Does have a history of behavioral health problems but states those been doing well recently  Patient also relates times where he feels tired during the day or sleepy during the day he had a sleep study done approximately 2 years ago which was negative.  Patient states he does not feel like he sleeps well.  He states he wakes up in the morning fatigued and tired.  He states at times he feels like his eyelids want to close but then they do not during the day this is troubling him.  He relates a lot of fatigue as a result.  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  HENT: Negative for congestion and rhinorrhea.   Respiratory: Negative for cough and shortness of breath.   Cardiovascular: Negative for chest pain and leg swelling.  Gastrointestinal: Negative for abdominal pain, diarrhea and nausea.  Genitourinary: Negative for dysuria and hematuria.  Neurological: Negative for weakness and headaches.  Psychiatric/Behavioral: Negative for behavioral problems.       Objective:   Physical Exam  Constitutional: He appears well-nourished. No distress.  HENT:  Head: Normocephalic and atraumatic.  Eyes: Right eye exhibits no discharge. Left eye exhibits no discharge.  Neck: No tracheal  deviation present.  Cardiovascular: Normal rate, regular rhythm and normal heart sounds.  No murmur heard. Pulmonary/Chest: Effort normal and breath sounds normal. No respiratory distress. He has no wheezes.  Musculoskeletal: He exhibits no edema.  Lymphadenopathy:    He has no cervical adenopathy.  Neurological: He is alert.  Skin: Skin is warm. No rash noted.  Psychiatric: His behavior is normal.  Vitals reviewed.         Assessment & Plan:  Underlying sleep disorder is a strong possibility. Patient had sleep study in 2017 which was negative for sleep apnea May have a sleep cycle disorder Needs to see sleep medicine specialist Patient also has not been taking his ADD medicine.  I have encouraged him to start taking it on a regular basis to help him with focus as well as alertness when he is working- patient was cautioned that if he finds himself sleepy driving needs to avoid driving  Hyperlipidemia recheck lab work again in 3 months time of follow-up office visit healthy diet recommended
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
## 2017-10-31 NOTE — Patient Instructions (Addendum)
Do your labs in early June and follow up in mid June    High Cholesterol High cholesterol is a condition in which the blood has high levels of a white, waxy, fat-like substance (cholesterol). The human body needs small amounts of cholesterol. The liver makes all the cholesterol that the body needs. Extra (excess) cholesterol comes from the food that we eat. Cholesterol is carried from the liver by the blood through the blood vessels. If you have high cholesterol, deposits (plaques) may build up on the walls of your blood vessels (arteries). Plaques make the arteries narrower and stiffer. Cholesterol plaques increase your risk for heart attack and stroke. Work with your health care provider to keep your cholesterol levels in a healthy range. What increases the risk? This condition is more likely to develop in people who:  Eat foods that are high in animal fat (saturated fat) or cholesterol.  Are overweight.  Are not getting enough exercise.  Have a family history of high cholesterol.  What are the signs or symptoms? There are no symptoms of this condition. How is this diagnosed? This condition may be diagnosed from the results of a blood test.  If you are older than age 27, your health care provider may check your cholesterol every 4-6 years.  You may be checked more often if you already have high cholesterol or other risk factors for heart disease.  The blood test for cholesterol measures:  "Bad" cholesterol (LDL cholesterol). This is the main type of cholesterol that causes heart disease. The desired level for LDL is less than 100.  "Good" cholesterol (HDL cholesterol). This type helps to protect against heart disease by cleaning the arteries and carrying the LDL away. The desired level for HDL is 60 or higher.  Triglycerides. These are fats that the body can store or burn for energy. The desired number for triglycerides is lower than 150.  Total cholesterol. This is a measure  of the total amount of cholesterol in your blood, including LDL cholesterol, HDL cholesterol, and triglycerides. A healthy number is less than 200.  How is this treated? This condition is treated with diet changes, lifestyle changes, and medicines. Diet changes  This may include eating more whole grains, fruits, vegetables, nuts, and fish.  This may also include cutting back on red meat and foods that have a lot of added sugar. Lifestyle changes  Changes may include getting at least 40 minutes of aerobic exercise 3 times a week. Aerobic exercises include walking, biking, and swimming. Aerobic exercise along with a healthy diet can help you maintain a healthy weight.  Changes may also include quitting smoking. Medicines  Medicines are usually given if diet and lifestyle changes have failed to reduce your cholesterol to healthy levels.  Your health care provider may prescribe a statin medicine. Statin medicines have been shown to reduce cholesterol, which can reduce the risk of heart disease. Follow these instructions at home: Eating and drinking  If told by your health care provider:  Eat chicken (without skin), fish, veal, shellfish, ground [place] breast, and round or loin cuts of red meat.  Do not eat fried foods or fatty meats, such as hot dogs and salami.  Eat plenty of fruits, such as apples.  Eat plenty of vegetables, such as broccoli, potatoes, and carrots.  Eat beans, peas, and lentils.  Eat grains such as barley, rice, couscous, and bulgur wheat.  Eat pasta without cream sauces.  Use skim or nonfat milk, and eat  low-fat or nonfat yogurt and cheeses.  Do not eat or drink whole milk, cream, ice cream, egg yolks, or hard cheeses.  Do not eat stick margarine or tub margarines that contain trans fats (also called partially hydrogenated oils).  Do not eat saturated tropical oils, such as coconut oil and palm oil.  Do not eat cakes, cookies, crackers, or other baked  goods that contain trans fats.  General instructions  Exercise as directed by your health care provider. Increase your activity level with activities such as gardening, walking, and taking the stairs.  Take over-the-counter and prescription medicines only as told by your health care provider.  Do not use any products that contain nicotine or tobacco, such as cigarettes and e-cigarettes. If you need help quitting, ask your health care provider.  Keep all follow-up visits as told by your health care provider. This is important. Contact a health care provider if:  You are struggling to maintain a healthy diet or weight.  You need help to start on an exercise program.  You need help to stop smoking. Get help right away if:  You have chest pain.  You have trouble breathing. This information is not intended to replace advice given to you by your health care provider. Make sure you discuss any questions you have with your health care provider. Document Released: 08/06/2005 Document Revised: 03/03/2016 Document Reviewed: 02/04/2016 Elsevier Interactive Patient Education  [employer].

## 2017-11-01 ENCOUNTER — Encounter (HOSPITAL_COMMUNITY): Payer: Self-pay | Admitting: Licensed Clinical Social Worker

## 2017-11-11 ENCOUNTER — Ambulatory Visit: Payer: Medicaid Other | Admitting: Neurology

## 2017-11-11 ENCOUNTER — Encounter: Payer: Self-pay | Admitting: Neurology

## 2017-11-11 VITALS — BP 139/89 | HR 72 | Ht 66.0 in | Wt 203.0 lb

## 2017-11-11 DIAGNOSIS — R0683 Snoring: Secondary | ICD-10-CM

## 2017-11-11 DIAGNOSIS — G4719 Other hypersomnia: Secondary | ICD-10-CM

## 2017-11-11 DIAGNOSIS — G4721 Circadian rhythm sleep disorder, delayed sleep phase type: Secondary | ICD-10-CM | POA: Diagnosis not present

## 2017-11-11 DIAGNOSIS — J4520 Mild intermittent asthma, uncomplicated: Secondary | ICD-10-CM | POA: Diagnosis not present

## 2017-11-11 NOTE — Progress Notes (Signed)
SLEEP MEDICINE CLINIC   Provider:  Larey Seat, [place] D  Primary Care Physician:  Kathyrn Drown, MD   Referring Provider: Kathyrn Drown, MD   Chief Complaint  Patient presents with  . New Patient (Initial Visit)    rm 11, pt alone, pt states that he has noticed more when he is driving he feels like he is exhausted and tired and it can become hard to keep eyes open. when he gets home he sits down and then passes out. sleep study was completed in 2017, and was negative for sleep apnea. PCP questioned narcolepsy    HPI:  [name] is a 27 y.o. male , seen here in a referral  from Dr. Wolfgang Phoenix for hypersomnia.   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 He lives with his mother and his 69 year old daughter and 83 year old son,  she noted him falling asleep when he is not physically active or mentally stimulated. His wife is not living with them.  We are meeting today for a sleep consultation on 11 November 2017, Mr. 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 Today he works from 5 to 9 PM.  On a Saturday or Friday night however he may work as long as 1 AM. He commutes 15 minutes.   Chief complaint according to patient :" I am sleepy" he has been sleepy all his adult live, but got worse when he started late shift work.   Sleep habits are as follows: he goes to bed between midnight and 3 AM on week ends, and on week days between 12 and 1 AM.  He describes his bedroom as cool, quiet and dark.  He may watch TV before he goes to bed, but he does not watch TV in bed.  Usually he is asleep very promptly, he usually prefers to sleep on his left side with one pillow for head support.  He describes himself as a hard deep sleeper and sleeps through the night no  reported nocturia.  Girlfriend has noted him to snore but a sleep test 2 years ago was negative for apnea.  The test was performed at [facility] sleep lab. He has struggled with sleepiness. He dreams, but is not bothered by them. No nightmares. Some times he has palpitations and diaphoresis, dreaming he overslept.  Alarm is set for 6.45 and on weekends at 9 AM.  Feels refreshed every morning, but within an hour he could sleep again.  He takes naps and power nap which refreshes him more. Longer naps  Cause a temporal headache, bilaterally. He has a very dry mouth and postnasal drip.    Sleep medical history and family sleep history:  Father had Sleep apnea, OSA.   Social history: separated form the mother of his 2 children, but has sole custody. Lives with his mother , children and his girlfriend often visits. Non tobacco use, no marihuana use. Alcohol - likes Shearon Stalls and drinks on some week ends.  Caffeine: soda - 1 a day, coffee - rarely, iced tea - 3-4 glasses a day with lunch or dinner.      Review of Systems: Out of a complete 14 system review, the  patient complains of only the following symptoms, and all other reviewed systems are negative.  Early dream onset, no sleep paralysis. Dreams in naps.  No cataplexy.  Epworth score 19/ 24  , Fatigue severity score N/a   , depression score n/a    Social History   Socioeconomic History  . Marital status: Single    Spouse name: Not on file  . Number of children: Not on file  . Years of education: Not on file  . Highest education level: Not on file  Occupational History  . Not on file  Social Needs  . Financial resource strain: Not on file  . Food insecurity:    Worry: Not on file    Inability: Not on file  . Transportation needs:    Medical: Not on file    Non-medical: Not on file  Tobacco Use  . Smoking status: Former Smoker    Years: 0.00    Last attempt to quit: 06/19/2013    Years since quitting: 4.4  . Smokeless  tobacco: Former Systems developer    Types: [place] date: 02/12/2016  Substance and Sexual Activity  . Alcohol use: Yes    Frequency: Never    Comment: occasional  . Drug use: No  . Sexual activity: Yes    Partners: Female  Lifestyle  . Physical activity:    Days per week: Not on file    Minutes per session: Not on file  . Stress: Not on file  Relationships  . Social connections:    Talks on phone: Not on file    Gets together: Not on file    Attends religious service: Not on file    Active member of club or organization: Not on file    Attends meetings of clubs or organizations: Not on file    Relationship status: Not on file  . Intimate partner violence:    Fear of current or ex partner: Not on file    Emotionally abused: Not on file    Physically abused: Not on file    Forced sexual activity: Not on file  Other Topics Concern  . Not on file  Social History Narrative  . Not on file    Family History  Problem Relation Age of Onset  . Bipolar disorder Cousin   . Alcohol abuse Paternal Aunt   . Drug abuse Paternal Aunt   . Diabetes Mother   . Heart attack Father   . Cancer Father     Past Medical History:  Diagnosis Date  . ADHD (attention deficit hyperactivity disorder)    no current med.  . Asthma    prn inhaler  . Depression   . Migraines   . 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 OCD (obsessive compulsive disorder)   . Oppositional defiant disorder   . PTSD (post-traumatic stress disorder)   . Stuffy nose 07/01/2017    Past Surgical History:  Procedure Laterality Date  . GANGLION CYST EXCISION Right 07/03/2017   Procedure: RIGHT HAND NEUROMA REMOVAL;  Surgeon: Leandrew Koyanagi, MD;  Location: [place];  Service: Orthopedics;  Laterality: Right;  . neuroma of hand removed    . TENDON LENGTHENING Bilateral    as a child  . TOOTH EXTRACTION      Current Outpatient Medications  Medication Sig Dispense Refill  . albuterol (PROVENTIL HFA;VENTOLIN  HFA) 108 (90 Base) MCG/ACT inhaler Inhale every 6 (six) hours as needed into the lungs  for wheezing or shortness of breath.    Marland Kitchen ibuprofen (ADVIL,MOTRIN) 600 MG tablet Take 1,200 mg every 6 (six) hours as needed by mouth for headache or moderate pain.     Marland Kitchen loratadine (CLARITIN) 10 MG tablet Take 10 mg daily as needed by mouth for allergies.    Marland Kitchen lisdexamfetamine (VYVANSE) 40 MG capsule Take 1 capsule (40 mg total) by mouth every morning. (Patient not taking: Reported on 11/11/2017) 30 capsule 0  . lisdexamfetamine (VYVANSE) 40 MG capsule Take 1 capsule (40 mg total) by mouth every morning. (Patient not taking: Reported on 11/11/2017) 30 capsule 0  . lisdexamfetamine (VYVANSE) 40 MG capsule Take 1 capsule (40 mg total) by mouth daily. (Patient not taking: Reported on 11/11/2017) 30 capsule 0   No current facility-administered medications for this visit.     Allergies as of 11/11/2017 - Review Complete 11/11/2017  Allergen Reaction Noted  . Strattera [atomoxetine hcl] Other (See Comments) 08/06/2012  . Sulfa antibiotics Hives 07/01/2017    Vitals: BP 139/89   Pulse 72   Ht _0  (1.676 m)   Wt 203 lb (92.1 kg)   BMI 32.77 kg/m  Last Weight:  Wt Readings from Last 1 Encounters:  11/11/17 203 lb (92.1 kg)   AYO:KHTX mass index is 32.77 kg/m.     Last Height:   Ht Readings from Last 1 Encounters:  11/11/17 _1  (1.676 m)    Physical exam:  General: The patient is awake, alert and appears not in acute distress. The patient has facial hair. Head: Normocephalic, atraumatic. Neck is supple. Mallampati 2- puffy uvula but lifts above tongue ground.   neck circumference:16. Nasal airflow restricted - congested ,  Retrognathia is not seen.  Cardiovascular:  Regular rate and rhythm , without  murmurs or carotid bruit, and without distended neck veins. Respiratory: Lungs are clear to auscultation. Skin:  Without evidence of edema, or rash Trunk: BMI is elevated . The patient's posture is  erect.   Neurologic exam : The patient is awake and alert, oriented to place and time.    Attention span & concentration ability appears normal.  Speech is fluent,  without dysarthria, dysphonia or aphasia.  Mood and affect are appropriate.  Cranial nerves: Pupils are equal and briskly reactive to light. Funduscopic exam without evidence of pallor or edema. Extraocular movements  in vertical and horizontal planes intact and without nystagmus. Visual fields by finger perimetry are intact. Hearing to finger rub intact.  Facial sensation intact to fine touch. Facial motor strength is symmetric and tongue and uvula move midline. Shoulder shrug was symmetrical.   Motor exam: Normal tone, muscle bulk and symmetric strength in all extremities. Sensory:  Fine touch, pinprick and vibration were tested in all extremities. Proprioception tested in the upper extremities was normal. Coordination: Rapid alternating movements in the fingers/hands was normal. Finger-to-nose maneuver  normal without evidence of ataxia, dysmetria or tremor. Gait and station: Patient walks without assistive device and is able unassisted to climb up to the exam table. Strength within normal limits.  Stance is stable and normal. Turns with 3 Steps.  Deep tendon reflexes: in the upper and lower extremities are attenuated -symmetrically . Babinski maneuver response is downgoing.  Assessment:  After physical and neurologic examination, review of laboratory studies,  Personal review of imaging studies, reports of other /same  Imaging studies, results of polysomnography and / or neurophysiology testing and pre-existing records as far as provided in visit., my assessment is  1) the patient reports symptoms of ADHD that affected him during his school years, but he has no longer taking ADHD medication.  At this time he is less impulsive and predominantly more inattentive.  He has tried Vyvanse just last week again and he did not like how  he felt on it.  Vyvanse did not give him a release of impulsivity, he rather felt as if it put him under pressure or made him more jittery.    2) Mr. Gater has excessive daytime sleepiness and thought some forms of dream intrusion, isolated sleep paralysis but she does not endorse any cataplexy even in forme fruste.. His previous sleep study  3) was performed that) and reviewed here today, he was found to be snoring but did not have significant apnea I was a little bit concerned that there are so many zeros in his test results.  0 CO2 evaluation 0 oxygen desaturation 0.  He reached 16.1% REM sleep, he actually had a lot of deep sleep and N3 sleep was 46.5%, he had 4 REM.  This was a REM latency of 86 minutes, sleep efficiency was 87%.  The total AHI was 2.6.  RDI was the same.  I did not see the physician interpretation report of the study.  Only the technical reports.   The patient was advised of the nature of the diagnosed disorder , the treatment options and the  risks for general health and wellness arising from not treating the condition.   I spent more than 45 minutes of face to face time with the patient.  Greater than 50% of time was spent in counseling and coordination of care. We have discussed the diagnosis and differential and I answered the patient's questions.    Plan:  Treatment plan and additional workup :  Mr. Keesling social life and work life have significantly changed since he had his last sleep study in which he only endorsed the Epworth sleepiness score at 5 points.  Today he endorses 19 points, is a late shift worker and a single parent.  He uses a lot of energy drinks and caffeinated sodas and ice tea to stay awake.  He is not using any antidepressants, pain medication or muscle relaxers.  He would be allowed to continue taking ibuprofen as needed or an antihistamine for nasal congestion but I will order an M SLT following PSG for him now based on his high degree of daytime  sleepiness.  In addition I will order an HLA  narcolepsy test.   The MSLT and PSG will be ordered for a time later than 4-21 -2018.    Larey Seat, MD 3/82/5053, 9:76 AM  Certified in Neurology by ABPN Certified in [place] by [facility] [address] [place], [place] [address]

## 2017-11-11 NOTE — Patient Instructions (Signed)
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 We may have to try different medications that may help you stay awake during the day. Not everything works with everybody the same way. Wake promoting agents include stimulants and non-stimulant type medications. The most common side effects with stimulants are weight loss, insomnia, nervousness, headaches, palpitations, rise in blood pressure, anxiety. Stimulants can be addictive and subject to abuse. Non-stimulant type wake promoting medications include Provigil and Nuvigil, most common side effects include headaches, nervousness, insomnia, hypertension. In addition there is a medication called Xyrem which has been proven to be very effective in patients with narcolepsy with or without cataplexy. Some patients with narcolepsy report episodes of weakness, such as jaw or facial weakness, legs giving out, feeling wobbly or like "Jell-o", etc. in situations of anxiety, stress, laughter, sudden sadness, surprise, etc., which is called cataplexy. You can also experience episodes of sleep paralysis during which you may feel unable to move upon awakening. Some people experience dreamlike sequences upon awakening or upon drifting off to sleep, called hypnopompic or hypnagogic hallucinations.     Hypersomnia Hypersomnia is when you feel extremely tired during the day even though you're getting plenty of sleep at night. You may need to take naps during the day, and you may also be extremely difficult to wake up when you are sleeping. What are the causes? The cause of your hypersomnia may not be known. Hypersomnia may be caused by:  Medicines.  Sleep disorders, such as narcolepsy.  Trauma or injury to your head or nervous system.  Using drugs or alcohol.  Tumors.  Medical conditions, such as  depression or hypothyroidism.  Genetics.  What are the signs or symptoms? The main symptoms of hypersomnia include:  Feeling extremely tired throughout the day.  Being very difficult to wake up.  Sleeping for longer and longer periods.  Taking naps throughout the day.  Other symptoms may include:  Feeling: ? Restless. ? Annoyed. ? Anxious. ? Low energy.  Having difficulty: ? Remembering. ? Speaking. ? Thinking.  Losing your appetite.  Experiencing hallucinations.  How is this diagnosed? Hypersomnia may be diagnosed by:  Medical history and physical exam. This will include a sleep history.  Completing sleep logs.  Tests may also be done, such as: ? Polysomnography. ? Multiple sleep latency test (MSLT).  How is this treated? There is no cure for hypersomnia, but treatment can be very effective in helping manage the condition. Treatment may include:  Lifestyle and sleeping strategies to help cope with the condition.  Stimulant medicines.  Treating any underlying causes of hypersomnia.  Follow these instructions at home:  Take medicines only as directed by your health care provider.  Schedule short naps for when you feel sleepiest during the day. Tell your employer or teachers that you have hypersomnia. You may be able to adjust your schedule to include time for naps.  Avoid drinking alcohol or caffeinated beverages.  Do not eat a heavy meal before bedtime. Eat at about the same times every day.  Do not drive or operate heavy machinery if you are sleepy.  Do not swim or go out on the water without a life jacket.  If possible, adjust your schedule so that you do not have to work or be active at night.  Keep all follow-up visits as directed by your  health care provider. This is important. Contact a health care provider if:  You have new symptoms.  Your symptoms get worse. Get help right away if: You have serious thoughts of hurting yourself or  someone else. This information is not intended to replace advice given to you by your health care provider. Make sure you discuss any questions you have with your health care provider. Document Released: 07/27/2002 Document Revised: 01/12/2016 Document Reviewed: 03/11/2014 Elsevier Interactive Patient Education  [employer].

## 2017-11-14 LAB — NARCOLEPSY EVALUATION
DQA1*01:02: POSITIVE
HLA-DQ BETA: NEGATIVE

## 2017-11-19 ENCOUNTER — Telehealth: Payer: Self-pay | Admitting: Neurology

## 2017-11-19 NOTE — Telephone Encounter (Signed)
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 Patient is scheduled for his sleep tests in may.  Pt verbalized understanding. Pt had no questions at this time but was encouraged to call back if questions arise.

## 2017-11-19 NOTE — Telephone Encounter (Signed)
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 MSLT ordered.

## 2017-12-04 ENCOUNTER — Ambulatory Visit (HOSPITAL_COMMUNITY): Payer: Medicaid Other | Admitting: Licensed Clinical Social Worker

## 2017-12-04 DIAGNOSIS — F39 Unspecified mood [affective] disorder: Secondary | ICD-10-CM | POA: Diagnosis not present

## 2017-12-09 ENCOUNTER — Encounter (HOSPITAL_COMMUNITY): Payer: Self-pay | Admitting: Licensed Clinical Social Worker

## 2017-12-09 NOTE — Progress Notes (Signed)
   THERAPIST PROGRESS NOTE  Session Time: 3-4  Participation Level: Active  Behavioral Response: Casual and Well GroomedAlertEuthymic  Type of Therapy: Family Therapy  Treatment Goals addressed: Coping  Interventions: CBT, Psychosocial Skills: Parenting, boundary setting and Family Systems  Summary: [name] is a 27 y.o. male who presents with his mother in session as part of a joint session to attempt to work on increased functioning in household. Pt reports he and his mother have been struggling to co-parent pt's 2 toddlers. Pt states he and his mother get very upset w/ children when they defecate in their pants or in places other than toilet. Pt and mother both state they lose their temper frequently and believe that the 27 yo boy is "doing it on purpose". Counselor spends time coaching pt and mother on parenting skills, maintaining calm in the midst of a child's tantrum and/or emotional outburst.   Pt continues to report he and his mother have difficulties communicating w/ each other and "often do not really pay attention to what the other is saying". Counselor suggested that pt and mother could each "ask for a response back when they have attempted to communicate something". Pt's mother becomes very tearful throughout session and states she "really wishes it could be different" (parenting and communication styles). Counselor asks pt's mother what she would like to be doing and she responds she wishes she could "live her own life and not have to take care of the children each night that pt works".  Suicidal/Homicidal: Nowithout intent/plan  Therapist Response: Counselor used family counseling, open questions, and feedback. Counselor used psychoeducation and coaching to increase pt and pt's mother understanding of appropriate parenting reactions and responses to "problematic bx" on the part of the child. Counselor encouraged pt to slow down when he is asking something of his mother  and "make sure you are seeing a response from her, if you don't see it then ask for it".   Plan: Return again in 4 weeks.  Diagnosis:    ICD-10-CM   1. Unspecified mood (affective) disorder [facility]) [place], LCAS-A 12/09/2017

## 2017-12-23 ENCOUNTER — Ambulatory Visit (INDEPENDENT_AMBULATORY_CARE_PROVIDER_SITE_OTHER): Payer: Medicaid Other | Admitting: Neurology

## 2017-12-23 DIAGNOSIS — G4733 Obstructive sleep apnea (adult) (pediatric): Secondary | ICD-10-CM | POA: Diagnosis not present

## 2017-12-23 DIAGNOSIS — R0683 Snoring: Secondary | ICD-10-CM

## 2017-12-23 DIAGNOSIS — G4721 Circadian rhythm sleep disorder, delayed sleep phase type: Secondary | ICD-10-CM

## 2017-12-23 DIAGNOSIS — J4520 Mild intermittent asthma, uncomplicated: Secondary | ICD-10-CM

## 2017-12-23 DIAGNOSIS — G4719 Other hypersomnia: Secondary | ICD-10-CM

## 2017-12-23 DIAGNOSIS — G4731 Primary central sleep apnea: Secondary | ICD-10-CM

## 2017-12-31 ENCOUNTER — Telehealth: Payer: Self-pay | Admitting: Neurology

## 2017-12-31 NOTE — Addendum Note (Signed)
Addended by: Larey Seat on: 12/31/2017 08:33 AM   Modules accepted: Orders

## 2017-12-31 NOTE — Telephone Encounter (Signed)
I called pt. I advised pt that Dr. 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 Dr. Brett Fairy recommends that pt return for a repeat sleep study in order to properly titrate the cpap and ensure a good mask fit. Pt is agreeable to returning for a titration study. I advised pt that our sleep lab will file with pt's insurance and call pt to schedule the sleep study when we hear back from the pt's insurance regarding coverage of this sleep study. In the meantime I have also reviewed with the pt ways to sleep on side vs sleeping on his back since the apnea was worse on back as well as weight loss. Pt verbalized understanding of results. Pt had no questions at this time but was encouraged to call back if questions arise.

## 2017-12-31 NOTE — Telephone Encounter (Signed)
-----   Message from Larey Seat, MD sent at 12/31/2017  8:33 AM EDT ----- Central Sleep Apnea (CSA), sparing REM sleep but exacerbated  during supine sleep. There was a strong positional component with  supine AHI of 69.2, non- supine AHI of 4.6. 2. Hypersomnia may be explained by this central apnea and  therefore central apnea should be treated.  RECOMMENDATIONS:  1. Advise to not sleep in supine position and to lose weight.  This will also address the snoring. 2. A full-night, attended, PAP titration study is recommended to  optimize therapy.  3. One apnea is treated for 30-60 days, re-evaluate for degree of  daytime sleepiness( Epworth) and consider narcolepsy work up.

## 2017-12-31 NOTE — Procedures (Signed)
PATIENT'S NAME:  [name], [name] DOB:      [birth year]      MR#:    [id number]     DATE OF RECORDING: 12/23/2017 REFERRING M.D.:  Sallee Lange, M.D. 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 On a Saturday or Friday night he may work as long as 1 AM. He is excessively sleepy in daytime, to a degree that warrants further evaluation for organic sleep disorders. He naps and reports dreaming during short naps, but reported neither cataplexy nor sleep paralysis. He is using a stimulant medication.   The patient endorsed the Epworth Sleepiness Scale at 19/ 24 points.   The patient's weight 203 pounds with a height of 66 (inches), resulting in a BMI of 32.6 kg/m2. The patient's neck circumference measured 16 inches.  CURRENT MEDICATIONS: Proventil, Advil, Claritin, Vyvanse   PROCEDURE:  This is a multichannel digital polysomnogram utilizing the SomnoStar 11.2 system.  Electrodes and sensors were applied and monitored per AASM Specifications.   EEG, EOG, Chin and Limb EMG, were sampled at 200 Hz.  ECG, Snore and Nasal Pressure, Thermal Airflow, Respiratory Effort, CPAP Flow and Pressure, Oximetry was sampled at 50 Hz. Digital video and audio were recorded.      BASELINE STUDY Lights Out was at 20:57 and Lights On at 04:59.  Total recording time (TRT) was 483 minutes, with a total sleep time (TST) of 431.5 minutes.   The patient's sleep latency was 33.5 minutes.  REM latency was 106 minutes.  The sleep efficiency was 89.3 %.     SLEEP ARCHITECTURE: WASO (Wake after sleep onset) was 24.5 minutes.  There were 49 minutes in Stage N1, 123.5 minutes Stage N2, 193.5 minutes Stage N3 and 65.5 minutes in Stage REM.  The percentage of Stage N1 was 11.4%, Stage N2 was 28.6%, Stage N3 was 44.8% and Stage  R (REM sleep) was 15.2%.   RESPIRATORY ANALYSIS:  There were a total of 117 respiratory events:  0 obstructive apneas, 71 central apneas and 6 mixed apneas with 40 hypopneas. The patient also had 0 respiratory event related arousals (RERAs).     The total APNEA/HYPOPNEA INDEX (AHI) was 16.3/hour and the total RESPIRATORY DISTURBANCE INDEX was 16.3 /hour.  7 events occurred in REM sleep and 144 events in NREM. The REM AHI was 6.4 /hour, versus a non-REM AHI of 18.0/h. The patient spent 78 minutes of total sleep time in the supine position and 354 minutes in non-supine. The supine AHI was 69.2 versus a non-supine AHI of 4.6.  OXYGEN SATURATION & C02:  The Wake baseline 02 saturation was 94%, with the lowest being 80%. Time spent below 89% saturation equaled 30 minutes.   PERIODIC LIMB MOVEMENTS:  The patient had a total of 0 Periodic Limb Movements.   The arousals were noted as: 48 were spontaneous, 0 were associated with PLMs, and 74 were associated with respiratory events.  Audio and video analysis did not show any abnormal or unusual movements, behaviors, phonations or vocalizations.   No nocturia noted. Soft Snoring was noted. EKG was in keeping with normal sinus rhythm (NSR).  Post-study, the patient indicated that sleep was the same as usual.    IMPRESSION:  1. Central Sleep Apnea (CSA), sparing REM sleep but exacerbated  during supine sleep. There was a strong positional component with supine AHI of 69.2, non- supine AHI of 4.6. 2. Hypersomnia may be explained by this central apnea and therefore central apnea should be treated.  RECOMMENDATIONS:  1. Advise to not sleep in supine position and to lose weight. This will also address the snoring. 2. A full-night, attended, CPAP titration study is recommended to optimize therapy.  3. One apnea is treated for 30-60 days, re-evaluate for degree of daytime sleepiness( Epworth)  and consider narcolepsy work up.     I certify that I have  reviewed the entire raw data recording prior to the issuance of this report in accordance with the Standards of Accreditation of the American Academy of Sleep Medicine (AASM)      Larey Seat, MD     12-29-2017  Diplomat, American Board of Psychiatry and Neurology  Diplomat, American Board of [place] Director, [facility] [employer]

## 2018-01-02 NOTE — Addendum Note (Signed)
Addended by: Karle Barr on: 01/02/2018 10:41 AM   Modules accepted: Orders

## 2018-01-05 ENCOUNTER — Ambulatory Visit (INDEPENDENT_AMBULATORY_CARE_PROVIDER_SITE_OTHER): Payer: Medicaid Other | Admitting: Neurology

## 2018-01-05 DIAGNOSIS — G4721 Circadian rhythm sleep disorder, delayed sleep phase type: Secondary | ICD-10-CM

## 2018-01-05 DIAGNOSIS — G4733 Obstructive sleep apnea (adult) (pediatric): Secondary | ICD-10-CM | POA: Diagnosis not present

## 2018-01-05 DIAGNOSIS — G4731 Primary central sleep apnea: Secondary | ICD-10-CM

## 2018-01-05 DIAGNOSIS — R0683 Snoring: Secondary | ICD-10-CM

## 2018-01-05 DIAGNOSIS — G4719 Other hypersomnia: Secondary | ICD-10-CM

## 2018-01-08 ENCOUNTER — Telehealth: Payer: Self-pay | Admitting: Neurology

## 2018-01-08 NOTE — Addendum Note (Signed)
Addended by: Larey Seat on: 01/08/2018 12:58 PM   Modules accepted: Orders

## 2018-01-08 NOTE — Telephone Encounter (Signed)
  I called pt. I advised pt that Dr. Brett Fairy reviewed their sleep study results and found that pt has sleep. Dr. Brett Fairy recommends that pt starts an auto CPAP set at 6-10 cm of water pressure. I reviewed PAP compliance expectations with the pt. Pt is agreeable to starting a CPAP. I advised pt that an order will be sent to a DME, [facility], and [facility] will call the pt within about one week after they file with the pt's insurance. [facility] will show the pt how to use the machine, fit for masks, and troubleshoot the CPAP if needed. A follow up appt was made for insurance purposes with Ward Givens, NP on Mar 27, 2018 at 10:00 am. Pt verbalized understanding to arrive 15 minutes early and bring their CPAP. A letter with all of this information in it will be mailed to the pt as a reminder. I verified with the pt that the address we have on file is correct. Pt verbalized understanding of results. Pt had no questions at this time but was encouraged to call back if questions arise. .         Central Sleep Apnea resolved on CPAP of 8 cm water - The patient was fitted with a ResMed AirFit P10 medium size.    PLANS/RECOMMENDATIONS: Auto titration capable machine CPAP between 6 and 10 cm water, 2 cm EPR, and heated humidity. Use of a ResMed AirFit P10 medium nasal pillow. Revisit to see if Epworth score has been influenced by PAP therapy, otherwise pursue narcolepsy work up.

## 2018-01-08 NOTE — Procedures (Signed)
PATIENT'S NAME:  [name], Eves DOB:      July 17, 1991      MR#:    [id number]     DATE OF RECORDING: 01/05/2018 REFERRING M.D.:  Sallee Lange, M.D. Study Performed:   Titration to Positive Airway Pressure  HISTORY:  Mr. Szymborski was referred to evaluate Hypersomnia, and is returning for CPAP titration following PSG performed on 12/23/2017 which resulted in an overall AHI of 16.3/h , supine AHI of 69/h, and majority Central Sleep Apnea . His oxygen nadir was 80%. The patient endorsed the Epworth Sleepiness Scale at 19/ 24 points.   The patient's weight 203 pounds with a height of 66 (inches), resulting in a BMI of 32.6 kg/m2. The patient's neck circumference measured 16 inches.  CURRENT MEDICATIONS: Proventil, Advil, Claritin, Vyvanse   PROCEDURE:  This is a multichannel digital polysomnogram utilizing the SomnoStar 11.2 system.  Electrodes and sensors were applied and monitored per AASM Specifications.   EEG, EOG, Chin and Limb EMG, were sampled at 200 Hz.  ECG, Snore and Nasal Pressure, Thermal Airflow, Respiratory Effort, CPAP Flow and Pressure, Oximetry was sampled at 50 Hz. Digital video and audio were recorded.      CPAP was initiated at 5 cmH20 with heated humidity per AASM split night standards and pressure was advanced to 8/8cmH20 because of hypopneas, apneas and desaturations.  At a PAP pressure of 8 cmH20, there was a reduction of the AHI to 0.0 with improvement of snoring and sleep apnea. The patient was fitted with a ResMed AirFit P10 medium. Lights were out at 21:02 and Lights On at 05:01. Total recording time (TRT) was 480 minutes, with a total sleep time (TST) of 429.5 minutes. The patient's sleep latency was 34.5 minutes. REM latency was 99 minutes.  The sleep efficiency was 89.5 %.    SLEEP ARCHITECTURE: WASO (Wake after sleep onset) was 16 minutes.  There were 19.5 minutes in Stage N1, 131.5 minutes Stage N2, 191 minutes Stage N3 and 87.5 minutes in Stage REM.  The percentage of Stage  N1 was 4.5%, Stage N2 was 30.6%, Stage N3 was 44.5% and Stage R (REM sleep) was 20.4%.   RESPIRATORY ANALYSIS:  There was a total of 0 respiratory events: 0 obstructive apneas, 0 central apneas and 0 mixed apneas with a total of 0 apneas and an apnea index (AI) of 0 /hour. There were 0 hypopneas with a hypopnea index of 0/hour. The patient also had 0 respiratory event related arousals (RERAs).      The total APNEA/HYPOPNEA INDEX (AHI) was 00. /hour and the total RESPIRATORY DISTURBANCE INDEX was 0 0./hour  .The patient spent 170.5 minutes of total sleep time in the supine position and 259 minutes in non-supine. The supine AHI was 0.0, versus a non-supine AHI of 0.0.  OXYGEN SATURATION & C02:  The baseline 02 saturation was 94%, with the lowest being 84%. Time spent below 89% saturation equaled 1 minute.  PERIODIC LIMB MOVEMENTS:  The patient had a total of 0 Periodic Limb Movements.  The arousals were noted as: 43 were spontaneous, 0 were associated with PLMs, and 0 were associated with respiratory events.  Audio and video analysis did not show any abnormal or unusual movements, behaviors, phonations or vocalizations. There was limb movement during REM sleep.  Snoring was noted and alleviated at last pressure. EKG was in keeping with normal sinus rhythm (NSR). Post-study, the patient indicated that sleep was the same as usual.   DIAGNOSIS 1. Central Sleep Apnea resolved  on CPAP of 8 cm water - The patient was fitted with a ResMed AirFit P10 medium size.    PLANS/RECOMMENDATIONS: Auto titration capable machine CPAP between 6 and 10 cm water, 2 cm EPR, and heated humidity. Use of a ResMed AirFit P10 medium nasal pillow. Revisit to see if Epworth score has been influenced by PAP therapy, otherwise pursue narcolepsy work up.    A follow up appointment will be scheduled in the Sleep Clinic at [facility].   Please call [phone] with any questions.      I certify that I  have reviewed the entire raw data recording prior to the issuance of this report in accordance with the Standards of Accreditation of the American Academy of Sleep Medicine (AASM)      Larey Seat, M.D.    01-08-2018  Diplomat, American Board of Psychiatry and Neurology  Diplomat, [place] of Sleep Medicine Medical Director, [place] [facility] at [facility]

## 2018-01-09 ENCOUNTER — Ambulatory Visit (HOSPITAL_COMMUNITY): Payer: Medicaid Other | Admitting: Licensed Clinical Social Worker

## 2018-01-09 DIAGNOSIS — F39 Unspecified mood [affective] disorder: Secondary | ICD-10-CM | POA: Diagnosis not present

## 2018-01-16 ENCOUNTER — Encounter (HOSPITAL_COMMUNITY): Payer: Self-pay | Admitting: Licensed Clinical Social Worker
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
## 2018-01-30 ENCOUNTER — Encounter (HOSPITAL_COMMUNITY): Payer: Self-pay | Admitting: Psychiatry

## 2018-01-30 ENCOUNTER — Ambulatory Visit (HOSPITAL_COMMUNITY): Payer: Medicaid Other | Admitting: Psychiatry

## 2018-01-30 VITALS — BP 126/68 | HR 80 | Ht 66.0 in | Wt 205.0 lb

## 2018-01-30 DIAGNOSIS — Z87891 Personal history of nicotine dependence: Secondary | ICD-10-CM

## 2018-01-30 DIAGNOSIS — F902 Attention-deficit hyperactivity disorder, combined type: Secondary | ICD-10-CM

## 2018-01-30 DIAGNOSIS — Z813 Family history of other psychoactive substance abuse and dependence: Secondary | ICD-10-CM

## 2018-01-30 DIAGNOSIS — F39 Unspecified mood [affective] disorder: Secondary | ICD-10-CM

## 2018-01-30 DIAGNOSIS — Z811 Family history of alcohol abuse and dependence: Secondary | ICD-10-CM

## 2018-01-30 DIAGNOSIS — Z818 Family history of other mental and behavioral disorders: Secondary | ICD-10-CM

## 2018-01-30 NOTE — Progress Notes (Signed)
BH MD/PA/NP OP Progress Note  01/30/2018 11:35 AM [name]  MRN:  [id number]  Chief Complaint:  Chief Complaint    ADHD; Follow-up     HPI: patient is a 27 year oldseparatedwhite male who has been living with his Children in [place] He isworking delivering pizzas  His mother reports that he was always a very hyperactive child wouldn't listen and acted out. He was constantly in trouble in school. He started on Ritalin at an early age but it made him like a zombie and he lost a lot of weight. Over the years she's been on numerous stimulants. At age 27 his father took custody of him. He did not do well with his father and was very oppositional. He was eventually placed in a group home and went through 3 group homes to his teen years.  At age 27 he was arrested for continued contributing to do the deliquency of a minor. He was dating a 26 year old girl who ran away from home and was found with him he spent about 40 days in jail but he claims "turn my life around." Shortly after getting out of jail his father got ill with cancer and he spent a lot of time with him and was glad that they could reunite. The patient states he's currently doing well. He's under good control of his behavior. He's not particularly impulsive and is stable to stay focused.  The patient returns after 3 months.  He states that he has been off Vyvanse for most of this time.  He states that he keeps forgetting to go get it.  He is seeing a therapist in our [place] office.  He claims that he is doing well without the medication.  He is focusing at work and taking care of his children.  He does not think he needs it right now.  He denies being depressed anxious or suicidal. Visit Diagnosis:    ICD-10-CM   1. Unspecified mood (affective) disorder (HCC) F39   2. ADHD (attention deficit hyperactivity disorder), combined type F90.2     Past Psychiatric History: Past outpatient treatment for ADHD  Past Medical  History:  Past Medical History:  Diagnosis Date  . ADHD (attention deficit hyperactivity disorder)    no current med.  . Asthma    prn inhaler  . Depression   . Migraines   . 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 OCD (obsessive compulsive disorder)   . Oppositional defiant disorder   . PTSD (post-traumatic stress disorder)   . Stuffy nose 07/01/2017    Past Surgical History:  Procedure Laterality Date  . GANGLION CYST EXCISION Right 07/03/2017   Procedure: RIGHT HAND NEUROMA REMOVAL;  Surgeon: Leandrew Koyanagi, MD;  Location: [facility];  Service: Orthopedics;  Laterality: Right;  . neuroma of hand removed    . TENDON LENGTHENING Bilateral    as a child  . TOOTH EXTRACTION      Family Psychiatric History: See below  Family History:  Family History  Problem Relation Age of Onset  . Bipolar disorder Cousin   . Alcohol abuse Paternal Aunt   . Drug abuse Paternal Aunt   . Diabetes Mother   . Heart attack Father   . Cancer Father     Social History:  Social History   Socioeconomic History  . Marital status: Single    Spouse name: Not on file  . Number of children: Not on file  . Years of education: Not  on file  . Highest education level: Not on file  Occupational History  . Not on file  Social Needs  . Financial resource strain: Not on file  . Food insecurity:    Worry: Not on file    Inability: Not on file  . Transportation needs:    Medical: Not on file    Non-medical: Not on file  Tobacco Use  . Smoking status: Former Smoker    Years: 0.00    Last attempt to quit: 06/19/2013    Years since quitting: 4.6  . Smokeless tobacco: Former Systems developer    Types: [place] date: 02/12/2016  Substance and Sexual Activity  . Alcohol use: Yes    Frequency: Never    Comment: occasional  . Drug use: No  . Sexual activity: Yes    Partners: Female  Lifestyle  . Physical activity:    Days per week: Not on file    Minutes per session: Not on file  . Stress:  Not on file  Relationships  . Social connections:    Talks on phone: Not on file    Gets together: Not on file    Attends religious service: Not on file    Active member of club or organization: Not on file    Attends meetings of clubs or organizations: Not on file    Relationship status: Not on file  Other Topics Concern  . Not on file  Social History Narrative  . Not on file    Allergies:  Allergies  Allergen Reactions  . Strattera [Atomoxetine Hcl] Other (See Comments)    CAUSED AGGRESSION  . Sulfa Antibiotics Hives    Metabolic Disorder Labs: Lab Results  Component Value Date   HGBA1C  05/13/2008    5.2 (NOTE)   The ADA recommends the following therapeutic goal for glycemic   control related to Hgb A1C measurement:   Goal of Therapy:   < 7.0% Hgb A1C   Reference: American Diabetes Association: Clinical Practice   Recommendations 2008, Diabetes Care,  2008, 31:(Suppl 1).   MPG 103 05/13/2008   No results found for: PROLACTIN Lab Results  Component Value Date   CHOL 244 (H) 08/01/2017   TRIG 135 08/01/2017   HDL 42 08/01/2017   CHOLHDL 5.8 (H) 08/01/2017   VLDL 32 09/22/2013   LDLCALC 175 (H) 08/01/2017   LDLCALC 161 (H) 09/22/2013   Lab Results  Component Value Date   TSH 0.774 08/01/2017   TSH 0.862 10/01/2015    Therapeutic Level Labs: No results found for: LITHIUM Lab Results  Component Value Date   VALPROATE 79.6 05/12/2008   No components found for:  CBMZ  Current Medications: Current Outpatient Medications  Medication Sig Dispense Refill  . albuterol (PROVENTIL HFA;VENTOLIN HFA) 108 (90 Base) MCG/ACT inhaler Inhale every 6 (six) hours as needed into the lungs for wheezing or shortness of breath.    Marland Kitchen ibuprofen (ADVIL,MOTRIN) 600 MG tablet Take 1,200 mg every 6 (six) hours as needed by mouth for headache or moderate pain.     Marland Kitchen loratadine (CLARITIN) 10 MG tablet Take 10 mg daily as needed by mouth for allergies.     No current  facility-administered medications for this visit.      Musculoskeletal: Strength & Muscle Tone: within normal limits Gait & Station: normal Patient leans: N/A  Psychiatric Specialty Exam: Review of Systems  Psychiatric/Behavioral: The patient has insomnia.     Blood pressure 126/68, pulse 80, height 5'  6" (1.676 m), weight 205 lb (93 kg), SpO2 99 %.Body mass index is 33.09 kg/m.  General Appearance: Casual and Fairly Groomed  Eye Contact:  Good  Speech:  Clear and Coherent  Volume:  Normal  Mood:  Euthymic  Affect:  Congruent  Thought Process:  Goal Directed  Orientation:  Full (Time, Place, and Person)  Thought Content: WDL   Suicidal Thoughts:  No  Homicidal Thoughts:  No  Memory:  Immediate;   Good Recent;   Good Remote;   Fair  Judgement:  Fair  Insight:  Fair  Psychomotor Activity:  Normal  Concentration:  Concentration: Fair and Attention Span: Fair  Recall:  Good  Fund of Knowledge: Good  Language: Good  Akathisia:  No  Handed:  Right  AIMS (if indicated): not done  Assets:  Communication Skills Desire for Improvement Physical Health Resilience Social Support Talents/Skills  ADL's:  Intact  Cognition: WNL  Sleep:  Poor   Screenings: PHQ2-9     Office Visit from 10/31/2017 in [facility]  PHQ-2 Total Score  3  PHQ-9 Total Score  13       Assessment and Plan: This patient is a 27 year old male with a history of ADHD.  He is finding other ways to compensate for this and right now does not feel he needs medication.  He is also been evaluated for sleep apnea and is about to get CPAP which may help his overall focus.  He has been told he can return here only as needed and to let us know if he decides he needs medication again.   Levonne Spiller, MD 01/30/2018, 11:35 AM

## 2018-01-31 ENCOUNTER — Encounter: Payer: Self-pay | Admitting: Family Medicine

## 2018-01-31 ENCOUNTER — Ambulatory Visit: Payer: Medicaid Other | Admitting: Family Medicine

## 2018-01-31 VITALS — BP 130/88 | Ht 66.0 in | Wt 205.6 lb

## 2018-01-31 DIAGNOSIS — Z79899 Other long term (current) drug therapy: Secondary | ICD-10-CM

## 2018-01-31 DIAGNOSIS — E7849 Other hyperlipidemia: Secondary | ICD-10-CM | POA: Diagnosis not present

## 2018-01-31 DIAGNOSIS — I10 Essential (primary) hypertension: Secondary | ICD-10-CM | POA: Diagnosis not present

## 2018-01-31 DIAGNOSIS — G479 Sleep disorder, unspecified: Secondary | ICD-10-CM | POA: Diagnosis not present

## 2018-01-31 DIAGNOSIS — R7301 Impaired fasting glucose: Secondary | ICD-10-CM

## 2018-01-31 DIAGNOSIS — E785 Hyperlipidemia, unspecified: Secondary | ICD-10-CM | POA: Insufficient documentation

## 2018-01-31 NOTE — Progress Notes (Signed)
   Subjective:    Patient ID: [name], male    DOB: [birth year], 27 y.o.   MRN: [id number]  Hyperlipidemia  This is a chronic problem. There are no compliance problems.   Pt also here for sleep disorder. Pt is having trouble getting insurance to approve C-PAP machine. Pt states he does not sleep well.  [phone] [facility] Review of Systems    Currently patient relates fatigue tiredness snoring feeling rundown patient denies any chest tightness pressure pain shortness breath does state that he watches his diet tries-stay healthy takes his medication Objective:   Physical Exam Lungs are clear respiratory rate normal heart is regular no murmurs extremities no edema skin warm dry neurologic grossly normal       Assessment & Plan:  Hyperlipidemia-overall I feel this patient should get his labs rechecked may have to be on medication watch diet stay active try to lose weight  Sleep apnea being followed by specialist apparently the home health entity is having difficult time getting his sleep apnea machine hopefully they can get it soon because it is clinically indicated

## 2018-02-01 ENCOUNTER — Encounter: Payer: Self-pay | Admitting: Family Medicine

## 2018-02-01 LAB — LIPID PANEL
CHOLESTEROL TOTAL: 220 mg/dL — AB (ref 100–199)
Chol/HDL Ratio: 5.6 ratio — ABNORMAL HIGH (ref 0.0–5.0)
HDL: 39 mg/dL — ABNORMAL LOW (ref >39)
LDL Calculated: 144 mg/dL — ABNORMAL HIGH (ref 0–99)
Triglycerides: 183 mg/dL — ABNORMAL HIGH (ref 0–149)
VLDL Cholesterol Cal: 37 mg/dL (ref 5–40)

## 2018-02-01 LAB — HEPATIC FUNCTION PANEL
ALBUMIN: 4.6 g/dL (ref 3.5–5.5)
ALK PHOS: 66 IU/L (ref 39–117)
ALT: 28 IU/L (ref 0–44)
AST: 16 IU/L (ref 0–40)
BILIRUBIN TOTAL: 0.3 mg/dL (ref 0.0–1.2)
Bilirubin, Direct: 0.07 mg/dL (ref 0.00–0.40)
TOTAL PROTEIN: 7.8 g/dL (ref 6.0–8.5)

## 2018-02-01 LAB — BASIC METABOLIC PANEL
BUN/Creatinine Ratio: 15 (ref 9–20)
BUN: 11 mg/dL (ref 6–20)
CALCIUM: 9.7 mg/dL (ref 8.7–10.2)
CO2: 21 mmol/L (ref 20–29)
CREATININE: 0.74 mg/dL — AB (ref 0.76–1.27)
Chloride: 105 mmol/L (ref 96–106)
GFR calc Af Amer: 148 mL/min/{1.73_m2} (ref >59)
GFR calc non Af Amer: 128 mL/min/{1.73_m2} (ref >59)
Glucose: 91 mg/dL (ref 65–99)
Potassium: 5.1 mmol/L (ref 3.5–5.2)
Sodium: 144 mmol/L (ref 134–144)

## 2018-02-01 LAB — HEMOGLOBIN A1C
Est. average glucose Bld gHb Est-mCnc: 105 mg/dL
Hgb A1c MFr Bld: 5.3 % (ref 4.8–5.6)

## 2018-02-09 ENCOUNTER — Encounter: Payer: Self-pay | Admitting: Family Medicine

## 2018-02-09 NOTE — Progress Notes (Signed)
Please  FAX l this letter to [facility] [phone]

## 2018-02-10 DIAGNOSIS — G4733 Obstructive sleep apnea (adult) (pediatric): Secondary | ICD-10-CM | POA: Diagnosis not present

## 2018-02-13 ENCOUNTER — Ambulatory Visit (HOSPITAL_COMMUNITY): Payer: Medicaid Other | Admitting: Licensed Clinical Social Worker

## 2018-02-13 DIAGNOSIS — F39 Unspecified mood [affective] disorder: Secondary | ICD-10-CM | POA: Diagnosis not present

## 2018-02-14 ENCOUNTER — Encounter (HOSPITAL_COMMUNITY): Payer: Self-pay | Admitting: Licensed Clinical Social Worker

## 2018-02-14 NOTE — Progress Notes (Signed)
   THERAPIST PROGRESS NOTE  Session Time: 11-12  Participation Level: Active  Behavioral Response: Casual and NeatAlertEuthymic  Type of Therapy: Individual Therapy  Treatment Goals addressed: Tx Goal setting for management of mood and concentration  Interventions: CBT, Strength-based and Supportive  Summary: [name] is a 27 y.o. male who presents with hx of unstable mood, ADHD, and excessive worry. He reports he has not taken his ADHD medicine in 3-4 months and continues to feel stable and stay "mostly on task". Pt does not want to continue Vyvanse and has told his doctor this. Counselor and pt determine goals for counseling. Counselor is firm that pt must create at least 2 goals for therapy to continue. Pt states he wants to work on  "managing his money better and learning how to control his impulsive tendencies better". Counselor and pt establish some SMART goals around money management and increasing mindfulness for impulsivity.  Suicidal/Homicidal: Nowithout intent/plan  Therapist Response: Counselor used open questions, active listening, and goal setting. Counselor assessed pt level of functioning and medication compliance.  Plan: Return again in 4 weeks.  Diagnosis:    ICD-10-CM   1. Unspecified mood (affective) disorder [facility]) [place], LCAS-A 02/14/2018

## 2018-02-17 DIAGNOSIS — G4733 Obstructive sleep apnea (adult) (pediatric): Secondary | ICD-10-CM | POA: Diagnosis not present

## 2018-03-20 DIAGNOSIS — G4733 Obstructive sleep apnea (adult) (pediatric): Secondary | ICD-10-CM | POA: Diagnosis not present

## 2018-03-25 ENCOUNTER — Encounter: Payer: Self-pay | Admitting: Adult Health

## 2018-03-27 ENCOUNTER — Encounter: Payer: Self-pay | Admitting: Adult Health

## 2018-03-27 ENCOUNTER — Ambulatory Visit (INDEPENDENT_AMBULATORY_CARE_PROVIDER_SITE_OTHER): Payer: Medicaid Other | Admitting: Adult Health

## 2018-03-27 ENCOUNTER — Ambulatory Visit: Payer: Self-pay | Admitting: Adult Health

## 2018-03-27 ENCOUNTER — Telehealth: Payer: Self-pay

## 2018-03-27 VITALS — BP 134/79 | HR 78 | Ht 66.0 in | Wt 207.8 lb

## 2018-03-27 DIAGNOSIS — G4733 Obstructive sleep apnea (adult) (pediatric): Secondary | ICD-10-CM

## 2018-03-27 DIAGNOSIS — Z9989 Dependence on other enabling machines and devices: Secondary | ICD-10-CM | POA: Diagnosis not present

## 2018-03-27 NOTE — Progress Notes (Signed)
PATIENT: DORIS MCGILVERY DOB: 1991-05-02  REASON FOR VISIT: follow up HISTORY FROM: patient  HISTORY OF PRESENT ILLNESS: Today 03/27/18:  Mr. Laskaris is a 27 year old male with a history of obstructive sleep apnea on CPAP.  He returns today for follow-up.  His CPAP download shows that he uses machine 30 out of 30 days for compliance of 100%.  He uses machine every night greater than 4 hours.  On average he uses his machine 7 hours and 29 minutes.  His residual AHI is 1.6 on 6 to 10 cm of water with EPR of 2.  His leak in the 95th percentile is 15 L/min.  He reports that he does notice the benefit with using the CPAP machine.  He reports that there is a small hole in his tubing and needs his hose replaced.  He returns today for an evaluation.  HISTORY  FLAVIUS REPSHER is a 27 y.o. male , seen here in a referral  from Dr. Wolfgang Phoenix for hypersomnia.   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 He lives with his mother and his 1 year old daughter and 13 year old son,  she noted him falling asleep when he is not physically active or mentally stimulated. His wife is not living with them.  We are meeting today for a sleep consultation on 11 November 2017, Mr. 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 Today he works from 5 to 9 PM.  On a Saturday or Friday night however he may work as long as 1 AM. He commutes 15 minutes.   Chief complaint according to patient :" I am sleepy" he has been sleepy all his adult live, but got worse when he started late shift work.   Sleep habits are as follows: he goes to bed between midnight and 3 AM on week ends, and on week days between 12 and 1 AM.  He describes his bedroom as cool, quiet and dark.  He may watch TV before he goes to  bed, but he does not watch TV in bed.  Usually he is asleep very promptly, he usually prefers to sleep on his left side with one pillow for head support.  He describes himself as a hard deep sleeper and sleeps through the night no reported nocturia.  Girlfriend has noted him to snore but a sleep test 2 years ago was negative for apnea.  The test was performed at [facility] sleep lab. He has struggled with sleepiness. He dreams, but is not bothered by them. No nightmares. Some times he has palpitations and diaphoresis, dreaming he overslept.  Alarm is set for 6.45 and on weekends at 9 AM.  Feels refreshed every morning, but within an hour he could sleep again.  He takes naps and power nap which refreshes him more. Longer naps  Cause a temporal headache, bilaterally. He has a very dry mouth and postnasal drip.    Sleep medical history and family sleep history:  Father had Sleep apnea, OSA.   Social history: separated form the mother of his 2 children, but has sole custody. Lives with his mother , children and his girlfriend often visits. Non tobacco use, no marihuana use. Alcohol - likes  Shearon Stalls and drinks on some week ends.  Caffeine: soda - 1 a day, coffee - rarely, iced tea - 3-4 glasses a day with lunch or dinner.   REVIEW OF SYSTEMS: Out of a complete 14 system review of symptoms, the patient complains only of the following symptoms, and all other reviewed systems are negative.  See HPI, ESS 10,  ALLERGIES: Allergies  Allergen Reactions  . Strattera [Atomoxetine Hcl] Other (See Comments)    CAUSED AGGRESSION  . Sulfa Antibiotics Hives    HOME MEDICATIONS: Outpatient Medications Prior to Visit  Medication Sig Dispense Refill  . albuterol (PROVENTIL HFA;VENTOLIN HFA) 108 (90 Base) MCG/ACT inhaler Inhale every 6 (six) hours as needed into the lungs for wheezing or shortness of breath.    Marland Kitchen ibuprofen (ADVIL,MOTRIN) 600 MG tablet Take 1,200 mg every 6 (six) hours as needed by  mouth for headache or moderate pain.     Marland Kitchen loratadine (CLARITIN) 10 MG tablet Take 10 mg daily as needed by mouth for allergies.    . Phenylephrine-DM-GG-APAP (TYLENOL COLD/FLU SEVERE PO) Take by mouth. Takes prn (equate brand)    . Phenylephrine-APAP-guaiFENesin (TYLENOL COLD & HEAD PO) Take by mouth as needed. Using equate brand.    . Glucose-Vitamin C (EQUATE GLUCOSE PO) Take by mouth.     No facility-administered medications prior to visit.     PAST MEDICAL HISTORY: Past Medical History:  Diagnosis Date  . ADHD (attention deficit hyperactivity disorder)    no current med.  . Asthma    prn inhaler  . Depression   . Migraines   . 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 OCD (obsessive compulsive disorder)   . Oppositional defiant disorder   . PTSD (post-traumatic stress disorder)   . Stuffy nose 07/01/2017    PAST SURGICAL HISTORY: Past Surgical History:  Procedure Laterality Date  . GANGLION CYST EXCISION Right 07/03/2017   Procedure: RIGHT HAND NEUROMA REMOVAL;  Surgeon: Leandrew Koyanagi, MD;  Location: [place];  Service: Orthopedics;  Laterality: Right;  . neuroma of hand removed    . TENDON LENGTHENING Bilateral    as a child  . TOOTH EXTRACTION      FAMILY HISTORY: Family History  Problem Relation Age of Onset  . Bipolar disorder Cousin   . Alcohol abuse Paternal Aunt   . Drug abuse Paternal Aunt   . Diabetes Mother   . Heart attack Father   . Cancer Father     SOCIAL HISTORY: Social History   Socioeconomic History  . Marital status: Single    Spouse name: Not on file  . Number of children: Not on file  . Years of education: Not on file  . Highest education level: Not on file  Occupational History  . Not on file  Social Needs  . Financial resource strain: Not on file  . Food insecurity:    Worry: Not on file    Inability: Not on file  . Transportation needs:    Medical: Not on file    Non-medical: Not on file  Tobacco Use  . Smoking  status: Former Smoker    Years: 0.00    Last attempt to quit: 06/19/2013    Years since quitting: 4.7  . Smokeless tobacco: Former Systems developer    Types: [place] date: 02/12/2016  Substance and Sexual Activity  . Alcohol use: Yes    Frequency: Never    Comment: occasional  . Drug use:  No  . Sexual activity: Yes    Partners: Female  Lifestyle  . Physical activity:    Days per week: Not on file    Minutes per session: Not on file  . Stress: Not on file  Relationships  . Social connections:    Talks on phone: Not on file    Gets together: Not on file    Attends religious service: Not on file    Active member of club or organization: Not on file    Attends meetings of clubs or organizations: Not on file    Relationship status: Not on file  . Intimate partner violence:    Fear of current or ex partner: Not on file    Emotionally abused: Not on file    Physically abused: Not on file    Forced sexual activity: Not on file  Other Topics Concern  . Not on file  Social History Narrative  . Not on file      PHYSICAL EXAM  Vitals:   03/27/18 1054  BP: 134/79  Pulse: 78  Weight: 207 lb 12.8 oz (94.3 kg)  Height: 5\' 6"  (1.676 m)   Body mass index is 33.54 kg/m.  Generalized: Well developed, in no acute distress   Neurological examination  Mentation: Alert oriented to time, place, history taking. Follows all commands speech and language fluent Cranial nerve II-XII: Pupils were equal round reactive to light. Extraocular movements were full, visual field were full on confrontational test. Facial sensation and strength were normal. Uvula tongue midline. Head turning and shoulder shrug  were normal and symmetric.  Motor: The motor testing reveals 5 over 5 strength of all 4 extremities. Good symmetric motor tone is noted throughout.  Sensory: Sensory testing is intact to soft touch on all 4 extremities. No evidence of extinction is noted.  Coordination: Cerebellar testing reveals  good finger-nose-finger and heel-to-shin bilaterally.  Gait and station: Gait is normal.  Reflexes: Deep tendon reflexes are symmetric and normal bilaterally.   DIAGNOSTIC DATA (LABS, IMAGING, TESTING) - I reviewed patient records, labs, notes, testing and imaging myself where available.  Lab Results  Component Value Date   WBC 12.3 (H) 07/05/2017   HGB 14.8 07/05/2017   HCT 44.1 07/05/2017   MCV 85.5 07/05/2017   PLT 256 07/05/2017      Component Value Date/Time   NA 144 01/31/2018 1149   K 5.1 01/31/2018 1149   CL 105 01/31/2018 1149   CO2 21 01/31/2018 1149   GLUCOSE 91 01/31/2018 1149   GLUCOSE 124 (H) 07/05/2017 1417   BUN 11 01/31/2018 1149   CREATININE 0.74 (L) 01/31/2018 1149   CREATININE 0.66 09/22/2013 1050   CALCIUM 9.7 01/31/2018 1149   PROT 7.8 01/31/2018 1149   ALBUMIN 4.6 01/31/2018 1149   AST 16 01/31/2018 1149   ALT 28 01/31/2018 1149   ALKPHOS 66 01/31/2018 1149   BILITOT 0.3 01/31/2018 1149   GFRNONAA 128 01/31/2018 1149   GFRAA 148 01/31/2018 1149   Lab Results  Component Value Date   CHOL 220 (H) 01/31/2018   HDL 39 (L) 01/31/2018   LDLCALC 144 (H) 01/31/2018   TRIG 183 (H) 01/31/2018   CHOLHDL 5.6 (H) 01/31/2018   Lab Results  Component Value Date   HGBA1C 5.3 01/31/2018    Lab Results  Component Value Date   TSH 0.774 08/01/2017      ASSESSMENT AND PLAN 27 y.o. year old male  has a past medical history of ADHD (attention  deficit hyperactivity disorder), Asthma, Depression, Migraines, Neuroma of hand (06/2017), OCD (obsessive compulsive disorder), Oppositional defiant disorder, PTSD (post-traumatic stress disorder), and Stuffy nose (07/01/2017). here with:  1. OSA on CPAP  The patient CPAP download shows excellent compliance and good treatment of his apnea.  He is advised that if his symptoms worsen or he develops new symptoms he should let us know.  He will follow-up in 6 months or sooner if needed.   I spent 15 minutes with the  patient. 50% of this time was spent reviewing his CPAP download   Ward Givens, MSN, NP-C 03/27/2018, 11:30 AM [facility] [address] [place], [place] [address] [phone]

## 2018-03-27 NOTE — Telephone Encounter (Signed)
Order for CPAP supplies confirmed as received by [facility]. 03/27/2018.

## 2018-03-27 NOTE — Patient Instructions (Addendum)
Your Plan:  Continue using CPAP nightly If your symptoms worsen or you develop new symptoms please let us know.   Thank you for coming to see us at [facility]. I hope we have been able to provide you high quality care today.  You may receive a patient satisfaction survey over the next few weeks. We would appreciate your feedback and comments so that we may continue to improve ourselves and the health of our patients.  

## 2018-03-27 NOTE — Telephone Encounter (Signed)
DME order for a new hose for CPAP machine faxed to [facility]. MB RN.

## 2018-03-28 ENCOUNTER — Ambulatory Visit (INDEPENDENT_AMBULATORY_CARE_PROVIDER_SITE_OTHER): Payer: Medicaid Other | Admitting: Licensed Clinical Social Worker

## 2018-03-28 DIAGNOSIS — F39 Unspecified mood [affective] disorder: Secondary | ICD-10-CM | POA: Diagnosis not present

## 2018-04-02 ENCOUNTER — Encounter (HOSPITAL_COMMUNITY): Payer: Self-pay | Admitting: Licensed Clinical Social Worker

## 2018-04-02 NOTE — Progress Notes (Signed)
   THERAPIST PROGRESS NOTE  Session Time: 11-12  Participation Level: Active  Behavioral Response: Fairly GroomedAlertEuthymic  Type of Therapy: Individual Therapy  Treatment Goals addressed: Coping  Interventions: CBT and DBT  Summary: [name] is a 27 y.o. male who presents with disturbance of mood and depression. He remains broken up from his girlfriend, whom he has had numerous interpersonal difficulties w/ over the past 1.5 years. he states he has been thinking about long term goals and that he is trying to get more full-time employment. Pt has applied to [employer] and hopes to get an interview soon and begin working full time there. Pt expressed frustration w/ his current part time job at [employer] and the problems he is having w/ his manager "singling him out". Pt recently was sent home from work early after an argument w/ his leadership. Pt states he is "talking to an old girlfriend" and is interested in starting another relationship. Counselor spent time encouraging pt to consider learning to take responsibility for himself and his family rather than seek new romantic relationships.    Suicidal/Homicidal: Nowithout intent/plan  Therapist Response: Counselor used open and closed questions to help pt gain insight and set agenda for session. Counselor discussed codependency traits and dependent personality traits w/ pt, who appears fixated on maintaining romantic relationships to provide his life w/ meaning. Counselor assessed pt functionality and bx changes in regards to his parenting and relationship w/ his mother. Pt states he feels their home is "getting better" interpersonally and fighting has decreased.   Plan: Return again in 6 weeks.  Diagnosis:    ICD-10-CM   1. Unspecified mood (affective) disorder [facility]) [place], LCAS-A 04/02/2018

## 2018-04-20 DIAGNOSIS — G4733 Obstructive sleep apnea (adult) (pediatric): Secondary | ICD-10-CM | POA: Diagnosis not present

## 2018-04-23 NOTE — Progress Notes (Signed)
I agree with the assessment and plan as directed by NP .The patient is known to me .   Aybree Lanyon, MD  

## 2018-04-25 DIAGNOSIS — G4733 Obstructive sleep apnea (adult) (pediatric): Secondary | ICD-10-CM | POA: Diagnosis not present

## 2018-05-08 ENCOUNTER — Ambulatory Visit (HOSPITAL_COMMUNITY): Payer: Medicaid Other | Admitting: Licensed Clinical Social Worker

## 2018-05-08 DIAGNOSIS — F39 Unspecified mood [affective] disorder: Secondary | ICD-10-CM

## 2018-05-09 ENCOUNTER — Encounter (HOSPITAL_COMMUNITY): Payer: Self-pay | Admitting: Licensed Clinical Social Worker
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
## 2018-05-15 ENCOUNTER — Ambulatory Visit (HOSPITAL_COMMUNITY): Payer: Self-pay | Admitting: Licensed Clinical Social Worker

## 2018-05-20 ENCOUNTER — Telehealth: Payer: Self-pay | Admitting: Family Medicine

## 2018-05-20 ENCOUNTER — Encounter: Payer: Self-pay | Admitting: Family Medicine

## 2018-05-20 DIAGNOSIS — G4733 Obstructive sleep apnea (adult) (pediatric): Secondary | ICD-10-CM | POA: Diagnosis not present

## 2018-05-20 NOTE — Telephone Encounter (Signed)
Patient notified and verbalized understanding. 

## 2018-05-20 NOTE — Telephone Encounter (Signed)
Patient started with diarrhea this am -no vomiting or nausea. Patient requests a work note for today since he is not able to work. Work note up from for patient pick up.

## 2018-05-20 NOTE — Telephone Encounter (Signed)
Per nurse placing note for pt:  Patient has had diarrhea since this morning when he woke up, pt also had to pick son up today from school because of consistent vomiting. Advise.

## 2018-05-20 NOTE — Telephone Encounter (Signed)
I am perfectly fine with that advice follow-up if ongoing troubles May use Imodium as needed for diarrhea

## 2018-06-20 DIAGNOSIS — G4733 Obstructive sleep apnea (adult) (pediatric): Secondary | ICD-10-CM | POA: Diagnosis not present

## 2018-07-10 ENCOUNTER — Ambulatory Visit (HOSPITAL_COMMUNITY): Payer: Medicaid Other | Admitting: Licensed Clinical Social Worker

## 2018-07-10 DIAGNOSIS — F902 Attention-deficit hyperactivity disorder, combined type: Secondary | ICD-10-CM | POA: Diagnosis not present

## 2018-07-10 DIAGNOSIS — F39 Unspecified mood [affective] disorder: Secondary | ICD-10-CM

## 2018-07-11 ENCOUNTER — Encounter (HOSPITAL_COMMUNITY): Payer: Self-pay | Admitting: Licensed Clinical Social Worker

## 2018-07-11 NOTE — Progress Notes (Signed)
   THERAPIST PROGRESS NOTE  Session Time: 11-12  Participation Level: Active  Behavioral Response: NeatAlertEuthymic  Type of Therapy: Individual Therapy  Treatment Goals addressed: Coping  Interventions: CBT and Supportive  Summary: [name] is a 27 y.o. male who presents with ADHD and hx of mood disorder.   He is active, engaged, lucid, and upbeat in session. He smiles frequently. He states he is engaging w/ a new friend at least 2 times per week. He feels better about himself and his situation. He complains of his exgirlfriend but does not spend much session time on their relationship. Pt shows off videos of his kids and discusses his strategies as a single parent who co-parents w/ his own mother. Pt states he and his mother are getting along well. Pt discusses his marijuana use which he states happens 2 times per week and only when children are asleep. Pt has begun exercising and plans to attend [employer] in [place] 2 times per week w/ his friend. Pt is sleeping better and states he is not using his CPAP machine bc he does not need it.   Suicidal/Homicidal: Nowithout intent/plan  Therapist Response: Pt mood was markedly improved and counselor commented on this change and the link to sleeping better, exercise, and spending relaxing time w/ friends. Counselor encouraged pt and reflected back pt's mood and upbeat attitude.   Plan: Return again in 8 weeks.  Diagnosis:    ICD-10-CM   1. Unspecified mood (affective) disorder (HCC) F39   2. ADHD (attention deficit hyperactivity disorder), combined type F90.2       [name], LCAS-A 07/11/2018

## 2018-07-20 DIAGNOSIS — G4733 Obstructive sleep apnea (adult) (pediatric): Secondary | ICD-10-CM | POA: Diagnosis not present

## 2018-07-21 ENCOUNTER — Encounter (HOSPITAL_COMMUNITY): Payer: Self-pay | Admitting: Psychiatry

## 2018-07-21 ENCOUNTER — Ambulatory Visit (HOSPITAL_COMMUNITY): Payer: Medicaid Other | Admitting: Psychiatry

## 2018-07-21 VITALS — BP 148/92 | HR 86 | Ht 66.0 in | Wt 212.0 lb

## 2018-07-21 DIAGNOSIS — F902 Attention-deficit hyperactivity disorder, combined type: Secondary | ICD-10-CM

## 2018-07-21 MED ORDER — GUANFACINE HCL 1 MG PO TABS: 1.0000 mg | ORAL_TABLET | Freq: Two times a day (BID) | ORAL | 2 refills | Status: DC

## 2018-07-21 NOTE — Progress Notes (Signed)
BH MD/PA/NP OP Progress Note  07/21/2018 9:27 AM [name]  MRN:  [id number]  Chief Complaint:  Chief Complaint    ADHD; Follow-up     HPI: patient is a 27 year old divorcedwhite male who has been living with his Children in [place] He isworking delivering pizzas  His mother reports that he was always a very hyperactive child wouldn't listen and acted out. He was constantly in trouble in school. He started on Ritalin at an early age but it made him like a zombie and he lost a lot of weight. Over the years she's been on numerous stimulants. At age 71 his father took custody of him. He did not do well with his father and was very oppositional. He was eventually placed in a group home and went through 3 group homes to his teen years.  At age 51 he was arrested for continued contributing to do the deliquency of a minor. He was dating a 26 year old girl who ran away from home and was found with him he spent about 40 days in jail but he claims "turn my life around." Shortly after getting out of jail his father got ill with cancer and he spent a lot of time with him and was glad that they could reunite. The patient states he's currently doing well. He's under good control of his behavior. He's not particularly impulsive and is stable to stay focused.  The patient returns after 6 months.  Last time he decided to stay off Vyvanse because it was giving him headaches.  Adderall did the same thing.  However the longer he stays off it he realizes it is hard for him to stay on task and he is more agitated and fidgety and losing his temper more easily.  His 20-year-old son was recently placed on Intuniv for ADHD and he would like to give it a try.  He states Strattera made him very aggressive.  He is currently still working delivering pizzas but is planning to get a production job. Visit Diagnosis:    ICD-10-CM   1. ADHD (attention deficit hyperactivity disorder), combined type F90.2     Past  Psychiatric History: Past outpatient treatment for ADHD  Past Medical History:  Past Medical History:  Diagnosis Date  . ADHD (attention deficit hyperactivity disorder)    no current med.  . Asthma    prn inhaler  . Depression   . Migraines   . 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 OCD (obsessive compulsive disorder)   . Oppositional defiant disorder   . PTSD (post-traumatic stress disorder)   . Stuffy nose 07/01/2017    Past Surgical History:  Procedure Laterality Date  . GANGLION CYST EXCISION Right 07/03/2017   Procedure: RIGHT HAND NEUROMA REMOVAL;  Surgeon: Leandrew Koyanagi, MD;  Location: [place];  Service: Orthopedics;  Laterality: Right;  . neuroma of hand removed    . TENDON LENGTHENING Bilateral    as a child  . TOOTH EXTRACTION      Family Psychiatric History: See below  Family History:  Family History  Problem Relation Age of Onset  . Bipolar disorder Cousin   . Alcohol abuse Paternal Aunt   . Drug abuse Paternal Aunt   . Diabetes Mother   . Heart attack Father   . Cancer Father     Social History:  Social History   Socioeconomic History  . Marital status: Single    Spouse name: Not on file  .  Number of children: Not on file  . Years of education: Not on file  . Highest education level: Not on file  Occupational History  . Not on file  Social Needs  . Financial resource strain: Not on file  . Food insecurity:    Worry: Not on file    Inability: Not on file  . Transportation needs:    Medical: Not on file    Non-medical: Not on file  Tobacco Use  . Smoking status: Former Smoker    Years: 0.00    Last attempt to quit: 06/19/2013    Years since quitting: 5.0  . Smokeless tobacco: Former Systems developer    Types: [place] date: 02/12/2016  Substance and Sexual Activity  . Alcohol use: Yes    Frequency: Never    Comment: occasional  . Drug use: No  . Sexual activity: Yes    Partners: Female  Lifestyle  . Physical activity:     Days per week: Not on file    Minutes per session: Not on file  . Stress: Not on file  Relationships  . Social connections:    Talks on phone: Not on file    Gets together: Not on file    Attends religious service: Not on file    Active member of club or organization: Not on file    Attends meetings of clubs or organizations: Not on file    Relationship status: Not on file  Other Topics Concern  . Not on file  Social History Narrative  . Not on file    Allergies:  Allergies  Allergen Reactions  . Strattera [Atomoxetine Hcl] Other (See Comments)    CAUSED AGGRESSION  . Sulfa Antibiotics Hives    Metabolic Disorder Labs: Lab Results  Component Value Date   HGBA1C 5.3 01/31/2018   MPG 103 05/13/2008   No results found for: PROLACTIN Lab Results  Component Value Date   CHOL 220 (H) 01/31/2018   TRIG 183 (H) 01/31/2018   HDL 39 (L) 01/31/2018   CHOLHDL 5.6 (H) 01/31/2018   VLDL 32 09/22/2013   LDLCALC 144 (H) 01/31/2018   LDLCALC 175 (H) 08/01/2017   Lab Results  Component Value Date   TSH 0.774 08/01/2017   TSH 0.862 10/01/2015    Therapeutic Level Labs: No results found for: LITHIUM Lab Results  Component Value Date   VALPROATE 79.6 05/12/2008   No components found for:  CBMZ  Current Medications: Current Outpatient Medications  Medication Sig Dispense Refill  . albuterol (PROVENTIL HFA;VENTOLIN HFA) 108 (90 Base) MCG/ACT inhaler Inhale every 6 (six) hours as needed into the lungs for wheezing or shortness of breath.    Marland Kitchen ibuprofen (ADVIL,MOTRIN) 600 MG tablet Take 1,200 mg every 6 (six) hours as needed by mouth for headache or moderate pain.     Marland Kitchen loratadine (CLARITIN) 10 MG tablet Take 10 mg daily as needed by mouth for allergies.    . Phenylephrine-DM-GG-APAP (TYLENOL COLD/FLU SEVERE PO) Take by mouth. Takes prn (equate brand)    . guanFACINE (TENEX) 1 MG tablet Take 1 tablet (1 mg total) by mouth 2 (two) times daily. 60 tablet 2   No current  facility-administered medications for this visit.      Musculoskeletal: Strength & Muscle Tone: within normal limits Gait & Station: normal Patient leans: N/A  Psychiatric Specialty Exam: Review of Systems  All other systems reviewed and are negative.   Blood pressure (!) 148/92, pulse 86, height 5'  6" (1.676 m), weight 212 lb (96.2 kg), SpO2 99 %.Body mass index is 34.22 kg/m.  General Appearance: Casual and Fairly Groomed  Eye Contact:  Good  Speech:  Clear and Coherent  Volume:  Normal  Mood:  Euthymic  Affect:  Congruent  Thought Process:  Goal Directed  Orientation:  Full (Time, Place, and Person)  Thought Content: WDL   Suicidal Thoughts:  No  Homicidal Thoughts:  No  Memory:  Immediate;   Good Recent;   Good Remote;   Fair  Judgement:  Fair  Insight:  Fair  Psychomotor Activity:  Restlessness  Concentration:  Concentration: Poor and Attention Span: Poor  Recall:  Good  Fund of Knowledge: Fair  Language: Good  Akathisia:  No  Handed:  Right  AIMS (if indicated): not done  Assets:  Communication Skills Desire for Improvement Physical Health Resilience Social Support Talents/Skills  ADL's:  Intact  Cognition: WNL  Sleep:  Good   Screenings: PHQ2-9     Office Visit from 10/31/2017 in [facility]  PHQ-2 Total Score  3  PHQ-9 Total Score  13       Assessment and Plan: This patient is a 27 year old male with a history of ADHD.  He has had side effects from stimulants in the past.  His son is having a good response to guanfacine so he will start 1 mg twice daily.  He will return to see me in 6 weeks.   Levonne Spiller, MD 07/21/2018, 9:27 AM

## 2018-08-07 DIAGNOSIS — G4733 Obstructive sleep apnea (adult) (pediatric): Secondary | ICD-10-CM | POA: Diagnosis not present

## 2018-08-11 ENCOUNTER — Ambulatory Visit: Payer: Medicaid Other | Admitting: Family Medicine

## 2018-08-11 ENCOUNTER — Encounter: Payer: Self-pay | Admitting: Family Medicine

## 2018-08-11 VITALS — BP 138/90 | Temp 98.5°F | Ht 66.0 in | Wt 213.0 lb

## 2018-08-11 DIAGNOSIS — J329 Chronic sinusitis, unspecified: Secondary | ICD-10-CM | POA: Diagnosis not present

## 2018-08-11 DIAGNOSIS — M940 Chondrocostal junction syndrome [Tietze]: Secondary | ICD-10-CM

## 2018-08-11 DIAGNOSIS — J310 Chronic rhinitis: Secondary | ICD-10-CM

## 2018-08-11 MED ORDER — CEFPROZIL 500 MG PO TABS: 500.0000 mg | ORAL_TABLET | Freq: Two times a day (BID) | ORAL | 0 refills | Status: AC

## 2018-08-11 NOTE — Progress Notes (Signed)
   Subjective:    Patient ID: [name], male    DOB: [birth year], 27 y.o.   MRN: [id number]  HPI Patient is here today with complaints of a cough and a headache,nauseous,    abdominal pain/pulling on his left side of chest.  Pt staying active at work, some feelings of nausea  Cough notes c p left ant chrst sharp in nature  Cough min productive   Does not smoke    Some nasal gunky discharge of the nasa passages     On going for the last four days, he has been taking Ibuprofen.   Review of Systems No headache, no major weight loss or weight gain, no chest pain no back pain abdominal pain no change in bowel habits complete ROS otherwise negative     Objective:   Physical Exam  Alert, mild malaise. Hydration good Vitals stable. frontal/ maxillary tenderness evident positive nasal congestion. pharynx normal neck supple  lungs clear/no crackles or wheezes. heart regular in rhythm       Assessment & Plan:  Impression rhinosinusitis likely post viral, discussed with patient. plan antibiotics prescribed. Questions answered. Symptomatic care discussed. warning signs discussed. WSL Chest pain sharp in nature.  Worse with cough.  Likely musculoskeletal discussed

## 2018-08-11 NOTE — Progress Notes (Signed)
   Subjective:    Patient ID: [name], male    DOB: February 28, 1991, 27 y.o.   MRN: [id number]  HPI    Review of Systems     Objective:   Physical Exam        Assessment & Plan:

## 2018-08-20 DIAGNOSIS — G4733 Obstructive sleep apnea (adult) (pediatric): Secondary | ICD-10-CM | POA: Diagnosis not present

## 2018-09-01 ENCOUNTER — Encounter: Payer: Self-pay | Admitting: Family Medicine

## 2018-09-01 ENCOUNTER — Ambulatory Visit (HOSPITAL_COMMUNITY): Payer: Medicaid Other | Admitting: Psychiatry

## 2018-09-01 ENCOUNTER — Encounter (HOSPITAL_COMMUNITY): Payer: Self-pay | Admitting: Psychiatry

## 2018-09-01 ENCOUNTER — Ambulatory Visit: Payer: Medicaid Other | Admitting: Family Medicine

## 2018-09-01 VITALS — BP 115/78 | HR 80 | Ht 66.0 in | Wt 213.0 lb

## 2018-09-01 VITALS — Temp 98.2°F | Ht 66.0 in | Wt 213.6 lb

## 2018-09-01 DIAGNOSIS — J111 Influenza due to unidentified influenza virus with other respiratory manifestations: Secondary | ICD-10-CM | POA: Diagnosis not present

## 2018-09-01 DIAGNOSIS — F902 Attention-deficit hyperactivity disorder, combined type: Secondary | ICD-10-CM

## 2018-09-01 MED ORDER — GUANFACINE HCL 1 MG PO TABS: 1.0000 mg | ORAL_TABLET | Freq: Every day | ORAL | 2 refills | Status: DC

## 2018-09-01 MED ORDER — OSELTAMIVIR PHOSPHATE 75 MG PO CAPS: 75.0000 mg | ORAL_CAPSULE | Freq: Two times a day (BID) | ORAL | 0 refills | Status: DC

## 2018-09-01 NOTE — Progress Notes (Signed)
[facility] MD/PA/NP OP Progress Note  09/01/2018 9:28 AM [name]  MRN:  [id number]  Chief Complaint:  Chief Complaint    ADD; Follow-up     HPI: patient is a 28 year old divorcedwhite male who has been living with his Children in [place] He isworking delivering pizzas  His mother reports that he was always a very hyperactive child wouldn't listen and acted out. He was constantly in trouble in school. He started on Ritalin at an early age but it made him like a zombie and he lost a lot of weight. Over the years she's been on numerous stimulants. At age 7 his father took custody of him. He did not do well with his father and was very oppositional. He was eventually placed in a group home and went through 3 group homes to his teen years.  At age 82 he was arrested for continued contributing to do the deliquency of a minor. He was dating a 27 year old girl who ran away from home and was found with him he spent about 40 days in jail but he claims "turn my life around." Shortly after getting out of jail his father got ill with cancer and he spent a lot of time with him and was glad that they could reunite. The patient states he's currently doing well. He's under good control of his behavior. He's not particularly impulsive and is stable to stay focused.  The patient returns after 1 month.  He is now on Intuniv.  He is only taking 1 mg daily and he claims it is helping his focus particularly at work.  He denies any side effects.  He states he did not really need to take it twice a day.  He is still working for [employer] but is looking for a Psychologist, educational job.  He denies any symptoms of depression or anxiety and he is not abusing substances. Visit Diagnosis:    ICD-10-CM   1. ADHD (attention deficit hyperactivity disorder), combined type F90.2     Past Psychiatric History: Past outpatient treatment for ADHD  Past Medical History:  Past Medical History:  Diagnosis Date  . ADHD  (attention deficit hyperactivity disorder)    no current med.  . Asthma    prn inhaler  . Depression   . Migraines   . 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 OCD (obsessive compulsive disorder)   . Oppositional defiant disorder   . PTSD (post-traumatic stress disorder)   . Stuffy nose 07/01/2017    Past Surgical History:  Procedure Laterality Date  . GANGLION CYST EXCISION Right 07/03/2017   Procedure: RIGHT HAND NEUROMA REMOVAL;  Surgeon: Leandrew Koyanagi, MD;  Location: [facility];  Service: Orthopedics;  Laterality: Right;  . neuroma of hand removed    . TENDON LENGTHENING Bilateral    as a child  . TOOTH EXTRACTION      Family Psychiatric History: See below  Family History:  Family History  Problem Relation Age of Onset  . Bipolar disorder Cousin   . Alcohol abuse Paternal Aunt   . Drug abuse Paternal Aunt   . Diabetes Mother   . Heart attack Father   . Cancer Father     Social History:  Social History   Socioeconomic History  . Marital status: Single    Spouse name: Not on file  . Number of children: Not on file  . Years of education: Not on file  . Highest education level: Not  on file  Occupational History  . Not on file  Social Needs  . Financial resource strain: Not on file  . Food insecurity:    Worry: Not on file    Inability: Not on file  . Transportation needs:    Medical: Not on file    Non-medical: Not on file  Tobacco Use  . Smoking status: Former Smoker    Years: 0.00    Last attempt to quit: 06/19/2013    Years since quitting: 5.2  . Smokeless tobacco: Former Systems developer    Types: [place] date: 02/12/2016  Substance and Sexual Activity  . Alcohol use: Yes    Frequency: Never    Comment: occasional  . Drug use: No  . Sexual activity: Yes    Partners: Female  Lifestyle  . Physical activity:    Days per week: Not on file    Minutes per session: Not on file  . Stress: Not on file  Relationships  . Social connections:     Talks on phone: Not on file    Gets together: Not on file    Attends religious service: Not on file    Active member of club or organization: Not on file    Attends meetings of clubs or organizations: Not on file    Relationship status: Not on file  Other Topics Concern  . Not on file  Social History Narrative  . Not on file    Allergies:  Allergies  Allergen Reactions  . Strattera [Atomoxetine Hcl] Other (See Comments)    CAUSED AGGRESSION  . Sulfa Antibiotics Hives    Metabolic Disorder Labs: Lab Results  Component Value Date   HGBA1C 5.3 01/31/2018   MPG 103 05/13/2008   No results found for: PROLACTIN Lab Results  Component Value Date   CHOL 220 (H) 01/31/2018   TRIG 183 (H) 01/31/2018   HDL 39 (L) 01/31/2018   CHOLHDL 5.6 (H) 01/31/2018   VLDL 32 09/22/2013   LDLCALC 144 (H) 01/31/2018   LDLCALC 175 (H) 08/01/2017   Lab Results  Component Value Date   TSH 0.774 08/01/2017   TSH 0.862 10/01/2015    Therapeutic Level Labs: No results found for: LITHIUM Lab Results  Component Value Date   VALPROATE 79.6 05/12/2008   No components found for:  CBMZ  Current Medications: Current Outpatient Medications  Medication Sig Dispense Refill  . albuterol (PROVENTIL HFA;VENTOLIN HFA) 108 (90 Base) MCG/ACT inhaler Inhale every 6 (six) hours as needed into the lungs for wheezing or shortness of breath.    . guanFACINE (TENEX) 1 MG tablet Take 1 tablet (1 mg total) by mouth at bedtime. 30 tablet 2  . ibuprofen (ADVIL,MOTRIN) 600 MG tablet Take 1,200 mg every 6 (six) hours as needed by mouth for headache or moderate pain.     Marland Kitchen loratadine (CLARITIN) 10 MG tablet Take 10 mg daily as needed by mouth for allergies.    . Phenylephrine-DM-GG-APAP (TYLENOL COLD/FLU SEVERE PO) Take by mouth. Takes prn (equate brand)     No current facility-administered medications for this visit.      Musculoskeletal: Strength & Muscle Tone: within normal limits Gait & Station:  normal Patient leans: N/A  Psychiatric Specialty Exam: Review of Systems  All other systems reviewed and are negative.   Blood pressure 115/78, pulse 80, height 5\' 6"  (1.676 m), weight 213 lb (96.6 kg), SpO2 98 %.Body mass index is 34.38 kg/m.  General Appearance: Casual and Fairly Groomed  Eye Contact:  Good  Speech:  Clear and Coherent  Volume:  Normal  Mood:  Euthymic  Affect:  Congruent  Thought Process:  Goal Directed  Orientation:  Full (Time, Place, and Person)  Thought Content: WDL   Suicidal Thoughts:  No  Homicidal Thoughts:  No  Memory:  Immediate;   Good Recent;   Good Remote;   Fair  Judgement:  Fair  Insight:  Shallow  Psychomotor Activity:  Normal  Concentration:  Concentration: Good and Attention Span: Good  Recall:  Good  Fund of Knowledge: Fair  Language: Good  Akathisia:  No  Handed:  Right  AIMS (if indicated): not done  Assets:  Communication Skills Desire for Improvement Physical Health Resilience Social Support Talents/Skills  ADL's:  Intact  Cognition: WNL  Sleep:  Good   Screenings: PHQ2-9     Office Visit from 10/31/2017 in [facility]  PHQ-2 Total Score  3  PHQ-9 Total Score  13       Assessment and Plan: Patient is a 28 year old male with a history of ADHD.  He seems to be doing okay on Intuniv 1 mg daily.  He will will continue on this dosage and return to see me in 3 months   Levonne Spiller, MD 09/01/2018, 9:28 AM

## 2018-09-01 NOTE — Progress Notes (Signed)
   Subjective:    Patient ID: [name], male    DOB: 20-Sep-1990, 28 y.o.   MRN: [id number]  Cough  This is a new problem. The current episode started today. Associated symptoms include chills, headaches and rhinorrhea. Pertinent negatives include no chest pain, fever or shortness of breath. Associated symptoms comments: Nausea and vomiting. He has tried nothing for the symptoms.   Body aches sweats chills low-grade fever some congestion coughing all onset over the past 24 hours PMH benign   Review of Systems  Constitutional: Positive for chills and fatigue. Negative for fever.  HENT: Positive for congestion and rhinorrhea.   Respiratory: Positive for cough. Negative for shortness of breath.   Cardiovascular: Negative for chest pain.  Gastrointestinal: Positive for nausea. Negative for vomiting.  Neurological: Positive for headaches. Negative for numbness.       Objective:   Physical Exam Vitals signs and nursing note reviewed.  Constitutional:      Appearance: He is well-developed.  HENT:     Head: Normocephalic.     Mouth/Throat:     Pharynx: No oropharyngeal exudate.  Neck:     Musculoskeletal: Normal range of motion.  Cardiovascular:     Rate and Rhythm: Normal rate and regular rhythm.     Heart sounds: Normal heart sounds. No murmur.  Pulmonary:     Effort: Pulmonary effort is normal.     Breath sounds: Normal breath sounds. No wheezing.  Lymphadenopathy:     Cervical: No cervical adenopathy.  Skin:    General: Skin is warm and dry.  Neurological:     Motor: No abnormal muscle tone.           Assessment & Plan:  Influenza-the patient was diagnosed with influenza. Patient/family educated about the flu and warning signs to watch for. If difficulty breathing,  cyanosis, disorientation, or progressive worsening then immediately get rechecked at the ER. If progressive symptoms be certain to be rechecked. Supportive measures such as Tylenol/ibuprofen was  discussed. No aspirin use in children.

## 2018-09-01 NOTE — Patient Instructions (Signed)

## 2018-09-04 ENCOUNTER — Telehealth: Payer: Self-pay | Admitting: Family Medicine

## 2018-09-04 NOTE — Telephone Encounter (Signed)
Pt in office today for children's flu shots. Pt diagnosed with flu on Monday. Pt states he is no better. Pt states he vomiting and still having cough. Pt states he has gotten worse. Pt would like work excuse for Friday Jan 17 and Saturday Jan 18. Please advise. Thank you.

## 2018-09-04 NOTE — Telephone Encounter (Signed)
error 

## 2018-09-05 ENCOUNTER — Encounter: Payer: Self-pay | Admitting: Family Medicine

## 2018-09-05 NOTE — Telephone Encounter (Signed)
Patient is aware to pick up from the desk. Message sent to the admin pool

## 2018-09-05 NOTE — Telephone Encounter (Signed)
May have work excuse if he gets worse should be recommended for the patient to be seen

## 2018-09-11 ENCOUNTER — Encounter (HOSPITAL_COMMUNITY): Payer: Self-pay | Admitting: Licensed Clinical Social Worker

## 2018-09-11 ENCOUNTER — Ambulatory Visit (HOSPITAL_COMMUNITY): Payer: Medicaid Other | Admitting: Licensed Clinical Social Worker

## 2018-09-11 DIAGNOSIS — F902 Attention-deficit hyperactivity disorder, combined type: Secondary | ICD-10-CM | POA: Diagnosis not present

## 2018-09-11 DIAGNOSIS — F39 Unspecified mood [affective] disorder: Secondary | ICD-10-CM

## 2018-09-11 NOTE — Progress Notes (Signed)
   THERAPIST PROGRESS NOTE  Session Time: 11:30-12:30PM  Participation Level: Active  Behavioral Response: Well GroomedAlertEuthymic  Type of Therapy: Individual Therapy  Treatment Goals addressed: Coping  Interventions: CBT  Summary: [name] is a 28 y.o. male who presents with hx of mood disorder and ADHD.  He is active, engaged, and talkative in session. He smiles often and jokes lightly. He reports he continues to go to gym for cardio and strength training at least 2 times per week. HE continues to gain hope and emotional strength from friendships. He is limiting his attention he is putting on his ex Girlfriend and seeing benefits from this. He talks about how he had to put his dog down unexpectedly due to sickness. He becomes tearful and discusses his grief.  Suicidal/Homicidal: Nowithout intent/plan  Therapist Response: Counselor used open questions, active listening, and supportive encouragement. PT appears to be doing well, benefiting from weekly exercise, and states his family is doing well. PT is tolerating going down in counseling to 6 times per year vs 1 time per month.   Plan: Return again in 8 weeks.  Diagnosis:    ICD-10-CM   1. ADHD (attention deficit hyperactivity disorder), combined type F90.2   2. Unspecified mood (affective) disorder [facility]) [place], LCAS-A 09/11/2018

## 2018-09-15 ENCOUNTER — Telehealth: Payer: Self-pay | Admitting: Neurology

## 2018-09-15 ENCOUNTER — Other Ambulatory Visit: Payer: Self-pay | Admitting: Neurology

## 2018-09-15 DIAGNOSIS — Z9989 Dependence on other enabling machines and devices: Secondary | ICD-10-CM

## 2018-09-15 DIAGNOSIS — R0683 Snoring: Secondary | ICD-10-CM

## 2018-09-15 DIAGNOSIS — G4721 Circadian rhythm sleep disorder, delayed sleep phase type: Secondary | ICD-10-CM

## 2018-09-15 DIAGNOSIS — G4733 Obstructive sleep apnea (adult) (pediatric): Secondary | ICD-10-CM

## 2018-09-15 DIAGNOSIS — G4719 Other hypersomnia: Secondary | ICD-10-CM

## 2018-09-15 NOTE — Telephone Encounter (Signed)
Lets review his meds before MSLT- phenylephrine is not allowed !

## 2018-09-15 NOTE — Telephone Encounter (Signed)
Called the patient and advised that Dr Brett Fairy would like to move forward with the MSLT testing for narcolepsy. I have reviewed the medications with him and told him I sleep study is booking out to the 2-3 wk mark. Advised the patient to avoid over the counter medications that contain the phenlyephrine products are decongestants. Advised the patient to talk to the ordering provider on how he should stop the guanfacine as he would need to make sure he is off of that at least 14 days prior. Advised that a urine drug screen is completed because we look to see if their are any medications in the system that can alter test. The patient verbalized understanding. Pt will wait from a call from the sleep lab to get him scheduled once we get insurance approval

## 2018-09-15 NOTE — Telephone Encounter (Signed)
Patient called and requested to speak with Dr. Allie Bossier nurse regarding him possibly having narcolepsy. He states that Dr. Brett Fairy has stated previously he may have this and he feels that he needs to discuss with the nurse. Please call and advise.

## 2018-09-15 NOTE — Telephone Encounter (Signed)
Patient is compliant using the machine. Pt states that he feels well rested in the morning after using the machine but he has found that afternoon he becomes tired and to the point where he finds himself nodding off when he is driving. Advised the patient that I will make Dr Dohmeier aware of this and see if she will like to proceed forward with testing for narcolepsy. Went over the patient's medication list. He has been taken guanfacine 1 mg at bedtime for about a month for adhd. Informed the patient he may have to come off of those medications prior to testing. I will call patient back with what Dr Brett Fairy recommends.

## 2018-09-15 NOTE — Addendum Note (Signed)
Addended by: Darleen Crocker on: 09/15/2018 04:24 PM   Modules accepted: Orders

## 2018-09-20 DIAGNOSIS — G4733 Obstructive sleep apnea (adult) (pediatric): Secondary | ICD-10-CM | POA: Diagnosis not present

## 2018-10-08 ENCOUNTER — Ambulatory Visit (INDEPENDENT_AMBULATORY_CARE_PROVIDER_SITE_OTHER): Payer: Medicaid Other | Admitting: Neurology

## 2018-10-08 DIAGNOSIS — G4733 Obstructive sleep apnea (adult) (pediatric): Secondary | ICD-10-CM | POA: Diagnosis not present

## 2018-10-08 DIAGNOSIS — G4721 Circadian rhythm sleep disorder, delayed sleep phase type: Secondary | ICD-10-CM

## 2018-10-08 DIAGNOSIS — Z9989 Dependence on other enabling machines and devices: Secondary | ICD-10-CM

## 2018-10-08 DIAGNOSIS — R0683 Snoring: Secondary | ICD-10-CM

## 2018-10-08 DIAGNOSIS — G4719 Other hypersomnia: Secondary | ICD-10-CM

## 2018-10-15 DIAGNOSIS — G4733 Obstructive sleep apnea (adult) (pediatric): Secondary | ICD-10-CM | POA: Insufficient documentation

## 2018-10-15 DIAGNOSIS — Z9989 Dependence on other enabling machines and devices: Secondary | ICD-10-CM

## 2018-10-15 NOTE — Progress Notes (Signed)
DIAGNOSIS 1. Primary Snoring and now mostly Obstructive Sleep Apnea,  responding well to CPAP pressures from 8 through 12 cm water,  providing high sleep efficiency and long REM latency.    PLANS/RECOMMENDATIONS: This Study was valid for an MSLT to  follow, but the laboratory had to close due to inclement  weather. The over 120 minute REM sleep latency was not suspicious for  narcolepsy. I can offer a repeat MSLT, but the first nap in our  daytime study went by without any sleep being initiated. The  diagnostic outcome of a test result positive for narcolepsy is  thereby lower.

## 2018-10-15 NOTE — Procedures (Signed)
PATIENT'S NAME:  [name], [name] DOB:      [birth year]      MR#:    [id number]     DATE OF RECORDING: 10/08/2018 REFERRING M.D.:  Ward Givens, NP Study Performed:   PSG for MSLT to follow, patient using CPAP. Possible Re-titration. HISTORY: Mr. Crotty is a 28 year old father of 2 with a history of excessive daytime sleepiness. He was diagnosed by a PSG on 12-26-2017 with Sleep apnea at an AHI of 16.3/h, supine AH of 69/h, and returned on 01-05-2018 to be titrated on CPAP.  The best tolerated pressure was at 8 cm water, some central apneas emerged at higher pressures. He uses now compliantly an autotitration device with a setting from 5-10 cm water with 2 cm EPR. His CPAP download shows that he uses machine 30 out of 30 days for compliance of 100%.  His residual AHI is 1.6/h on 6 to 10 cm of water with EPR of 2.  His 95% pressure need was not documented. He reports that he does notice the benefit with using the CPAP machine, but remained still excessively daytime sleepy. 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 History:   ADHD, Asthma, Depression, Migraines, Depression, OCD, Oppositional defiant disorder, PTSD, Stuffy nose  The patient endorsed the Epworth Sleepiness Scale at 19/24 points.   The patient's weight 207 pounds with a height of 66 (inches), resulting in a BMI of 33.3 kg/m2. The patient's neck circumference measured 16 inches.  CURRENT MEDICATIONS: Proventil, Advil, Claritin    PROCEDURE:  This is a multichannel digital polysomnogram utilizing the SomnoStar 11.2 system.  Electrodes and sensors were applied and monitored per AASM Specifications.   EEG, EOG, Chin and Limb EMG, were sampled at 200 Hz.  ECG, Snore and Nasal Pressure, Thermal Airflow, Respiratory Effort, CPAP Flow and Pressure, Oximetry was sampled at 50 Hz. Digital video and audio were recorded.       CPAP  was initiated at 6 cmH20 with heated humidity per AASM split night standards and pressure was advanced to 12 cmH20 because of hypopneas, apneas and desaturations.   CPAP pressures at 9 cm allowed for an AHI of 1.3/h. 11 cm water was associated with AHI of 2.5/h. At a PAP pressure of 12 cmH20, there was a reduction of the AHI to 0.0 /h with improvement of sleep apnea. A non -full face mask was used.  Lights Out was at 22:22 and Lights On at 04:55. Total recording time (TRT) was 393.5 minutes, with a total sleep time (TST) of 379.5 minutes. The patient's sleep latency was 4.5 minutes.  REM latency was 126 minutes.  The sleep efficiency was 96.4 %.    SLEEP ARCHITECTURE: WASO (Wake after sleep onset) was 11 minutes.  There were 17 minutes in Stage N1, 187 minutes Stage N2, 112.5 minutes Stage N3 and 63 minutes in Stage REM.  The percentage of Stage N1 was 4.5%, Stage N2 was 49.3%, Stage N3 was 29.6% and Stage R (REM sleep) was 16.6%.   RESPIRATORY ANALYSIS:  There was a total of 15 respiratory events: 15 hypopneas. The total APNEA/HYPOPNEA INDEX (AHI) was 2.4 /hour. 0 events occurred in REM sleep and 15 events in NREM. The REM AHI was 0 /hour versus a non-REM AHI of 2.8 /hour.  The patient spent 238 minutes of total sleep time in the supine position and 142 minutes in  non-supine. The supine AHI was 3.5, versus a non-supine AHI of 0.4.  OXYGEN SATURATION & C02:  The baseline 02 saturation was 94%, with the lowest being 90%. Time spent below 89% saturation equaled 0 minutes.  AROUSALS: The arousals were noted as: 38 were spontaneous, 0 were associated with PLMs, and 8 were associated with respiratory events.  The patient had a total of 0 Periodic Limb Movements. The Periodic Limb Movement (PLM) index was 0 and the PLM Arousal index was 0 /hour. Audio and video analysis did not show any abnormal or unusual movements, behaviors, phonations or vocalizations. Some Snoring was noted. EKG was in keeping with  normal sinus rhythm (NSR).  Post-study, the patient indicated that sleep was the same as usual.   DIAGNOSIS 1. Primary Snoring and now mostly Obstructive Sleep Apnea, responding well to CPAP pressures from 8 through 12 cm water, providing high sleep efficiency and long REM latency.     PLANS/RECOMMENDATIONS: This Study was valid for an MSLT to follow, but the laboratory had to  close due to inclement weather.  The over 120 minute REM sleep latency was not suspicious for narcolepsy. I can offer a repeat MSLT, but the first nap in our daytime study went by without any sleep being initiated. The diagnostic outcome of a test result positive for narcolepsy is thereby lower.    A follow up appointment will be scheduled in the Sleep Clinic at [facility].   Please call [phone] with any questions.      I certify that I have reviewed the entire raw data recording prior to the issuance of this report in accordance with the Standards of Accreditation of the American Academy of Sleep Medicine (AASM)  Larey Seat, M.D.  10-15-2018 Diplomat, American Board of Psychiatry and Neurology  Diplomat, [place] of Sleep Medicine Medical Director, [place] [facility]

## 2018-10-16 ENCOUNTER — Ambulatory Visit: Payer: Medicaid Other | Admitting: Family Medicine

## 2018-10-16 ENCOUNTER — Encounter: Payer: Self-pay | Admitting: Family Medicine

## 2018-10-16 ENCOUNTER — Other Ambulatory Visit: Payer: Self-pay | Admitting: Neurology

## 2018-10-16 VITALS — Temp 98.8°F | Wt 214.0 lb

## 2018-10-16 DIAGNOSIS — J111 Influenza due to unidentified influenza virus with other respiratory manifestations: Secondary | ICD-10-CM | POA: Diagnosis not present

## 2018-10-16 DIAGNOSIS — Z9989 Dependence on other enabling machines and devices: Principal | ICD-10-CM

## 2018-10-16 DIAGNOSIS — J029 Acute pharyngitis, unspecified: Secondary | ICD-10-CM | POA: Diagnosis not present

## 2018-10-16 DIAGNOSIS — G4733 Obstructive sleep apnea (adult) (pediatric): Secondary | ICD-10-CM

## 2018-10-16 LAB — POCT RAPID STREP A (OFFICE): RAPID STREP A SCREEN: NEGATIVE

## 2018-10-16 NOTE — Patient Instructions (Signed)

## 2018-10-16 NOTE — Progress Notes (Signed)
   Subjective:    Patient ID: [name], male    DOB: 18-Feb-1991, 28 y.o.   MRN: [id number]  Sore Throat   This is a new problem. The current episode started in the past 7 days. Associated symptoms include congestion, coughing and vomiting (post tussive). Pertinent negatives include no abdominal pain, diarrhea, ear pain or shortness of breath.   4 days ago started with sore throat and sinus drainage. Cough productive - clear/yellow mucous. States post-tussive vomiting of mucous. No fever. No nausea or diarrhea. No chills or body aches. Has not needed to use inhaler.   Trying chloraseptic spray.   Review of Systems  Constitutional: Negative for chills and fever.  HENT: Positive for congestion, postnasal drip and sore throat. Negative for ear pain.   Eyes: Negative for discharge.  Respiratory: Positive for cough. Negative for shortness of breath and wheezing.   Gastrointestinal: Positive for vomiting (post tussive). Negative for abdominal pain, diarrhea and nausea.  Musculoskeletal: Negative for myalgias.       Objective:   Physical Exam Vitals signs and nursing note reviewed.  Constitutional:      General: He is not in acute distress.    Appearance: He is not toxic-appearing.  HENT:     Head: Normocephalic and atraumatic.     Right Ear: Tympanic membrane normal.     Left Ear: Tympanic membrane normal.     Nose: Nose normal.     Right Sinus: No maxillary sinus tenderness or frontal sinus tenderness.     Left Sinus: No maxillary sinus tenderness or frontal sinus tenderness.     Mouth/Throat:     Mouth: Mucous membranes are moist.     Pharynx: Posterior oropharyngeal erythema present.  Eyes:     General:        Right eye: No discharge.        Left eye: No discharge.  Neck:     Musculoskeletal: Neck supple. No neck rigidity.  Cardiovascular:     Rate and Rhythm: Normal rate and regular rhythm.     Heart sounds: Normal heart sounds.  Pulmonary:     Effort: Pulmonary  effort is normal. No respiratory distress.     Breath sounds: Normal breath sounds. No wheezing or rales.  Lymphadenopathy:     Cervical: No cervical adenopathy.  Skin:    General: Skin is warm and dry.  Neurological:     Mental Status: He is alert and oriented to person, place, and time.  Psychiatric:        Behavior: Behavior normal.    Results for orders placed or performed in visit on 10/16/18  POCT rapid strep A  Result Value Ref Range   Rapid Strep A Screen Negative Negative           Assessment & Plan:  Influenza  Sore throat - Plan: POCT rapid strep A, Grp A Strep  Discussed likely viral etiology at this point, likely flu based on symptoms. He is past the window for treatment with tamiflu. Symptomatic care discussed. Warning signs discussed. F/u if symptoms worsen or fail to improve.   Dr. Mickie Hillier was consulted on this case and is in agreement with the above treatment plan.

## 2018-10-17 LAB — STREP A DNA PROBE: Strep Gp A Direct, DNA Probe: NEGATIVE

## 2018-10-19 DIAGNOSIS — G4733 Obstructive sleep apnea (adult) (pediatric): Secondary | ICD-10-CM | POA: Diagnosis not present

## 2018-10-20 ENCOUNTER — Telehealth: Payer: Self-pay | Admitting: Neurology

## 2018-10-20 NOTE — Telephone Encounter (Signed)
Called the patient and reviewed his sleep test with him. Advised the patient that based off the sleep study it appears the patient had not been fully getting treated with correct CPAP pressures. His CPAP was set 6-10 cm water pressure and in the sleep study they had to increase it to 12 cm water pressure to treat his apnea. Advised the patient that we have made the increase in the max pressure to 15 cm water pressure to allow the machine to adjust as it needs to make sure he is getting treated with the correct pressure. Advised the patient we could see him anytime in month of April if an opening comes available otherwise he can keep the May 6,2020 apt and we will re assess his daytime sleepiness at that time after we see his download under the new settings. Pt verbalized understanding.

## 2018-11-05 ENCOUNTER — Ambulatory Visit (HOSPITAL_COMMUNITY): Payer: Self-pay | Admitting: Licensed Clinical Social Worker

## 2018-11-10 ENCOUNTER — Ambulatory Visit (HOSPITAL_COMMUNITY): Payer: Self-pay | Admitting: Licensed Clinical Social Worker

## 2018-11-28 ENCOUNTER — Telehealth: Payer: Self-pay | Admitting: Family Medicine

## 2018-11-28 ENCOUNTER — Other Ambulatory Visit: Payer: Self-pay | Admitting: *Deleted

## 2018-11-28 MED ORDER — IBUPROFEN 600 MG PO TABS: 600.0000 mg | ORAL_TABLET | Freq: Three times a day (TID) | ORAL | 1 refills | Status: DC | PRN

## 2018-11-28 NOTE — Telephone Encounter (Signed)
Pt is currently having a migraine and would like to have a refill on his ibuprofen (ADVIL,MOTRIN) 600 MG tablet   [id number] - [place], [address] AT [address]

## 2018-11-28 NOTE — Telephone Encounter (Signed)
Refills sent and pt notified.  

## 2018-11-28 NOTE — Telephone Encounter (Signed)
Ibuprofen 600 mg, 1 pill 3 times daily as needed pain, #45, 1 refill

## 2018-12-01 ENCOUNTER — Ambulatory Visit (INDEPENDENT_AMBULATORY_CARE_PROVIDER_SITE_OTHER): Payer: Medicaid Other | Admitting: Psychiatry

## 2018-12-01 ENCOUNTER — Other Ambulatory Visit: Payer: Self-pay

## 2018-12-01 ENCOUNTER — Encounter (HOSPITAL_COMMUNITY): Payer: Self-pay | Admitting: Psychiatry

## 2018-12-01 DIAGNOSIS — F902 Attention-deficit hyperactivity disorder, combined type: Secondary | ICD-10-CM | POA: Diagnosis not present

## 2018-12-01 DIAGNOSIS — Z79899 Other long term (current) drug therapy: Secondary | ICD-10-CM

## 2018-12-01 MED ORDER — GUANFACINE HCL 1 MG PO TABS: 1.0000 mg | ORAL_TABLET | Freq: Every day | ORAL | 2 refills | Status: DC

## 2018-12-01 NOTE — Progress Notes (Signed)
Virtual Visit via Telephone Note  I connected with [name] on 12/01/18 at  9:00 AM EDT by telephone and verified that I am speaking with the correct person using two identifiers.   I discussed the limitations, risks, security and privacy concerns of performing an evaluation and management service by telephone and the availability of in person appointments. I also discussed with the patient that there may be a patient responsible charge related to this service. The patient expressed understanding and agreed to proceed.     I discussed the assessment and treatment plan with the patient. The patient was provided an opportunity to ask questions and all were answered. The patient agreed with the plan and demonstrated an understanding of the instructions.   The patient was advised to call back or seek an in-person evaluation if the symptoms worsen or if the condition fails to improve as anticipated.  I provided 15 minutes of non-face-to-face time during this encounter.   Levonne Spiller, MD  [facility] MD/PA/NP OP Progress Note  12/01/2018 9:46 AM [name]  MRN:  [id number]  Chief Complaint:  Chief Complaint    ADD; Follow-up     HPI: patient is a28 year old divorcedwhite male who has been living with his Children in [place] He iscurrently unemployed  His mother reports that he was always a very hyperactive child wouldn't listen and acted out. He was constantly in trouble in school. He started on Ritalin at an early age but it made him like a zombie and he lost a lot of weight. Over the years she's been on numerous stimulants. At age 28 his father took custody of him. He did not do well with his father and was very oppositional. He was eventually placed in a group home and went through 3 group homes to his teen years.  At age 28 he was arrested for continued contributing to do the deliquency of a minor. He was dating a 27 year old girl who ran away from home and was found with  him he spent about 40 days in jail but he claims "turn my life around." Shortly after getting out of jail his father got ill with cancer and he spent a lot of time with him and was glad that they could reunite. The patient states he's currently doing well. He's under good control of his behavior. He's not particularly impulsive and is stable to stay focused.  The patient is assessed after 3 months via telephone to the coronavirus pandemic.  He states that he is doing okay.  He is staying home with his children.  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  He has been helping the children with her schoolwork and playing with them outdoors.  For the most part his mood is been fairly good and he still thinks the Intuniv 1 mg daily is helping with his focus. Visit Diagnosis:    ICD-10-CM   1. ADHD (attention deficit hyperactivity disorder), combined type F90.2     Past Psychiatric History: Past outpatient treatment for ADHD  Past Medical History:  Past Medical History:  Diagnosis Date  . ADHD (attention deficit hyperactivity disorder)    no current med.  . Asthma    prn inhaler  . Depression   . Migraines   . 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 OCD (obsessive compulsive disorder)   . Oppositional defiant disorder   .  PTSD (post-traumatic stress disorder)   . Stuffy nose 07/01/2017    Past Surgical History:  Procedure Laterality Date  . GANGLION CYST EXCISION Right 07/03/2017   Procedure: RIGHT HAND NEUROMA REMOVAL;  Surgeon: Leandrew Koyanagi, MD;  Location: [facility];  Service: Orthopedics;  Laterality: Right;  . neuroma of hand removed    . TENDON LENGTHENING Bilateral    as a child  . TOOTH EXTRACTION      Family Psychiatric History: See below  Family History:  Family History  Problem Relation Age of Onset  . Bipolar disorder Cousin   . Alcohol abuse Paternal  Aunt   . Drug abuse Paternal Aunt   . Diabetes Mother   . Heart attack Father   . Cancer Father     Social History:  Social History   Socioeconomic History  . Marital status: Single    Spouse name: Not on file  . Number of children: Not on file  . Years of education: Not on file  . Highest education level: Not on file  Occupational History  . Not on file  Social Needs  . Financial resource strain: Not on file  . Food insecurity:    Worry: Not on file    Inability: Not on file  . Transportation needs:    Medical: Not on file    Non-medical: Not on file  Tobacco Use  . Smoking status: Former Smoker    Years: 0.00    Last attempt to quit: 06/19/2013    Years since quitting: 5.4  . Smokeless tobacco: Former Systems developer    Types: [place] date: 02/12/2016  Substance and Sexual Activity  . Alcohol use: Yes    Frequency: Never    Comment: occasional  . Drug use: No  . Sexual activity: Yes    Partners: Female  Lifestyle  . Physical activity:    Days per week: Not on file    Minutes per session: Not on file  . Stress: Not on file  Relationships  . Social connections:    Talks on phone: Not on file    Gets together: Not on file    Attends religious service: Not on file    Active member of club or organization: Not on file    Attends meetings of clubs or organizations: Not on file    Relationship status: Not on file  Other Topics Concern  . Not on file  Social History Narrative  . Not on file    Allergies:  Allergies  Allergen Reactions  . Strattera [Atomoxetine Hcl] Other (See Comments)    CAUSED AGGRESSION  . Sulfa Antibiotics Hives    Metabolic Disorder Labs: Lab Results  Component Value Date   HGBA1C 5.3 01/31/2018   MPG 103 05/13/2008   No results found for: PROLACTIN Lab Results  Component Value Date   CHOL 220 (H) 01/31/2018   TRIG 183 (H) 01/31/2018   HDL 39 (L) 01/31/2018   CHOLHDL 5.6 (H) 01/31/2018   VLDL 32 09/22/2013   LDLCALC 144 (H)  01/31/2018   LDLCALC 175 (H) 08/01/2017   Lab Results  Component Value Date   TSH 0.774 08/01/2017   TSH 0.862 10/01/2015    Therapeutic Level Labs: No results found for: LITHIUM Lab Results  Component Value Date   VALPROATE 79.6 05/12/2008   No components found for:  CBMZ  Current Medications: Current Outpatient Medications  Medication Sig Dispense Refill  . albuterol (PROVENTIL HFA;VENTOLIN HFA)  108 (90 Base) MCG/ACT inhaler Inhale every 6 (six) hours as needed into the lungs for wheezing or shortness of breath.    . guanFACINE (TENEX) 1 MG tablet Take 1 tablet (1 mg total) by mouth daily. 30 tablet 2  . ibuprofen (ADVIL,MOTRIN) 600 MG tablet Take 1 tablet (600 mg total) by mouth every 8 (eight) hours as needed for headache or moderate pain. 45 tablet 1  . loratadine (CLARITIN) 10 MG tablet Take 10 mg daily as needed by mouth for allergies.    . Phenylephrine-DM-GG-APAP (TYLENOL COLD/FLU SEVERE PO) Take by mouth. Takes prn (equate brand)     No current facility-administered medications for this visit.      Musculoskeletal: Strength & Muscle Tone: Not assessed, phone visit Gait & Station:  Patient leans:   Psychiatric Specialty Exam: Review of Systems  All other systems reviewed and are negative.   There were no vitals taken for this visit.There is no height or weight on file to calculate BMI.  General Appearance: NA  Eye Contact:  NA  Speech:  Clear and Coherent  Volume:  Normal  Mood:  Euthymic  Affect:  NA  Thought Process:  Goal Directed  Orientation:  Full (Time, Place, and Person)  Thought Content: Rumination   Suicidal Thoughts:  No  Homicidal Thoughts:  No  Memory:  Immediate;   Good Recent;   Good Remote;   Fair  Judgement:  Fair  Insight:  Fair  Psychomotor Activity:  Normal  Concentration:  Concentration: Fair and Attention Span: Fair  Recall:  Good  Fund of Knowledge: Fair  Language: Good  Akathisia:  No  Handed:  Right  AIMS (if indicated):  not done  Assets:  Communication Skills Desire for Improvement Physical Health Resilience Social Support Talents/Skills  ADL's:  Intact  Cognition: WNL  Sleep:  Good   Screenings: PHQ2-9     Office Visit from 10/31/2017 in [facility]  PHQ-2 Total Score  3  PHQ-9 Total Score  13       Assessment and Plan: This patient is a 28 year old male with a history of ADHD and a past history of significant impulsivity.  He has settled down a lot since he had his children and has to be more responsible.  He states Intuniv 1 mg daily is helping with his focus so we will continue this.  He will return to see me in 3 months   Levonne Spiller, MD 12/01/2018, 9:46 AM

## 2018-12-04 ENCOUNTER — Other Ambulatory Visit: Payer: Self-pay

## 2018-12-04 ENCOUNTER — Encounter (HOSPITAL_COMMUNITY): Payer: Self-pay | Admitting: Licensed Clinical Social Worker

## 2018-12-04 ENCOUNTER — Ambulatory Visit (INDEPENDENT_AMBULATORY_CARE_PROVIDER_SITE_OTHER): Payer: Medicaid Other | Admitting: Licensed Clinical Social Worker

## 2018-12-04 DIAGNOSIS — F902 Attention-deficit hyperactivity disorder, combined type: Secondary | ICD-10-CM | POA: Diagnosis not present

## 2018-12-04 DIAGNOSIS — F39 Unspecified mood [affective] disorder: Secondary | ICD-10-CM | POA: Diagnosis not present

## 2018-12-04 NOTE — Progress Notes (Signed)
Virtual Visit via Video Note  I connected with [name] on 12/04/18 at  9:30 AM EDT by a video enabled telemedicine application and verified that I am speaking with the correct person using two identifiers.   I discussed the limitations of evaluation and management by telemedicine and the availability of in person appointments. The patient expressed understanding and agreed to proceed.    I discussed the assessment and treatment plan with the patient. The patient was provided an opportunity to ask questions and all were answered. The patient agreed with the plan and demonstrated an understanding of the instructions.   The patient was advised to call back or seek an in-person evaluation if the symptoms worsen or if the condition fails to improve as anticipated.  I provided 50 minutes of non-face-to-face time during this encounter.   [name], LCAS-A    THERAPIST PROGRESS NOTE  Session Time: 9:30-10:30AM  Participation Level: Active  Behavioral Response: Well GroomedAlertEuthymic  Type of Therapy: Individual Therapy  Treatment Goals addressed: Diagnosis: Mood Disorder  Interventions: CBT and Strength-based  Summary: [name] is a 28 y.o. male who presents with hx of mood disorder, ADHD, and obsessive compulsive. He is active, alert, engaged in virtual session. He reports on his fluctuating mood, a new romantic relationship. He is upbeat and states he has been doing well. He has quit his job at [employer] due to concerns w/ working during [employer]. PT reports his children are happy and doing well. He is not anxious due to COVID-19.   Suicidal/Homicidal: Nowithout intent/plan  Therapist Response: Counselor uses open questions, active listening, and reflection. Counselor used encouragement and support to help PT continue his success w/ managing his emotions and decreasing his anger and tolerating his stress.  Plan: Return again in 12 weeks.  Diagnosis:   ICD-10-CM   1. ADHD (attention deficit hyperactivity disorder), combined type F90.2   2. Unspecified mood (affective) disorder [facility]) [place], LCAS-A 12/04/2018

## 2018-12-10 DIAGNOSIS — Z0289 Encounter for other administrative examinations: Secondary | ICD-10-CM

## 2018-12-17 ENCOUNTER — Telehealth: Payer: Self-pay

## 2018-12-17 NOTE — Telephone Encounter (Signed)
Spoke with the patient and he has given verbal consent to file his insurance and to do a doxy.me visit with Megan on 12/24/2018. E-mail, mobile number and carrier have been confirmed and sent.

## 2018-12-22 ENCOUNTER — Encounter: Payer: Self-pay | Admitting: Adult Health

## 2018-12-23 NOTE — Progress Notes (Signed)
PATIENT: [name] DOB: 15-May-1991  REASON FOR VISIT: follow up HISTORY FROM: patient  Virtual Visit via Video Note  I connected with [name] on 12/24/18 at  9:30 AM EDT by a video enabled telemedicine application located remotely at [facility] and verified that I am speaking with the correct person using two identifiers who was located at their own home.   I discussed the limitations of evaluation and management by telemedicine and the availability of in person appointments. The patient expressed understanding and agreed to proceed.   PATIENT: [name] DOB: [birth year]  REASON FOR VISIT: follow up HISTORY FROM: patient  HISTORY OF PRESENT ILLNESS: Today 12/23/18:  [name] is a 28 year old male with a history of obstructive sleep apnea on CPAP.  We completed a virtual visit for follow-up.  His CPAP download indicates that he uses machine 27 out of 30 days for compliance of 90%.  He uses machine greater than 4 hours each night.  On average he uses his machine 7 hours and 10 minutes.  His residual AHI is 1.3 on 6-15 cmH2O with EPR 3.  He does not have a significant leak.  He states that since we changed his pressure he has noted an improvement in his daytime sleepiness.  He still has some issues with sleepiness but it is improved.  He was working at [employer] but due to COVID-19 he is no longer employed.  He denies any new symptoms.  He returns today for evaluation.  HISTORY 03/27/18:  [name] is a 28 year old male with a history of obstructive sleep apnea on CPAP.  He returns today for follow-up.  His CPAP download shows that he uses machine 30 out of 30 days for compliance of 100%.  He uses machine every night greater than 4 hours.  On average he uses his machine 7 hours and 29 minutes.  His residual AHI is 1.6 on 6 to 10 cm of water with EPR of 2.  His leak in the 95th percentile is 15 L/min.  He reports that he does notice  the benefit with using the CPAP machine.  He reports that there is a small hole in his tubing and needs his hose replaced.  He returns today for an evaluation  REVIEW OF SYSTEMS: Out of a complete 14 system review of symptoms, the patient complains only of the following symptoms, and all other reviewed systems are negative.  See HPI  ALLERGIES: Allergies  Allergen Reactions  . Strattera [Atomoxetine Hcl] Other (See Comments)    CAUSED AGGRESSION  . Sulfa Antibiotics Hives    HOME MEDICATIONS: Outpatient Medications Prior to Visit  Medication Sig Dispense Refill  . albuterol (PROVENTIL HFA;VENTOLIN HFA) 108 (90 Base) MCG/ACT inhaler Inhale every 6 (six) hours as needed into the lungs for wheezing or shortness of breath.    . guanFACINE (TENEX) 1 MG tablet Take 1 tablet (1 mg total) by mouth daily. 30 tablet 2  . ibuprofen (ADVIL,MOTRIN) 600 MG tablet Take 1 tablet (600 mg total) by mouth every 8 (eight) hours as needed for headache or moderate pain. 45 tablet 1  . loratadine (CLARITIN) 10 MG tablet Take 10 mg daily as needed by mouth for allergies.    . Phenylephrine-DM-GG-APAP (TYLENOL COLD/FLU SEVERE PO) Take by mouth. Takes prn (equate brand)     No facility-administered medications prior to visit.     PAST MEDICAL HISTORY: Past Medical History:  Diagnosis Date  .  ADHD (attention deficit hyperactivity disorder)    no current med.  . Asthma    prn inhaler  . Depression   . Migraines   . 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 OCD (obsessive compulsive disorder)   . Oppositional defiant disorder   . PTSD (post-traumatic stress disorder)   . Stuffy nose 07/01/2017    PAST SURGICAL HISTORY: Past Surgical History:  Procedure Laterality Date  . GANGLION CYST EXCISION Right 07/03/2017   Procedure: RIGHT HAND NEUROMA REMOVAL;  Surgeon: Leandrew Koyanagi, MD;  Location: [place];  Service: Orthopedics;  Laterality: Right;  . neuroma of hand removed    . TENDON  LENGTHENING Bilateral    as a child  . TOOTH EXTRACTION      FAMILY HISTORY: Family History  Problem Relation Age of Onset  . Bipolar disorder Cousin   . Alcohol abuse Paternal Aunt   . Drug abuse Paternal Aunt   . Diabetes Mother   . Heart attack Father   . Cancer Father     SOCIAL HISTORY: Social History   Socioeconomic History  . Marital status: Single    Spouse name: Not on file  . Number of children: Not on file  . Years of education: Not on file  . Highest education level: Not on file  Occupational History  . Not on file  Social Needs  . Financial resource strain: Not on file  . Food insecurity:    Worry: Not on file    Inability: Not on file  . Transportation needs:    Medical: Not on file    Non-medical: Not on file  Tobacco Use  . Smoking status: Former Smoker    Years: 0.00    Last attempt to quit: 06/19/2013    Years since quitting: 5.5  . Smokeless tobacco: Former Systems developer    Types: [place] date: 02/12/2016  Substance and Sexual Activity  . Alcohol use: Yes    Frequency: Never    Comment: occasional  . Drug use: No  . Sexual activity: Yes    Partners: Female  Lifestyle  . Physical activity:    Days per week: Not on file    Minutes per session: Not on file  . Stress: Not on file  Relationships  . Social connections:    Talks on phone: Not on file    Gets together: Not on file    Attends religious service: Not on file    Active member of club or organization: Not on file    Attends meetings of clubs or organizations: Not on file    Relationship status: Not on file  . Intimate partner violence:    Fear of current or ex partner: Not on file    Emotionally abused: Not on file    Physically abused: Not on file    Forced sexual activity: Not on file  Other Topics Concern  . Not on file  Social History Narrative  . Not on file      PHYSICAL EXAM   Generalized: Well developed, in no acute distress   Neurological examination   Mentation: Alert oriented to time, place, history taking. Follows all commands speech and language fluent Cranial nerve II-XII: Facial symmetry noted. uvula tongue midline. Head turning and shoulder shrug  were normal and symmetric.  Mallampati 3+ Motor: Good strength throughout subjectively per patient. Sensory: Sensory testing is intact to soft touch on all 4 extremities. No evidence of  extinction is noted.  Coordination: Cerebellar testing reveals good finger-nose-finger  Gait and station: Gait is normal.  Able to stand without assistance. Reflexes: UTA.   DIAGNOSTIC DATA (LABS, IMAGING, TESTING) - I reviewed patient records, labs, notes, testing and imaging myself where available.  Lab Results  Component Value Date   WBC 12.3 (H) 07/05/2017   HGB 14.8 07/05/2017   HCT 44.1 07/05/2017   MCV 85.5 07/05/2017   PLT 256 07/05/2017      Component Value Date/Time   NA 144 01/31/2018 1149   K 5.1 01/31/2018 1149   CL 105 01/31/2018 1149   CO2 21 01/31/2018 1149   GLUCOSE 91 01/31/2018 1149   GLUCOSE 124 (H) 07/05/2017 1417   BUN 11 01/31/2018 1149   CREATININE 0.74 (L) 01/31/2018 1149   CREATININE 0.66 09/22/2013 1050   CALCIUM 9.7 01/31/2018 1149   PROT 7.8 01/31/2018 1149   ALBUMIN 4.6 01/31/2018 1149   AST 16 01/31/2018 1149   ALT 28 01/31/2018 1149   ALKPHOS 66 01/31/2018 1149   BILITOT 0.3 01/31/2018 1149   GFRNONAA 128 01/31/2018 1149   GFRAA 148 01/31/2018 1149   Lab Results  Component Value Date   CHOL 220 (H) 01/31/2018   HDL 39 (L) 01/31/2018   LDLCALC 144 (H) 01/31/2018   TRIG 183 (H) 01/31/2018   CHOLHDL 5.6 (H) 01/31/2018   Lab Results  Component Value Date   HGBA1C 5.3 01/31/2018   No results found for: [id number] Lab Results  Component Value Date   TSH 0.774 08/01/2017      ASSESSMENT AND PLAN 28 y.o. year old male  has a past medical history of ADHD (attention deficit hyperactivity disorder), Asthma, Depression, Migraines, Neuroma of hand  (06/2017), OCD (obsessive compulsive disorder), Oppositional defiant disorder, PTSD (post-traumatic stress disorder), and Stuffy nose (07/01/2017). here with:  1.  Obstructive sleep apnea on CPAP  The patient CPAP download shows a compliance and good treatment of his apnea.  He is encouraged to continue using the CPAP nightly and greater than 4 hours each night.  He is advised that he should change out his supplies every 3 to 6 months. he is advised that if his symptoms worsen or he develops new symptoms he should let us know.  He will follow-up in 1 year or sooner if needed.  I spent 15 minutes with the patient discussing his CPAP report, completing assessment and discussing plan of care  Ward Givens, MSN, NP-C 12/23/2018, 3:25 PM [facility] [address] [place], [place] [address] [phone]

## 2018-12-24 ENCOUNTER — Other Ambulatory Visit: Payer: Self-pay

## 2018-12-24 ENCOUNTER — Encounter: Payer: Self-pay | Admitting: Adult Health

## 2018-12-24 ENCOUNTER — Ambulatory Visit (INDEPENDENT_AMBULATORY_CARE_PROVIDER_SITE_OTHER): Payer: Medicaid Other | Admitting: Adult Health

## 2018-12-24 DIAGNOSIS — G4733 Obstructive sleep apnea (adult) (pediatric): Secondary | ICD-10-CM | POA: Diagnosis not present

## 2018-12-24 DIAGNOSIS — Z9989 Dependence on other enabling machines and devices: Secondary | ICD-10-CM | POA: Diagnosis not present

## 2018-12-25 DIAGNOSIS — G4733 Obstructive sleep apnea (adult) (pediatric): Secondary | ICD-10-CM | POA: Diagnosis not present

## 2018-12-25 NOTE — Progress Notes (Signed)
[facility] received cpap orders for pt per Charmian Muff.  sy

## 2019-01-07 ENCOUNTER — Telehealth: Payer: Self-pay

## 2019-01-07 NOTE — Telephone Encounter (Signed)
Follow up has been scheduled. Patient is aware of appt day and time.   

## 2019-02-03 DIAGNOSIS — Z029 Encounter for administrative examinations, unspecified: Secondary | ICD-10-CM

## 2019-03-02 ENCOUNTER — Ambulatory Visit (INDEPENDENT_AMBULATORY_CARE_PROVIDER_SITE_OTHER): Payer: Medicaid Other | Admitting: Psychiatry

## 2019-03-02 ENCOUNTER — Encounter (HOSPITAL_COMMUNITY): Payer: Self-pay | Admitting: Psychiatry

## 2019-03-02 ENCOUNTER — Other Ambulatory Visit: Payer: Self-pay

## 2019-03-02 DIAGNOSIS — F902 Attention-deficit hyperactivity disorder, combined type: Secondary | ICD-10-CM

## 2019-03-02 MED ORDER — GUANFACINE HCL 1 MG PO TABS: 1.0000 mg | ORAL_TABLET | Freq: Every day | ORAL | 2 refills | Status: DC

## 2019-03-02 NOTE — Progress Notes (Signed)
Virtual Visit via Telephone Note  I connected with [name] on 03/02/19 at 10:40 AM EDT by telephone and verified that I am speaking with the correct person using two identifiers.   I discussed the limitations, risks, security and privacy concerns of performing an evaluation and management service by telephone and the availability of in person appointments. I also discussed with the patient that there may be a patient responsible charge related to this service. The patient expressed understanding and agreed to proceed.      I discussed the assessment and treatment plan with the patient. The patient was provided an opportunity to ask questions and all were answered. The patient agreed with the plan and demonstrated an understanding of the instructions.   The patient was advised to call back or seek an in-person evaluation if the symptoms worsen or if the condition fails to improve as anticipated.  I provided 15 minutes of non-face-to-face time during this encounter.   Levonne Spiller, MD  [facility] MD/PA/NP OP Progress Note  03/02/2019 1:40 PM [name]  MRN:  [id number]  Chief Complaint: ADHD HPI: patient is a28 year old divorcedwhite male who has been living with his Children in [place] He iscurrently unemployed  His mother reports that he was always a very hyperactive child wouldn't listen and acted out. He was constantly in trouble in school. He started on Ritalin at an early age but it made him like a zombie and he lost a lot of weight. Over the years she's been on numerous stimulants. At age 28 his father took custody of him. He did not do well with his father and was very oppositional. He was eventually placed in a group home and went through 3 group homes to his teen years.  At age 28 he was arrested for continued contributing to do the deliquency of a minor. He was dating a 27 year old girl who ran away from home and was found with him he spent about 40 days in jail  but he claims "turn my life around." Shortly after getting out of jail his father got ill with cancer and he spent a lot of time with him and was glad that they could reunite. The patient states he's currently doing well. He's under good control of his behavior. He's not particularly impulsive and is stable to stay focused.  The patient returns for follow-up after 3 months.  He states that he continues to do fairly well.  He is no longer working but has been looking for a job.  He is mostly just spending time taking care of his children.  He denies significant depression or anxiety.  He states that the guanfacine still seems to help with his focus.  He is sleeping well and denies any thoughts of self-harm or suicidal ideation Visit Diagnosis:    ICD-10-CM   1. ADHD (attention deficit hyperactivity disorder), combined type  F90.2     Past Psychiatric History: Past outpatient treatment for ADHD  Past Medical History:  Past Medical History:  Diagnosis Date  . ADHD (attention deficit hyperactivity disorder)    no current med.  . Asthma    prn inhaler  . Depression   . Migraines   . 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 OCD (obsessive compulsive disorder)   . Oppositional defiant disorder   . PTSD (post-traumatic stress disorder)   . Stuffy nose 07/01/2017    Past Surgical History:  Procedure Laterality Date  . GANGLION CYST EXCISION Right  07/03/2017   Procedure: RIGHT HAND NEUROMA REMOVAL;  Surgeon: Leandrew Koyanagi, MD;  Location: [facility];  Service: Orthopedics;  Laterality: Right;  . neuroma of hand removed    . TENDON LENGTHENING Bilateral    as a child  . TOOTH EXTRACTION      Family Psychiatric History: See below  Family History:  Family History  Problem Relation Age of Onset  . Bipolar disorder Cousin   . Alcohol abuse Paternal Aunt   . Drug abuse Paternal Aunt   . Diabetes Mother   . Heart attack Father   . Cancer Father     Social History:  Social  History   Socioeconomic History  . Marital status: Single    Spouse name: Not on file  . Number of children: Not on file  . Years of education: Not on file  . Highest education level: Not on file  Occupational History  . Not on file  Social Needs  . Financial resource strain: Not on file  . Food insecurity    Worry: Not on file    Inability: Not on file  . Transportation needs    Medical: Not on file    Non-medical: Not on file  Tobacco Use  . Smoking status: Former Smoker    Years: 0.00    Quit date: 06/19/2013    Years since quitting: 5.7  . Smokeless tobacco: Former Systems developer    Types: [place] date: 02/12/2016  Substance and Sexual Activity  . Alcohol use: Yes    Frequency: Never    Comment: occasional  . Drug use: No  . Sexual activity: Yes    Partners: Female  Lifestyle  . Physical activity    Days per week: Not on file    Minutes per session: Not on file  . Stress: Not on file  Relationships  . Social Herbalist on phone: Not on file    Gets together: Not on file    Attends religious service: Not on file    Active member of club or organization: Not on file    Attends meetings of clubs or organizations: Not on file    Relationship status: Not on file  Other Topics Concern  . Not on file  Social History Narrative  . Not on file    Allergies:  Allergies  Allergen Reactions  . Strattera [Atomoxetine Hcl] Other (See Comments)    CAUSED AGGRESSION  . Sulfa Antibiotics Hives    Metabolic Disorder Labs: Lab Results  Component Value Date   HGBA1C 5.3 01/31/2018   MPG 103 05/13/2008   No results found for: PROLACTIN Lab Results  Component Value Date   CHOL 220 (H) 01/31/2018   TRIG 183 (H) 01/31/2018   HDL 39 (L) 01/31/2018   CHOLHDL 5.6 (H) 01/31/2018   VLDL 32 09/22/2013   LDLCALC 144 (H) 01/31/2018   LDLCALC 175 (H) 08/01/2017   Lab Results  Component Value Date   TSH 0.774 08/01/2017   TSH 0.862 10/01/2015    Therapeutic  Level Labs: No results found for: LITHIUM Lab Results  Component Value Date   VALPROATE 79.6 05/12/2008   No components found for:  CBMZ  Current Medications: Current Outpatient Medications  Medication Sig Dispense Refill  . albuterol (PROVENTIL HFA;VENTOLIN HFA) 108 (90 Base) MCG/ACT inhaler Inhale every 6 (six) hours as needed into the lungs for wheezing or shortness of breath.    . guanFACINE (TENEX) 1  MG tablet Take 1 tablet (1 mg total) by mouth daily. 30 tablet 2  . ibuprofen (ADVIL,MOTRIN) 600 MG tablet Take 1 tablet (600 mg total) by mouth every 8 (eight) hours as needed for headache or moderate pain. 45 tablet 1  . loratadine (CLARITIN) 10 MG tablet Take 10 mg daily as needed by mouth for allergies.    . Phenylephrine-DM-GG-APAP (TYLENOL COLD/FLU SEVERE PO) Take by mouth. Takes prn (equate brand)     No current facility-administered medications for this visit.      Musculoskeletal: Strength & Muscle Tone: within normal limits Gait & Station: normal Patient leans: N/A  Psychiatric Specialty Exam: Review of Systems  All other systems reviewed and are negative.   There were no vitals taken for this visit.There is no height or weight on file to calculate BMI.  General Appearance: NA  Eye Contact:  NA  Speech:  Clear and Coherent  Volume:  Normal  Mood:  Euthymic  Affect:  NA  Thought Process:  Goal Directed  Orientation:  Full (Time, Place, and Person)  Thought Content: WDL   Suicidal Thoughts:  No  Homicidal Thoughts:  No  Memory:  Immediate;   Good Recent;   Good Remote;   Fair  Judgement:  Fair  Insight:  Shallow  Psychomotor Activity:  Normal  Concentration:  Concentration: Good and Attention Span: Good  Recall:  Good  Fund of Knowledge: Fair  Language: Good  Akathisia:  No  Handed:  Right  AIMS (if indicated): not done  Assets:  Communication Skills Desire for Improvement Physical Health Resilience Social Support Talents/Skills  ADL's:  Intact   Cognition: WNL  Sleep:  Good   Screenings: PHQ2-9     Office Visit from 10/31/2017 in [facility]  PHQ-2 Total Score  3  PHQ-9 Total Score  13       Assessment and Plan: This patient is a 28 year old male with a history of ADHD.  By his report he continues to do well with guanfacine 1 mg daily.  He will continue this dosage and return to see me in 3 months   Levonne Spiller, MD 03/02/2019, 1:40 PM

## 2019-03-23 ENCOUNTER — Telehealth: Payer: Self-pay | Admitting: Family Medicine

## 2019-03-23 MED ORDER — ALBUTEROL SULFATE HFA 108 (90 BASE) MCG/ACT IN AERS: 2.0000 | INHALATION_SPRAY | Freq: Four times a day (QID) | RESPIRATORY_TRACT | 2 refills | Status: DC | PRN

## 2019-03-23 NOTE — Telephone Encounter (Signed)
4 refills please

## 2019-03-23 NOTE — Telephone Encounter (Signed)
Pt requesting refill on albuterol (PROVENTIL HFA;VENTOLIN HFA) 108 (90 Base) MCG/ACT inhaler  [facility]

## 2019-03-23 NOTE — Telephone Encounter (Addendum)
Prescription sent electronically to pharmacy. Patient notified. 

## 2019-04-15 ENCOUNTER — Other Ambulatory Visit: Payer: Self-pay

## 2019-04-17 ENCOUNTER — Other Ambulatory Visit: Payer: Self-pay

## 2019-04-18 ENCOUNTER — Other Ambulatory Visit: Payer: Self-pay

## 2019-04-20 ENCOUNTER — Other Ambulatory Visit: Payer: Self-pay

## 2019-04-20 ENCOUNTER — Ambulatory Visit (INDEPENDENT_AMBULATORY_CARE_PROVIDER_SITE_OTHER): Payer: Medicaid Other | Admitting: Family Medicine

## 2019-04-20 ENCOUNTER — Telehealth: Payer: Self-pay | Admitting: Family Medicine

## 2019-04-20 DIAGNOSIS — M545 Low back pain, unspecified: Secondary | ICD-10-CM

## 2019-04-20 MED ORDER — DICLOFENAC SODIUM 75 MG PO TBEC: DELAYED_RELEASE_TABLET | ORAL | 2 refills | Status: DC

## 2019-04-20 NOTE — Progress Notes (Signed)
   Subjective:    Patient ID: [name], male    DOB: [birth year], 28 y.o.   MRN: [id number] Video visit HPI  Patient calls with lower back pain for months. Mo know injury. Patient relates that he has low mid back pain does not radiate down the legs hurts when he bends hurts when he straightens it is been going on for months he states he is tried some exercises without having any significant success he states he has had increased discomfort recently is tried ibuprofen but it does not seem to help any he denies any major setbacks denies any injuries he does state when he sits crosslegged get on the floor his legs go numb but otherwise a do not go numb PMH benign Virtual Visit via Video Note  I connected with [name] on 04/20/19 at  8:30 AM EDT by a video enabled telemedicine application and verified that I am speaking with the correct person using two identifiers.  Location: Patient: home Provider: office    I discussed the limitations of evaluation and management by telemedicine and the availability of in person appointments. The patient expressed understanding and agreed to proceed.  History of Present Illness:    Observations/Objective:   Assessment and Plan:   Follow Up Instructions:    I discussed the assessment and treatment plan with the patient. The patient was provided an opportunity to ask questions and all were answered. The patient agreed with the plan and demonstrated an understanding of the instructions.   The patient was advised to call back or seek an in-person evaluation if the symptoms worsen or if the condition fails to improve as anticipated.  I provided 17 minutes of non-face-to-face time during this encounter.     Review of Systems  Constitutional: Negative for activity change.  HENT: Negative for congestion and rhinorrhea.   Respiratory: Negative for cough and shortness of breath.   Cardiovascular: Negative for chest pain.   Gastrointestinal: Negative for abdominal pain, diarrhea, nausea and vomiting.  Genitourinary: Negative for dysuria and hematuria.  Musculoskeletal: Positive for back pain. Negative for arthralgias.  Neurological: Negative for weakness and headaches.  Psychiatric/Behavioral: Negative for behavioral problems and confusion.       Objective:     Patient had virtual visit Appears to be in no distress Atraumatic Neuro able to relate and oriented No apparent resp distress Color normal Patient points to the lower lumbar area hurts with bending and standing     Assessment & Plan:  Lower lumbar pain [place] Exercises recommended He states he has been trying some Because it is been going on for months we will do x-rays of the lower back Will do a follow-up visit in 3 to 4 weeks Sent in new anti-inflammatory

## 2019-04-20 NOTE — Telephone Encounter (Signed)
Pt came in to ask if his x-ray orders had been placed so he could go get them done, please call when they're ready

## 2019-04-20 NOTE — Telephone Encounter (Signed)
Please advise. Thank you

## 2019-04-20 NOTE — Telephone Encounter (Signed)
Xray orders have been put in by nurse. Contacted pt to inform him of orders. Pt verbalized understanding

## 2019-04-21 ENCOUNTER — Other Ambulatory Visit: Payer: Self-pay

## 2019-04-21 ENCOUNTER — Ambulatory Visit (HOSPITAL_COMMUNITY)
Admission: RE | Admit: 2019-04-21 | Discharge: 2019-04-21 | Disposition: A | Discharge status: Home / Self Care | Payer: Medicaid Other | Source: Ambulatory Visit | Attending: Family Medicine | Admitting: Family Medicine

## 2019-04-21 DIAGNOSIS — M545 Low back pain: Secondary | ICD-10-CM | POA: Insufficient documentation

## 2019-05-27 ENCOUNTER — Other Ambulatory Visit: Payer: Self-pay

## 2019-05-27 ENCOUNTER — Encounter (HOSPITAL_COMMUNITY): Payer: Self-pay | Admitting: Psychiatry

## 2019-05-27 ENCOUNTER — Ambulatory Visit (INDEPENDENT_AMBULATORY_CARE_PROVIDER_SITE_OTHER): Payer: Medicaid Other | Admitting: Psychiatry

## 2019-05-27 DIAGNOSIS — F902 Attention-deficit hyperactivity disorder, combined type: Secondary | ICD-10-CM | POA: Diagnosis not present

## 2019-05-27 NOTE — Progress Notes (Signed)
Virtual Visit via Telephone Note  I connected with [name] on 05/27/19 at  3:00 PM EDT by telephone and verified that I am speaking with the correct person using two identifiers.   I discussed the limitations, risks, security and privacy concerns of performing an evaluation and management service by telephone and the availability of in person appointments. I also discussed with the patient that there may be a patient responsible charge related to this service. The patient expressed understanding and agreed to proceed.     I discussed the assessment and treatment plan with the patient. The patient was provided an opportunity to ask questions and all were answered. The patient agreed with the plan and demonstrated an understanding of the instructions.   The patient was advised to call back or seek an in-person evaluation if the symptoms worsen or if the condition fails to improve as anticipated.  I provided 15 minutes of non-face-to-face time during this encounter.   Levonne Spiller, MD  [facility] MD/PA/NP OP Progress Note  05/27/2019 3:02 PM [name]  MRN:  [id number]  Chief Complaint:  Chief Complaint    ADD; Follow-up     [id number] is a28 year old divorcedwhite male who has been living with his Children in [place] He iscurrently unemployed  His mother reports that he was always a very hyperactive child wouldn't listen and acted out. He was constantly in trouble in school. He started on Ritalin at an early age but it made him like a zombie and he lost a lot of weight. Over the years she's been on numerous stimulants. At age 84 his father took custody of him. He did not do well with his father and was very oppositional. He was eventually placed in a group home and went through 3 group homes to his teen years.  At age 80 he was arrested for continued contributing to do the deliquency of a minor. He was dating a 27 year old girl who ran away from home and was found with  him he spent about 40 days in jail but he claims "turn my life around." Shortly after getting out of jail his father got ill with cancer and he spent a lot of time with him and was glad that they could reunite. The patient states he's currently doing well. He's under good control of his behavior. He's not particularly impulsive and is stable to stay focused.  The patient returns for follow-up after 3 months.  He states that he is doing well.  His daughter is in kindergarten and his son in [place] and he is trying to help both of them when they have virtual learning days.  They are staying with him during the week.  He still has not found employment but he is looking.  He states his mood is good.  He is not very compliant with the Intuniv.  He simply forgets to take it.  He states he does okay without it.  He claims he is a little bit more focused with it but there is no point in prescribing it if he is not going to take it.  He states he would rather deal without it at this point. Visit Diagnosis:    ICD-10-CM   1. ADHD (attention deficit hyperactivity disorder), combined type  F90.2     Past Psychiatric History: Past outpatient treatment for ADHD  Past Medical History:  Past Medical History:  Diagnosis Date  . ADHD (attention deficit hyperactivity disorder)    no current med.  [place]  Asthma    prn inhaler  . Depression   . Migraines   . 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 OCD (obsessive compulsive disorder)   . Oppositional defiant disorder   . PTSD (post-traumatic stress disorder)   . Stuffy nose 07/01/2017    Past Surgical History:  Procedure Laterality Date  . GANGLION CYST EXCISION Right 07/03/2017   Procedure: RIGHT HAND NEUROMA REMOVAL;  Surgeon: Leandrew Koyanagi, MD;  Location: [facility];  Service: Orthopedics;  Laterality: Right;  . neuroma of hand removed    . TENDON LENGTHENING Bilateral    as a child  . TOOTH EXTRACTION      Family Psychiatric History: see  below  Family History:  Family History  Problem Relation Age of Onset  . Bipolar disorder Cousin   . Alcohol abuse Paternal Aunt   . Drug abuse Paternal Aunt   . Diabetes Mother   . Heart attack Father   . Cancer Father     Social History:  Social History   Socioeconomic History  . Marital status: Single    Spouse name: Not on file  . Number of children: Not on file  . Years of education: Not on file  . Highest education level: Not on file  Occupational History  . Not on file  Social Needs  . Financial resource strain: Not on file  . Food insecurity    Worry: Not on file    Inability: Not on file  . Transportation needs    Medical: Not on file    Non-medical: Not on file  Tobacco Use  . Smoking status: Former Smoker    Years: 0.00    Quit date: 06/19/2013    Years since quitting: 5.9  . Smokeless tobacco: Former Systems developer    Types: [place] date: 02/12/2016  Substance and Sexual Activity  . Alcohol use: Yes    Frequency: Never    Comment: occasional  . Drug use: No  . Sexual activity: Yes    Partners: Female  Lifestyle  . Physical activity    Days per week: Not on file    Minutes per session: Not on file  . Stress: Not on file  Relationships  . Social Herbalist on phone: Not on file    Gets together: Not on file    Attends religious service: Not on file    Active member of club or organization: Not on file    Attends meetings of clubs or organizations: Not on file    Relationship status: Not on file  Other Topics Concern  . Not on file  Social History Narrative  . Not on file    Allergies:  Allergies  Allergen Reactions  . Strattera [Atomoxetine Hcl] Other (See Comments)    CAUSED AGGRESSION  . Sulfa Antibiotics Hives    Metabolic Disorder Labs: Lab Results  Component Value Date   HGBA1C 5.3 01/31/2018   MPG 103 05/13/2008   No results found for: PROLACTIN Lab Results  Component Value Date   CHOL 220 (H) 01/31/2018   TRIG  183 (H) 01/31/2018   HDL 39 (L) 01/31/2018   CHOLHDL 5.6 (H) 01/31/2018   VLDL 32 09/22/2013   LDLCALC 144 (H) 01/31/2018   LDLCALC 175 (H) 08/01/2017   Lab Results  Component Value Date   TSH 0.774 08/01/2017   TSH 0.862 10/01/2015    Therapeutic Level Labs: No results found for: LITHIUM  Lab Results  Component Value Date   VALPROATE 79.6 05/12/2008   No components found for:  CBMZ  Current Medications: Current Outpatient Medications  Medication Sig Dispense Refill  . albuterol (VENTOLIN HFA) 108 (90 Base) MCG/ACT inhaler Inhale 2 puffs into the lungs every 6 (six) hours as needed for wheezing or shortness of breath. 18 g 2  . diclofenac (VOLTAREN) 75 MG EC tablet 1 twice daily PRN 30 tablet 2  . ibuprofen (ADVIL,MOTRIN) 600 MG tablet Take 1 tablet (600 mg total) by mouth every 8 (eight) hours as needed for headache or moderate pain. 45 tablet 1  . loratadine (CLARITIN) 10 MG tablet Take 10 mg daily as needed by mouth for allergies.    . Phenylephrine-DM-GG-APAP (TYLENOL COLD/FLU SEVERE PO) Take by mouth. Takes prn (equate brand)     No current facility-administered medications for this visit.      Musculoskeletal: Strength & Muscle Tone: within normal limits Gait & Station: normal Patient leans: N/A  Psychiatric Specialty Exam: Review of Systems  All other systems reviewed and are negative.   There were no vitals taken for this visit.There is no height or weight on file to calculate BMI.  General Appearance: NA  Eye Contact:  NA  Speech:  Clear and Coherent  Volume:  Normal  Mood:  Euthymic  Affect:  Appropriate  Thought Process:  Goal Directed  Orientation:  Full (Time, Place, and Person)  Thought Content: WDL   Suicidal Thoughts:  No  Homicidal Thoughts:  No  Memory:  Immediate;   Good Recent;   Good Remote;   Fair  Judgement:  Fair  Insight:  Fair  Psychomotor Activity:  Normal  Concentration:  Concentration: Fair and Attention Span: Fair  Recall:   Good  Fund of Knowledge: Fair  Language: Good  Akathisia:  No  Handed:  Right  AIMS (if indicated): not done  Assets:  Communication Skills Desire for Improvement Physical Health Resilience Social Support Talents/Skills  ADL's:  Intact  Cognition: WNL  Sleep:  Good   Screenings: PHQ2-9     Office Visit from 10/31/2017 in [facility]  PHQ-2 Total Score  3  PHQ-9 Total Score  13       Assessment and Plan: This patient is a 28 year old male with a history of ADHD.  He has not had good luck with stimulants and seem to be doing fairly well with Intuniv but he simply cannot remember to take it and he would rather do without it.  I therefore told him we do not need to schedule any further appointments at this time but he is free to call me at any point if he would like to resume medication treatment for ADHD   Levonne Spiller, MD 05/27/2019, 3:02 PM

## 2019-06-03 ENCOUNTER — Ambulatory Visit (HOSPITAL_COMMUNITY): Payer: Medicaid Other | Admitting: Psychiatry

## 2019-06-09 ENCOUNTER — Encounter: Payer: Self-pay | Admitting: Family Medicine

## 2019-06-09 ENCOUNTER — Other Ambulatory Visit: Payer: Self-pay

## 2019-06-09 ENCOUNTER — Ambulatory Visit (INDEPENDENT_AMBULATORY_CARE_PROVIDER_SITE_OTHER): Payer: Medicaid Other | Admitting: Family Medicine

## 2019-06-09 DIAGNOSIS — R21 Rash and other nonspecific skin eruption: Secondary | ICD-10-CM | POA: Diagnosis not present

## 2019-06-09 MED ORDER — HYDROCORTISONE 2.5 % EX CREA: TOPICAL_CREAM | CUTANEOUS | 0 refills | Status: DC

## 2019-06-09 MED ORDER — KETOCONAZOLE 2 % EX CREA: TOPICAL_CREAM | CUTANEOUS | 0 refills | Status: DC

## 2019-06-09 NOTE — Progress Notes (Signed)
   Subjective:    Patient ID: [name], male    DOB: 1991/06/24, 27 y.o.   MRN: [id number]  HPI  Patient calls with spot on stomach of a week. The area looks whelped and bruised. Pictures were sent thru my chart.  Virtual Visit via Video Note  I connected with [name] on 06/09/19 at  4:10 PM EDT by a video enabled telemedicine application and verified that I am speaking with the correct person using two identifiers.  Location: Patient: home Provider: office   I discussed the limitations of evaluation and management by telemedicine and the availability of in person appointments. The patient expressed understanding and agreed to proceed.  History of Present Illness:    Observations/Objective:   Assessment and Plan:   Follow Up Instructions:    I discussed the assessment and treatment plan with the patient. The patient was provided an opportunity to ask questions and all were answered. The patient agreed with the plan and demonstrated an understanding of the instructions.   The patient was advised to call back or seek an in-person evaluation if the symptoms worsen or if the condition fails to improve as anticipated.  I provided 17 minutes of non-face-to-face time during this encounter.  Patient notes rash on anterior abdomen.  For started tiny then is growing.  However rash has a raised edge.  Central area has some thinning.  Itchy at first.  Now somewhat sensitive.  Has tried over-the-counter itch agents with minimal help no fever no chills no cough no-   Review of Systems     Objective:   Physical Exam  Virtual see images sent via [place]:  Impression probable tenia corporis plan add hydrocortisone 2.5 twice daily for 3 days for itching ketoconazole twice daily for therapeutic purposes rationale discussed symptom care discussed questions answered

## 2019-06-18 ENCOUNTER — Other Ambulatory Visit: Payer: Medicaid Other

## 2019-07-01 ENCOUNTER — Other Ambulatory Visit (INDEPENDENT_AMBULATORY_CARE_PROVIDER_SITE_OTHER): Payer: Medicaid Other | Admitting: *Deleted

## 2019-07-01 ENCOUNTER — Other Ambulatory Visit: Payer: Self-pay

## 2019-07-01 DIAGNOSIS — Z23 Encounter for immunization: Secondary | ICD-10-CM | POA: Diagnosis not present

## 2019-09-14 ENCOUNTER — Ambulatory Visit: Payer: Medicaid Other | Attending: Internal Medicine

## 2019-09-14 ENCOUNTER — Other Ambulatory Visit: Payer: Self-pay

## 2019-09-14 DIAGNOSIS — Z20822 Contact with and (suspected) exposure to covid-19: Secondary | ICD-10-CM

## 2019-09-15 LAB — NOVEL CORONAVIRUS, NAA: SARS-CoV-2, NAA: NOT DETECTED

## 2019-10-07 DIAGNOSIS — G4733 Obstructive sleep apnea (adult) (pediatric): Secondary | ICD-10-CM | POA: Diagnosis not present

## 2019-11-06 ENCOUNTER — Telehealth: Payer: Self-pay | Admitting: Family Medicine

## 2019-11-06 NOTE — Telephone Encounter (Signed)
Please advise. Thank you

## 2019-11-06 NOTE — Telephone Encounter (Signed)
Patient is requesting new prescription  For loratadine 10 mg to be called into [facility]

## 2019-11-08 NOTE — Telephone Encounter (Signed)
Loratadine 10 mg 1 daily, #30, 5 refills

## 2019-11-09 MED ORDER — LORATADINE 10 MG PO TABS: 10.0000 mg | ORAL_TABLET | Freq: Every day | ORAL | 5 refills | Status: DC

## 2019-11-09 NOTE — Telephone Encounter (Signed)
Prescription sent electronically to pharmacy. Left message to return call to notify patient. 

## 2019-11-09 NOTE — Telephone Encounter (Signed)
Patient notified

## 2019-11-28 ENCOUNTER — Encounter: Payer: Self-pay | Admitting: Adult Health

## 2019-12-14 IMAGING — DX DG LUMBAR SPINE COMPLETE 4+V
5 series · 5 of 5 positions shown · non-contrast
Comparison: None.

CLINICAL DATA: Back pain

EXAM:
LUMBAR SPINE - COMPLETE 4+ VIEW

[l-spine ap]
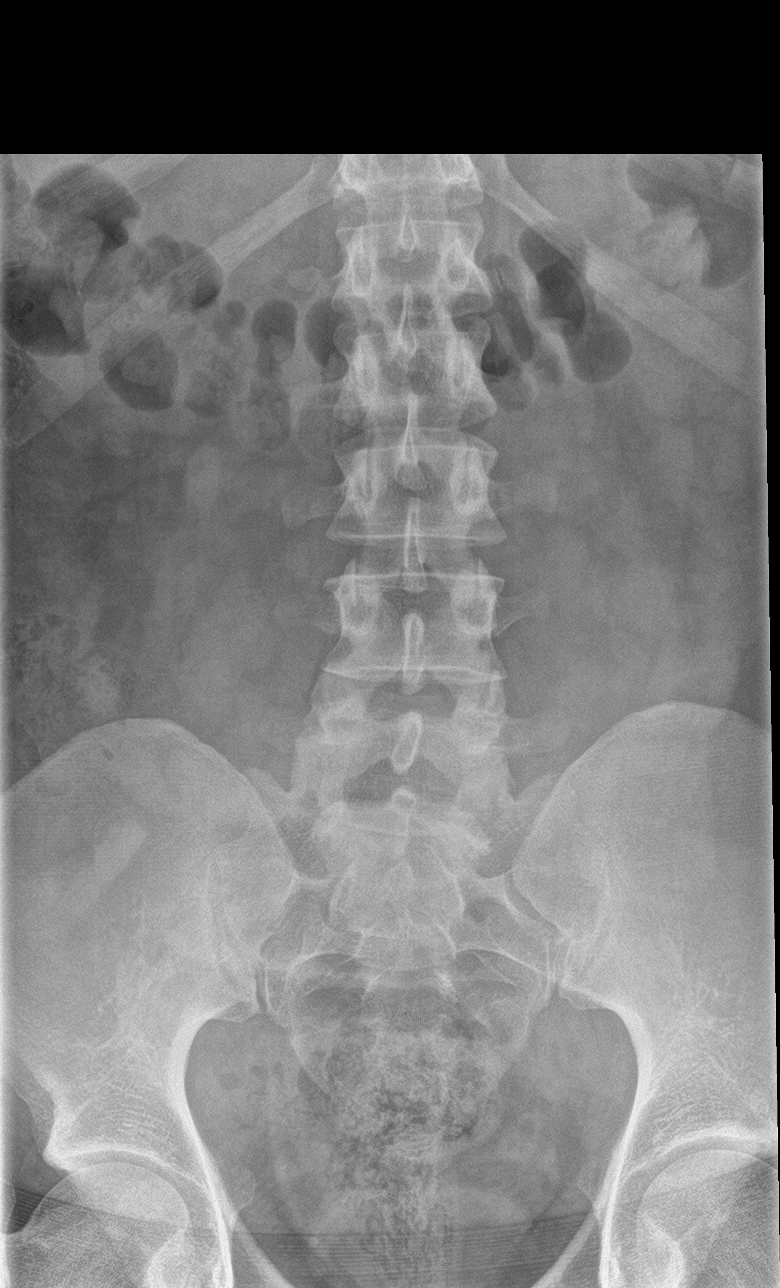

[l-spine obl (1 of 2)]
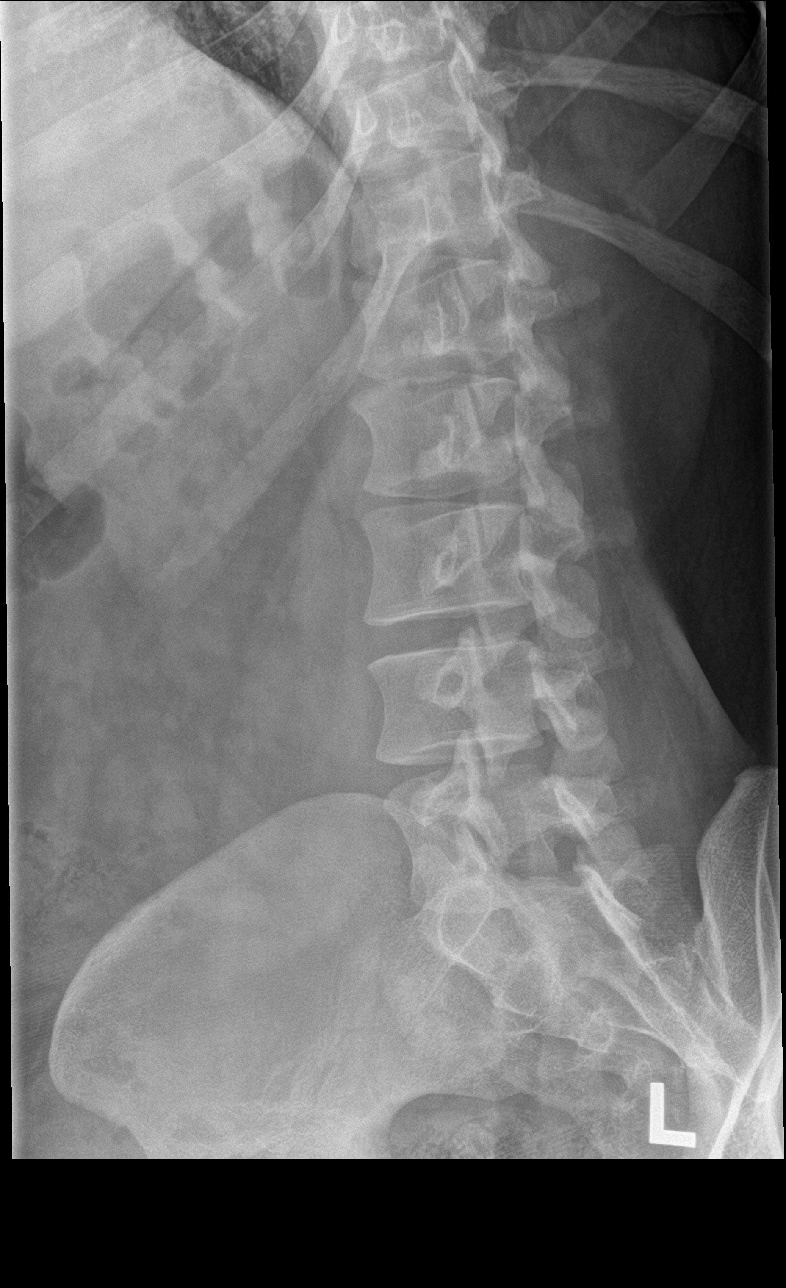

[l-spine obl (2 of 2)]
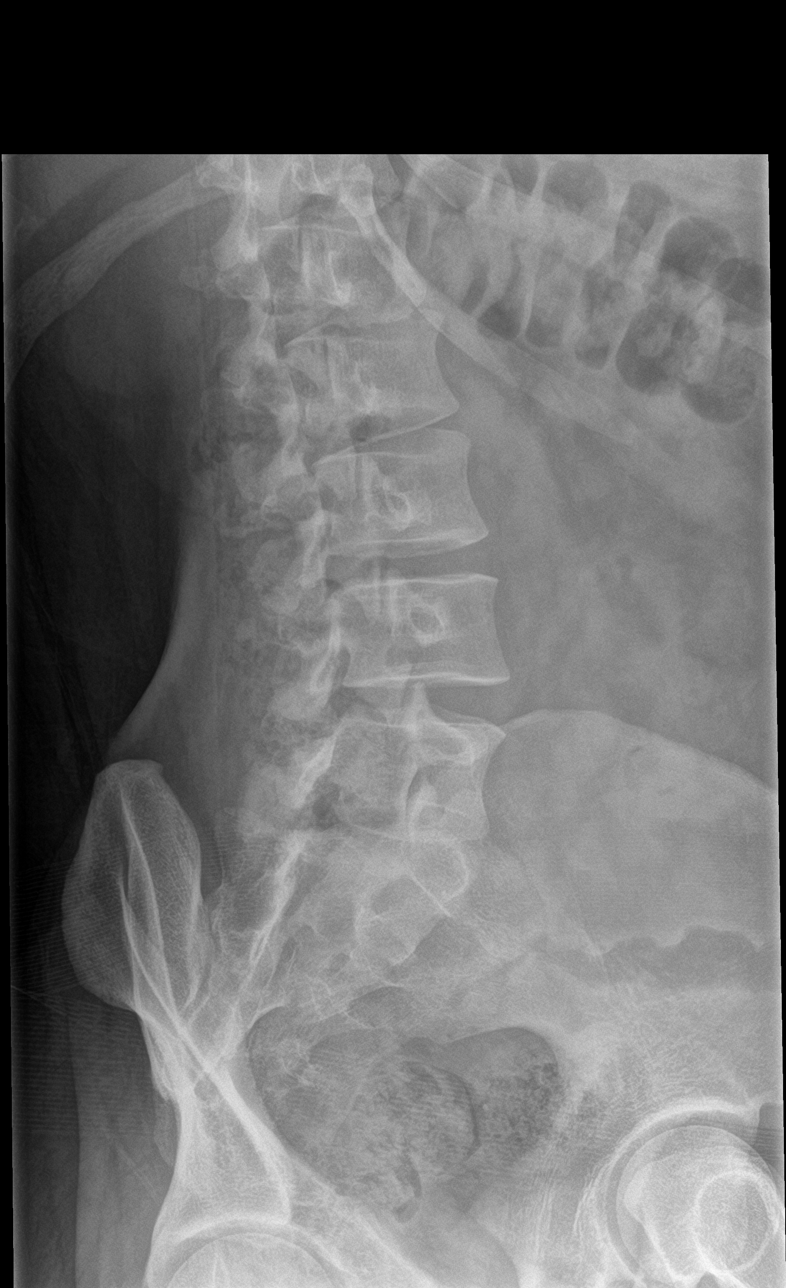

[l-spine lat]
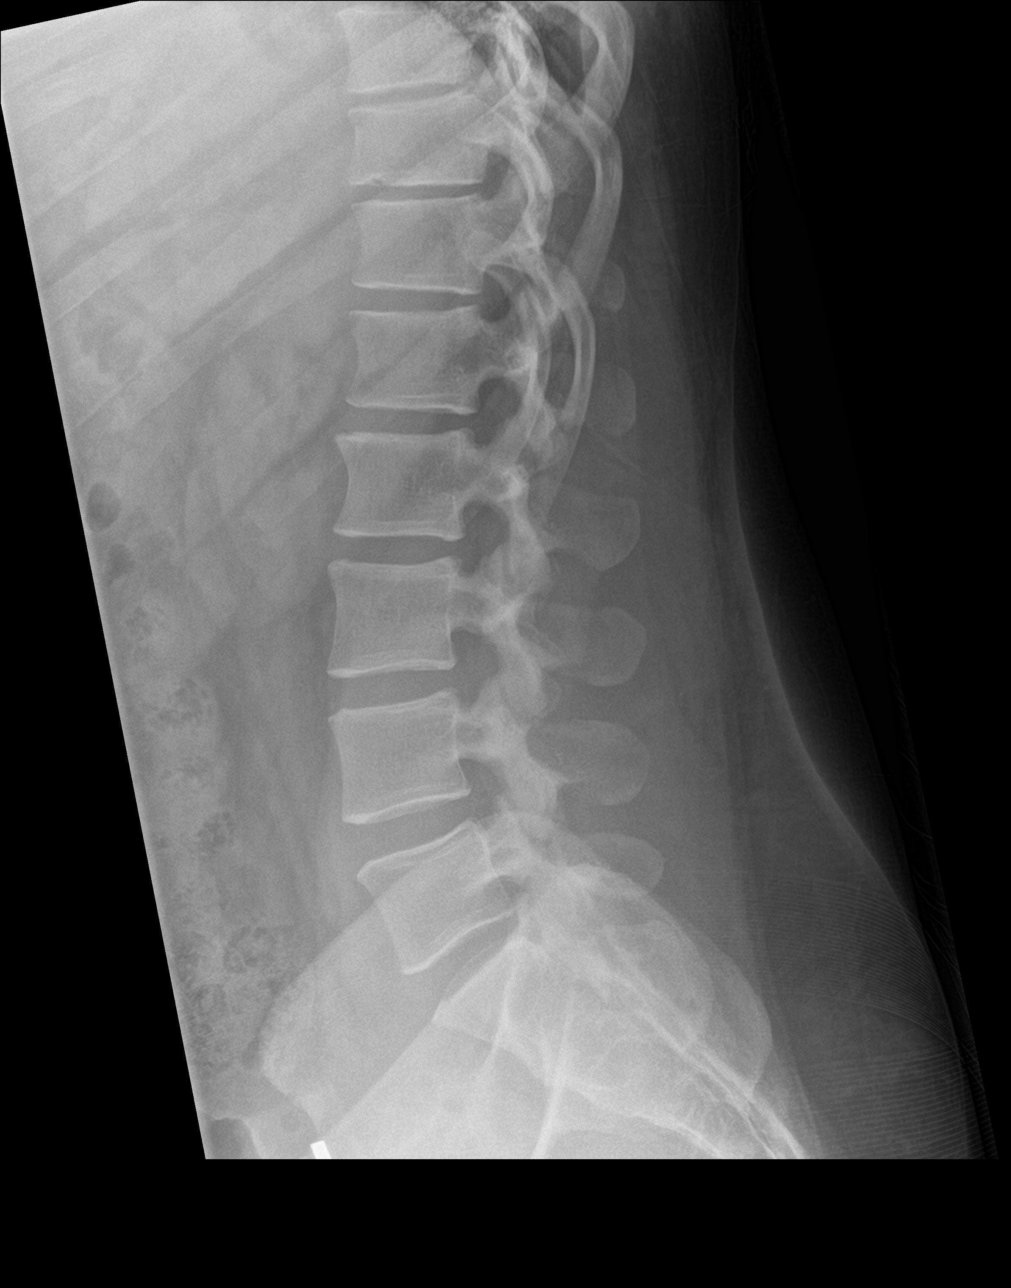

[l-spine spot]
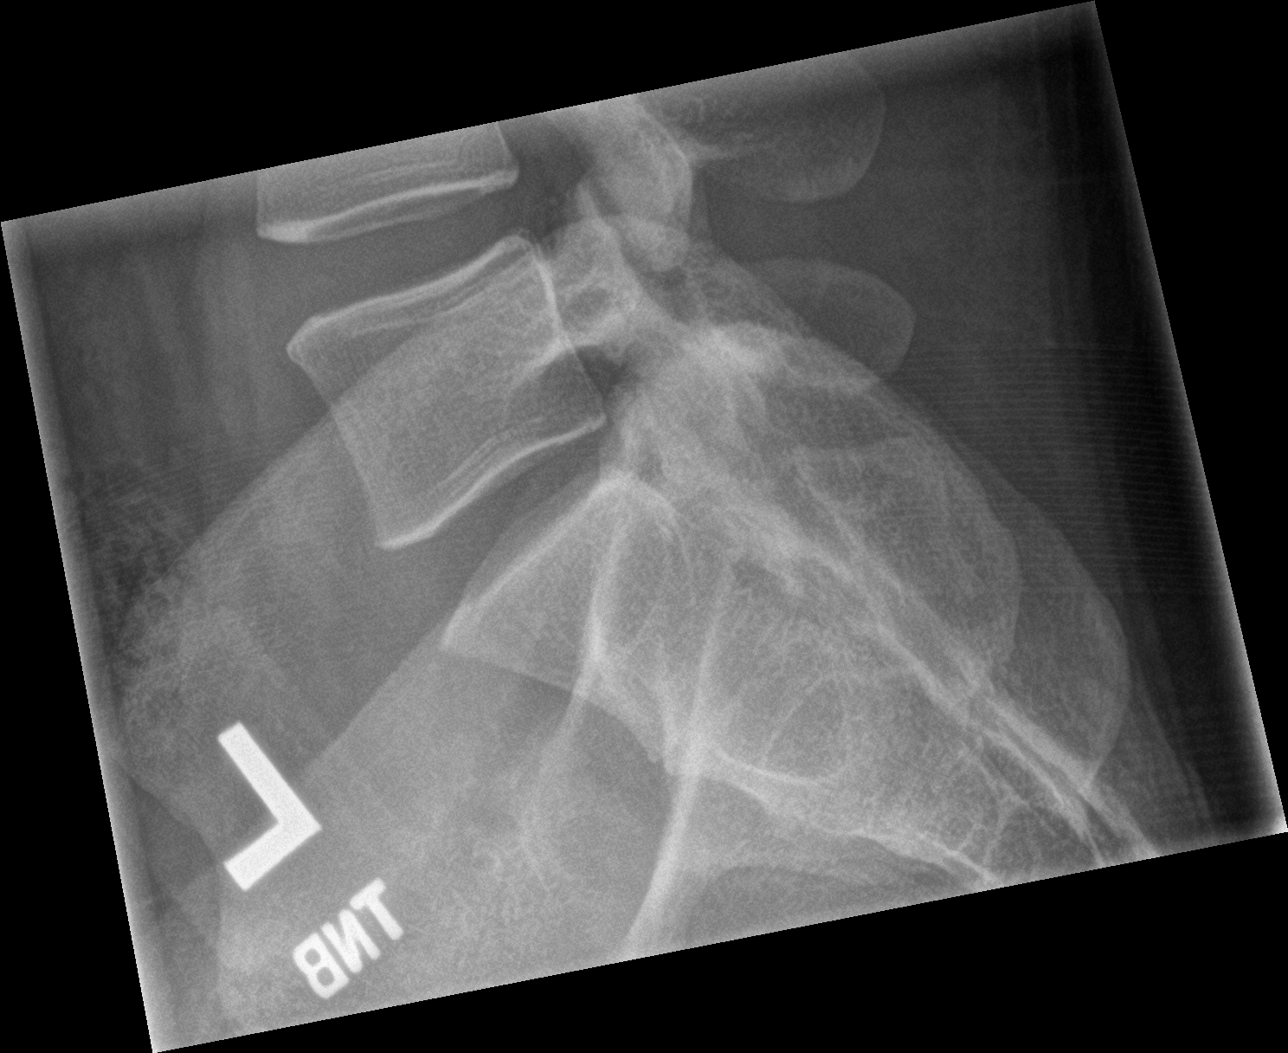

[5 of 5 positions shown; findings below may reference images not displayed]

FINDINGS: There is no evidence of lumbar spine fracture. Alignment is normal.
Intervertebral disc spaces are maintained.
IMPRESSION: Negative.

## 2019-12-28 ENCOUNTER — Encounter: Payer: Self-pay | Admitting: Adult Health

## 2019-12-28 ENCOUNTER — Other Ambulatory Visit: Payer: Self-pay

## 2019-12-28 ENCOUNTER — Ambulatory Visit: Payer: Medicaid Other | Admitting: Adult Health

## 2019-12-28 VITALS — BP 132/77 | HR 74 | Temp 97.4°F | Ht 66.0 in | Wt 197.0 lb

## 2019-12-28 DIAGNOSIS — Z9989 Dependence on other enabling machines and devices: Secondary | ICD-10-CM

## 2019-12-28 DIAGNOSIS — G4733 Obstructive sleep apnea (adult) (pediatric): Secondary | ICD-10-CM

## 2019-12-28 NOTE — Patient Instructions (Signed)
Continue using CPAP nightly and greater than 4 hours each night °If your symptoms worsen or you develop new symptoms please let us know.  ° °

## 2019-12-28 NOTE — Progress Notes (Signed)
PATIENT: [name] DOB: 01-30-1991  REASON FOR VISIT: follow up HISTORY FROM: patient  HISTORY OF PRESENT ILLNESS: Today 12/28/19:  [name] is a 29 year old male with a history of obstructive sleep apnea on CPAP.  His download indicates that he uses machine 25 out of 30 days for compliance of 83%.  He used his machine greater than 4 hours each night.  On average he uses his machine 6 hours and 54 minutes.  His residual AHI is 2 on 6-15 centimeters of water with EPR 2.  Leak in the 95th percentile is 25 L/min.  Reports CPAP is working well for him.  He returns today for an evaluation  HISTORY 12/23/18:  [name] is a 29 year old male with a history of obstructive sleep apnea on CPAP.  We completed a virtual visit for follow-up.  His CPAP download indicates that he uses machine 27 out of 30 days for compliance of 90%.  He uses machine greater than 4 hours each night.  On average he uses his machine 7 hours and 10 minutes.  His residual AHI is 1.3 on 6-15 cmH2O with EPR 3.  He does not have a significant leak.  He states that since we changed his pressure he has noted an improvement in his daytime sleepiness.  He still has some issues with sleepiness but it is improved.  He was working at [employer] but due to COVID-19 he is no longer employed.  He denies any new symptoms.  He returns today for evaluation.  REVIEW OF SYSTEMS: Out of a complete 14 system review of symptoms, the patient complains only of the following symptoms, and all other reviewed systems are negative.  FSS 40 ESS 9  ALLERGIES: Allergies  Allergen Reactions  . Strattera [Atomoxetine Hcl] Other (See Comments)    CAUSED AGGRESSION  . Sulfa Antibiotics Hives    HOME MEDICATIONS: Outpatient Medications Prior to Visit  Medication Sig Dispense Refill  . albuterol (VENTOLIN HFA) 108 (90 Base) MCG/ACT inhaler Inhale 2 puffs into the lungs every 6 (six) hours as needed for wheezing or shortness of breath.  18 g 2  . diclofenac (VOLTAREN) 75 MG EC tablet 1 twice daily PRN 30 tablet 2  . ibuprofen (ADVIL,MOTRIN) 600 MG tablet Take 1 tablet (600 mg total) by mouth every 8 (eight) hours as needed for headache or moderate pain. 45 tablet 1  . loratadine (CLARITIN) 10 MG tablet Take 1 tablet (10 mg total) by mouth daily. 30 tablet 5  . Phenylephrine-DM-GG-APAP (TYLENOL COLD/FLU SEVERE PO) Take by mouth. Takes prn (equate brand)    . hydrocortisone 2.5 % cream Apply twice a day to rash for 3 days 15 g 0  . ketoconazole (NIZORAL) 2 % cream Apply twice a day to rash for 10 days 15 g 0   No facility-administered medications prior to visit.    PAST MEDICAL HISTORY: Past Medical History:  Diagnosis Date  . ADHD (attention deficit hyperactivity disorder)    no current med.  . Asthma    prn inhaler  . Depression   . Migraines   . 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 OCD (obsessive compulsive disorder)   . Oppositional defiant disorder   . PTSD (post-traumatic stress disorder)   . Stuffy nose 07/01/2017    PAST SURGICAL HISTORY: Past Surgical History:  Procedure Laterality Date  . GANGLION CYST EXCISION Right 07/03/2017   Procedure: RIGHT HAND NEUROMA REMOVAL;  Surgeon: Leandrew Koyanagi, MD;  Location: [facility];  Service: Orthopedics;  Laterality: Right;  . neuroma of hand removed    . TENDON LENGTHENING Bilateral    as a child  . TOOTH EXTRACTION      FAMILY HISTORY: Family History  Problem Relation Age of Onset  . Bipolar disorder Cousin   . Alcohol abuse Paternal Aunt   . Drug abuse Paternal Aunt   . Diabetes Mother   . Heart attack Father   . Cancer Father     SOCIAL HISTORY: Social History   Socioeconomic History  . Marital status: Single    Spouse name: Not on file  . Number of children: Not on file  . Years of education: Not on file  . Highest education level: Not on file  Occupational History  . Not on file  Tobacco Use  . Smoking status: Former Smoker     Years: 0.00    Quit date: 06/19/2013    Years since quitting: 6.5  . Smokeless tobacco: Former Systems developer    Types: [place] date: 02/12/2016  Substance and Sexual Activity  . Alcohol use: Yes    Comment: occasional  . Drug use: No  . Sexual activity: Yes    Partners: Female  Other Topics Concern  . Not on file  Social History Narrative  . Not on file   Social Determinants of Health   Financial Resource Strain:   . Difficulty of Paying Living Expenses:   Food Insecurity:   . Worried About Charity fundraiser in the Last Year:   . Arboriculturist in the Last Year:   Transportation Needs:   . Film/video editor (Medical):   Marland Kitchen Lack of Transportation (Non-Medical):   Physical Activity:   . Days of Exercise per Week:   . Minutes of Exercise per Session:   Stress:   . Feeling of Stress :   Social Connections:   . Frequency of Communication with Friends and Family:   . Frequency of Social Gatherings with Friends and Family:   . Attends Religious Services:   . Active Member of Clubs or Organizations:   . Attends Archivist Meetings:   Marland Kitchen Marital Status:   Intimate Partner Violence:   . Fear of Current or Ex-Partner:   . Emotionally Abused:   Marland Kitchen Physically Abused:   . Sexually Abused:       PHYSICAL EXAM  Vitals:   12/28/19 1038  BP: 132/77  Pulse: 74  Temp: (!) 97.4 F (36.3 C)  Weight: 197 lb (89.4 kg)  Height: 5\' 6"  (1.676 m)   Body mass index is 31.8 kg/m.  Generalized: Well developed, in no acute distress  Chest: Lungs clear to auscultation bilaterally  Neurological examination  Mentation: Alert oriented to time, place, history taking. Follows all commands speech and language fluent Cranial nerve II-XII: Extraocular movements were full, visual field were full on confrontational test Head turning and shoulder shrug  were normal and symmetric. Motor: The motor testing reveals 5 over 5 strength of all 4 extremities. Good symmetric motor tone  is noted throughout.  Sensory: Sensory testing is intact to soft touch on all 4 extremities. No evidence of extinction is noted.  Gait and station: Gait is normal.    DIAGNOSTIC DATA (LABS, IMAGING, TESTING) - I reviewed patient records, labs, notes, testing and imaging myself where available.  Lab Results  Component Value Date   WBC 12.3 (H) 07/05/2017   HGB 14.8  07/05/2017   HCT 44.1 07/05/2017   MCV 85.5 07/05/2017   PLT 256 07/05/2017      Component Value Date/Time   NA 144 01/31/2018 1149   K 5.1 01/31/2018 1149   CL 105 01/31/2018 1149   CO2 21 01/31/2018 1149   GLUCOSE 91 01/31/2018 1149   GLUCOSE 124 (H) 07/05/2017 1417   BUN 11 01/31/2018 1149   CREATININE 0.74 (L) 01/31/2018 1149   CREATININE 0.66 09/22/2013 1050   CALCIUM 9.7 01/31/2018 1149   PROT 7.8 01/31/2018 1149   ALBUMIN 4.6 01/31/2018 1149   AST 16 01/31/2018 1149   ALT 28 01/31/2018 1149   ALKPHOS 66 01/31/2018 1149   BILITOT 0.3 01/31/2018 1149   GFRNONAA 128 01/31/2018 1149   GFRAA 148 01/31/2018 1149   Lab Results  Component Value Date   CHOL 220 (H) 01/31/2018   HDL 39 (L) 01/31/2018   LDLCALC 144 (H) 01/31/2018   TRIG 183 (H) 01/31/2018   CHOLHDL 5.6 (H) 01/31/2018   Lab Results  Component Value Date   HGBA1C 5.3 01/31/2018   No results found for: [id number] Lab Results  Component Value Date   TSH 0.774 08/01/2017      ASSESSMENT AND PLAN 29 y.o. year old male  has a past medical history of ADHD (attention deficit hyperactivity disorder), Asthma, Depression, Migraines, Neuroma of hand (06/2017), OCD (obsessive compulsive disorder), Oppositional defiant disorder, PTSD (post-traumatic stress disorder), and Stuffy nose (07/01/2017). here with:  1. OSA on CPAP  - CPAP compliance excellent - Good treatment of AHI  - Encourage patient to use CPAP nightly and > 4 hours each night - F/U in 1 year or sooner if needed   I spent 25 minutes of face-to-face and non-face-to-face time with  patient.  This included previsit chart review, lab review, study review, order entry, electronic health record documentation, patient education.  Ward Givens, MSN, NP-C 12/28/2019, 10:56 AM [facility] [address] [place], [place] [address] [phone]

## 2020-03-09 ENCOUNTER — Telehealth (INDEPENDENT_AMBULATORY_CARE_PROVIDER_SITE_OTHER): Payer: Medicaid Other | Admitting: Psychiatry

## 2020-03-09 ENCOUNTER — Other Ambulatory Visit: Payer: Self-pay

## 2020-03-09 ENCOUNTER — Encounter (HOSPITAL_COMMUNITY): Payer: Self-pay | Admitting: Psychiatry

## 2020-03-09 DIAGNOSIS — F39 Unspecified mood [affective] disorder: Secondary | ICD-10-CM | POA: Diagnosis not present

## 2020-03-09 MED ORDER — BUPROPION HCL ER (XL) 150 MG PO TB24: 150.0000 mg | ORAL_TABLET | ORAL | 2 refills | Status: DC
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
## 2020-03-22 ENCOUNTER — Telehealth: Payer: Self-pay | Admitting: Family Medicine

## 2020-03-22 DIAGNOSIS — F419 Anxiety disorder, unspecified: Secondary | ICD-10-CM

## 2020-03-22 DIAGNOSIS — F439 Reaction to severe stress, unspecified: Secondary | ICD-10-CM

## 2020-03-22 NOTE — Telephone Encounter (Signed)
Referral ordered in Epic. Patient notified. 

## 2020-03-22 NOTE — Telephone Encounter (Signed)
Please go ahead for behavioral counseling referral I am not certain if Dr. Christian Mate still does behavioral counseling or at home he does specific psychologic work

## 2020-03-22 NOTE — Telephone Encounter (Signed)
Pt would like a referral to Dr. Sima Matas for therapy.

## 2020-03-22 NOTE — Telephone Encounter (Signed)
Stress, anxiety

## 2020-03-30 ENCOUNTER — Encounter: Payer: Self-pay | Admitting: Psychology

## 2020-03-30 ENCOUNTER — Encounter: Payer: Self-pay | Admitting: Family Medicine

## 2020-04-14 ENCOUNTER — Other Ambulatory Visit: Payer: Self-pay

## 2020-04-14 ENCOUNTER — Encounter: Payer: Self-pay | Admitting: Psychology

## 2020-04-14 ENCOUNTER — Encounter: Payer: Medicaid Other | Attending: Psychology | Admitting: Psychology

## 2020-04-14 DIAGNOSIS — F39 Unspecified mood [affective] disorder: Secondary | ICD-10-CM | POA: Diagnosis not present

## 2020-04-14 DIAGNOSIS — F422 Mixed obsessional thoughts and acts: Secondary | ICD-10-CM | POA: Insufficient documentation
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
## 2020-04-19 ENCOUNTER — Other Ambulatory Visit: Payer: Self-pay

## 2020-04-19 ENCOUNTER — Encounter (HOSPITAL_COMMUNITY): Payer: Self-pay | Admitting: Psychiatry

## 2020-04-19 ENCOUNTER — Telehealth (INDEPENDENT_AMBULATORY_CARE_PROVIDER_SITE_OTHER): Payer: Medicaid Other | Admitting: Psychiatry

## 2020-04-19 DIAGNOSIS — F39 Unspecified mood [affective] disorder: Secondary | ICD-10-CM | POA: Diagnosis not present

## 2020-04-19 MED ORDER — BUPROPION HCL ER (XL) 150 MG PO TB24: 150.0000 mg | ORAL_TABLET | ORAL | 2 refills | Status: DC
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
## 2020-04-22 ENCOUNTER — Ambulatory Visit (INDEPENDENT_AMBULATORY_CARE_PROVIDER_SITE_OTHER): Payer: Medicaid Other | Admitting: Family Medicine

## 2020-04-22 ENCOUNTER — Encounter: Payer: Self-pay | Admitting: Family Medicine

## 2020-04-22 ENCOUNTER — Other Ambulatory Visit: Payer: Self-pay

## 2020-04-22 VITALS — HR 85 | Temp 98.6°F | Resp 16

## 2020-04-22 DIAGNOSIS — A084 Viral intestinal infection, unspecified: Secondary | ICD-10-CM

## 2020-04-22 DIAGNOSIS — K2900 Acute gastritis without bleeding: Secondary | ICD-10-CM | POA: Insufficient documentation

## 2020-04-22 NOTE — Patient Instructions (Signed)
Your illness is likely caused by a virus. I recommend supportive therapy to help you to feel better.  1) Get lots of rest.  2) Take over the counter pain medication if needed, such as acetaminophen or ibuprofen. Read and follow instructions on the label and make sure not to combine other medications that may have same ingredients in it. It is important to not take too much of these ingredients.  3) Drink plenty of caffeine-free fluids. (If you have heart or kidney problems, follow the instructions of your specialist regarding amounts).  4) If you are hungry, eat a bland diet, such as the BRAT diet (bananas, rice, applesauce, toast).  5) Let us know if you are not feeling better in a week.    Bland Diet A bland diet consists of foods that are often soft and do not have a lot of fat, fiber, or extra seasonings. Foods without fat, fiber, or seasoning are easier for the body to digest. They are also less likely to irritate your mouth, throat, stomach, and other parts of your digestive system. A bland diet is sometimes called a BRAT diet. What is my plan? Your health care provider or food and nutrition specialist (dietitian) may recommend specific changes to your diet to prevent symptoms or to treat your symptoms. These changes may include:  Eating small meals often.  Cooking food until it is soft enough to chew easily.  Chewing your food well.  Drinking fluids slowly.  Not eating foods that are very spicy, sour, or fatty.  Not eating citrus fruits, such as oranges and grapefruit. What do I need to know about this diet?  Eat a variety of foods from the bland diet food list.  Do not follow a bland diet longer than needed.  Ask your health care provider whether you should take vitamins or supplements. What foods can I eat? Grains  Hot cereals, such as cream of wheat. Rice. Bread, crackers, or tortillas made from refined white flour. Vegetables Canned or cooked vegetables. Mashed or  boiled potatoes. Fruits  Bananas. Applesauce. Other types of cooked or canned fruit with the skin and seeds removed, such as canned peaches or pears. Meats and other proteins  Scrambled eggs. Creamy peanut butter or other nut butters. Lean, well-cooked meats, such as chicken or fish. Tofu. Soups or broths. Dairy Low-fat dairy products, such as milk, cottage cheese, or yogurt. Beverages  Water. Herbal tea. Apple juice. Fats and oils Mild salad dressings. Canola or olive oil. Sweets and desserts Pudding. Custard. Fruit gelatin. Ice cream. The items listed above may not be a complete list of recommended foods and beverages. Contact a dietitian for more options. What foods are not recommended? Grains Whole grain breads and cereals. Vegetables Raw vegetables. Fruits Raw fruits, especially citrus, berries, or dried fruits. Dairy Whole fat dairy foods. Beverages Caffeinated drinks. Alcohol. Seasonings and condiments Strongly flavored seasonings or condiments. Hot sauce. Salsa. Other foods Spicy foods. Fried foods. Sour foods, such as pickled or fermented foods. Foods with high sugar content. Foods high in fiber. The items listed above may not be a complete list of foods and beverages to avoid. Contact a dietitian for more information. Summary  A bland diet consists of foods that are often soft and do not have a lot of fat, fiber, or extra seasonings.  Foods without fat, fiber, or seasoning are easier for the body to digest.  Check with your health care provider to see how long you should follow this diet  plan. It is not meant to be followed for long periods. This information is not intended to replace advice given to you by your health care provider. Make sure you discuss any questions you have with your health care provider. Document Revised: 09/04/2017 Document Reviewed: 09/04/2017 Elsevier Patient Education  2020 [employer].

## 2020-04-22 NOTE — Progress Notes (Signed)
Patient ID: [name], male    DOB: Jan 04, 1991, 29 y.o.   MRN: [id number]   Chief Complaint  Patient presents with  . Abdominal Pain   Subjective:    HPI Pt having lower abdominal pain, nausea, cough, vomiting, and light headedness. Pt states he has "not felt right". Has been sent home from work. At home COVID test negative.     Medical History [name] has a past medical history of ADHD (attention deficit hyperactivity disorder), Asthma, Depression, Migraines, Neuroma of hand (06/2017), OCD (obsessive compulsive disorder), Oppositional defiant disorder, PTSD (post-traumatic stress disorder), and Stuffy nose (07/01/2017).   Outpatient Encounter Medications as of 04/22/2020  Medication Sig  . albuterol (VENTOLIN HFA) 108 (90 Base) MCG/ACT inhaler Inhale 2 puffs into the lungs every 6 (six) hours as needed for wheezing or shortness of breath.  Marland Kitchen buPROPion (WELLBUTRIN XL) 150 MG 24 hr tablet Take 1 tablet (150 mg total) by mouth every morning.  . diclofenac (VOLTAREN) 75 MG EC tablet 1 twice daily PRN  . ibuprofen (ADVIL,MOTRIN) 600 MG tablet Take 1 tablet (600 mg total) by mouth every 8 (eight) hours as needed for headache or moderate pain.  Marland Kitchen loratadine (CLARITIN) 10 MG tablet Take 1 tablet (10 mg total) by mouth daily.  Marland Kitchen Phenylephrine-DM-GG-APAP (TYLENOL COLD/FLU SEVERE PO) Take by mouth. Takes prn (equate brand)   No facility-administered encounter medications on file as of 04/22/2020.     Review of Systems  Constitutional: Positive for chills. Negative for fever.       Chills x 1 this morning  HENT: Negative.   Respiratory: Positive for cough. Negative for shortness of breath.   Cardiovascular: Negative for chest pain.  Gastrointestinal: Positive for abdominal pain, diarrhea, nausea and vomiting. Negative for constipation.       Hasn't felt well since Monday. Started vomiting yesterday, last time was 4:00 yesterday. Feels better today.   Genitourinary: Negative.     Musculoskeletal: Negative.   Skin: Negative.   Neurological: Positive for weakness and light-headedness.       Been drinking fluids and was able to drive to appointment today without difficulty.   Hematological: Negative for adenopathy.  Psychiatric/Behavioral: Negative.      Vitals Pulse 85   Temp 98.6 F (37 C)   Resp 16   SpO2 98%   Objective:   Physical Exam Vitals and nursing note reviewed.  Constitutional:      Appearance: He is well-developed. He is not ill-appearing or toxic-appearing.  HENT:     Right Ear: Tympanic membrane normal.     Left Ear: Tympanic membrane normal.  Cardiovascular:     Heart sounds: Normal heart sounds.  Pulmonary:     Effort: Pulmonary effort is normal.     Breath sounds: Normal breath sounds.  Abdominal:     General: Bowel sounds are normal.     Palpations: Abdomen is soft.     Tenderness: There is generalized abdominal tenderness. There is no rebound. Negative signs include Rovsing's sign.  Skin:    General: Skin is warm and dry.  Neurological:     Mental Status: He is alert and oriented to person, place, and time.  Psychiatric:        Mood and Affect: Mood normal.        Behavior: Behavior normal.      Assessment and Plan   1. Viral gastroenteritis   Started having nausea and vomiting yesterday. Better today. No fever. Generalized abdominal pain without rebound  or guarding. Feels he is getting better and wants to return to work on Monday. Note given. He will follow a bland diet and stay hydrated over the weekend.  Agrees with plan of care discussed today. Understands warning signs to seek further care: if his symptoms do not continue to improve.  Understands to follow-up if symptoms do not improve or if anything changes.     Chalmers Guest, NP 04/22/2020

## 2020-04-24 ENCOUNTER — Encounter: Payer: Self-pay | Admitting: Family Medicine

## 2020-06-05 ENCOUNTER — Encounter: Payer: Self-pay | Admitting: Family Medicine

## 2020-06-06 ENCOUNTER — Telehealth: Payer: Self-pay

## 2020-06-06 NOTE — Telephone Encounter (Signed)
FYI-Patient tested positive for Covid

## 2020-06-14 ENCOUNTER — Telehealth (INDEPENDENT_AMBULATORY_CARE_PROVIDER_SITE_OTHER): Payer: Medicaid Other | Admitting: Family Medicine

## 2020-06-14 ENCOUNTER — Other Ambulatory Visit: Payer: Self-pay

## 2020-06-14 DIAGNOSIS — J019 Acute sinusitis, unspecified: Secondary | ICD-10-CM

## 2020-06-14 DIAGNOSIS — U071 COVID-19: Secondary | ICD-10-CM | POA: Diagnosis not present

## 2020-06-14 MED ORDER — AZITHROMYCIN 250 MG PO TABS: ORAL_TABLET | ORAL | 0 refills | Status: DC

## 2020-06-14 MED ORDER — PREDNISONE 20 MG PO TABS: ORAL_TABLET | ORAL | 0 refills | Status: DC

## 2020-06-14 MED ORDER — ALBUTEROL SULFATE HFA 108 (90 BASE) MCG/ACT IN AERS: 2.0000 | INHALATION_SPRAY | RESPIRATORY_TRACT | 2 refills | Status: DC | PRN

## 2020-06-14 NOTE — Progress Notes (Signed)
   Subjective:    Patient ID: [name], male    DOB: 06-29-1991, 29 y.o.   MRN: [id number]  Covid positive. Positive at home test 04-Dec-2022. Patient complains of coughing, congestion, trouble breathing and chest pain. Weak, fatigued, no sense of smell.  States his mother passed away 12-04-22 from covid.   Patient was brought to the office Initially this was a virtual visit but impossible to assess him virtually He has been having body aches headache congestion coughing started to get better for a few days now getting worse it has been 10 days since onset of his symptoms.  He does not have any major underlying health issues except for reactive airway   Review of Systems See above    Objective:   Physical Exam Tight cough is noted but patient is able to move air in and out without crackles no significant wheezing no shortness of breath on physical exam but patient does feel short winded when he tries to do stuff O2 saturation 95% not in respiratory distress       Assessment & Plan:  Warning signs on when to go to the ER was discussed in detail Albuterol 2 puffs every 2-4 hours as needed We will go ahead with prednisone and antibiotics to cover for bacterial secondary infection Hold off on x-rays lab work currently If progressive symptoms or worse over the next 48 hours may need to go to the ER

## 2020-06-16 ENCOUNTER — Other Ambulatory Visit: Payer: Medicaid Other

## 2020-06-20 ENCOUNTER — Telehealth: Payer: Self-pay

## 2020-06-20 ENCOUNTER — Encounter: Payer: Self-pay | Admitting: Family Medicine

## 2020-06-20 NOTE — Telephone Encounter (Signed)
Letter was dictated please forward to the patient

## 2020-06-20 NOTE — Telephone Encounter (Signed)
Patient states he tested positive for Covid 06/05/2020

## 2020-06-20 NOTE — Telephone Encounter (Signed)
The patient recently tested positive for Covid I believe.  What day did he test positive?  What day then his symptoms first began.  If it is been 10 days or more which I believe that has-then we can do a letter and he can pick it up tomorrow Please find out send me the message back thank you

## 2020-06-20 NOTE — Telephone Encounter (Signed)
Patient is feeling better just a little hoarse and still has a cough but needs a letter stating he is cleared to go to a class on Thursday that he is taking. Please advise

## 2020-06-23 ENCOUNTER — Encounter: Payer: Medicaid Other | Admitting: Psychology

## 2020-06-23 ENCOUNTER — Other Ambulatory Visit: Payer: Self-pay

## 2020-06-23 ENCOUNTER — Encounter: Payer: Self-pay | Admitting: Psychology

## 2020-06-23 ENCOUNTER — Encounter: Payer: Medicaid Other | Attending: Psychology | Admitting: Psychology

## 2020-06-23 ENCOUNTER — Ambulatory Visit: Payer: Self-pay | Admitting: Psychology

## 2020-06-23 DIAGNOSIS — Z9989 Dependence on other enabling machines and devices: Secondary | ICD-10-CM | POA: Diagnosis not present

## 2020-06-23 DIAGNOSIS — F39 Unspecified mood [affective] disorder: Secondary | ICD-10-CM | POA: Insufficient documentation

## 2020-06-23 DIAGNOSIS — G4733 Obstructive sleep apnea (adult) (pediatric): Secondary | ICD-10-CM | POA: Diagnosis not present

## 2020-06-23 DIAGNOSIS — F422 Mixed obsessional thoughts and acts: Secondary | ICD-10-CM | POA: Insufficient documentation
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
## 2020-07-07 ENCOUNTER — Other Ambulatory Visit (INDEPENDENT_AMBULATORY_CARE_PROVIDER_SITE_OTHER): Payer: Medicaid Other | Admitting: *Deleted

## 2020-07-07 ENCOUNTER — Other Ambulatory Visit: Payer: Self-pay

## 2020-07-07 DIAGNOSIS — Z23 Encounter for immunization: Secondary | ICD-10-CM | POA: Diagnosis not present

## 2020-07-18 ENCOUNTER — Other Ambulatory Visit: Payer: Self-pay

## 2020-07-18 ENCOUNTER — Encounter: Payer: Medicaid Other | Admitting: Psychology

## 2020-07-18 DIAGNOSIS — F422 Mixed obsessional thoughts and acts: Secondary | ICD-10-CM

## 2020-07-18 DIAGNOSIS — G4733 Obstructive sleep apnea (adult) (pediatric): Secondary | ICD-10-CM | POA: Diagnosis not present

## 2020-07-18 DIAGNOSIS — Z9989 Dependence on other enabling machines and devices: Secondary | ICD-10-CM | POA: Diagnosis not present

## 2020-07-18 DIAGNOSIS — F39 Unspecified mood [affective] disorder: Secondary | ICD-10-CM

## 2020-07-19 ENCOUNTER — Encounter: Payer: Self-pay | Admitting: Psychology
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
## 2020-08-01 ENCOUNTER — Encounter: Payer: Medicaid Other | Attending: Psychology | Admitting: Psychology

## 2020-08-01 ENCOUNTER — Other Ambulatory Visit: Payer: Self-pay

## 2020-08-01 DIAGNOSIS — F422 Mixed obsessional thoughts and acts: Secondary | ICD-10-CM | POA: Insufficient documentation

## 2020-08-01 DIAGNOSIS — F39 Unspecified mood [affective] disorder: Secondary | ICD-10-CM | POA: Diagnosis not present

## 2020-08-02 ENCOUNTER — Encounter: Payer: Self-pay | Admitting: Psychology
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
## 2020-08-10 ENCOUNTER — Telehealth: Payer: Self-pay

## 2020-08-10 NOTE — Telephone Encounter (Signed)
Absalom thinks he has food poison he eat some [place] bacon that tasted sour and after that he started having diarrhea since Sunday and can't get it to stop. He wanting advise what to do.   Pt call back [phone]

## 2020-08-10 NOTE — Telephone Encounter (Signed)
Imodium OTC tablets 1 or 2 tablets every 6 hours as needed for diarrhea, continue to eat a bland diet avoid fried foods may have liquids

## 2020-08-10 NOTE — Telephone Encounter (Signed)
Patient notified of recommendations. 

## 2020-08-22 ENCOUNTER — Encounter: Payer: Medicaid Other | Attending: Psychology | Admitting: Psychology

## 2020-08-22 ENCOUNTER — Encounter: Payer: Medicaid Other | Admitting: Psychology

## 2020-08-22 DIAGNOSIS — F422 Mixed obsessional thoughts and acts: Secondary | ICD-10-CM | POA: Insufficient documentation

## 2020-08-22 DIAGNOSIS — F39 Unspecified mood [affective] disorder: Secondary | ICD-10-CM | POA: Diagnosis not present
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
## 2020-08-27 ENCOUNTER — Emergency Department (HOSPITAL_COMMUNITY)
Admission: EM | Admit: 2020-08-27 | Discharge: 2020-08-27 | Disposition: A | Discharge status: Home / Self Care | Payer: Medicaid Other | Attending: Emergency Medicine | Admitting: Emergency Medicine

## 2020-08-27 ENCOUNTER — Encounter (HOSPITAL_COMMUNITY): Payer: Self-pay | Admitting: Emergency Medicine

## 2020-08-27 ENCOUNTER — Other Ambulatory Visit: Payer: Self-pay

## 2020-08-27 DIAGNOSIS — R6883 Chills (without fever): Secondary | ICD-10-CM | POA: Diagnosis not present

## 2020-08-27 DIAGNOSIS — R253 Fasciculation: Secondary | ICD-10-CM | POA: Insufficient documentation

## 2020-08-27 DIAGNOSIS — Z5321 Procedure and treatment not carried out due to patient leaving prior to being seen by health care provider: Secondary | ICD-10-CM | POA: Insufficient documentation

## 2020-08-27 DIAGNOSIS — M791 Myalgia, unspecified site: Secondary | ICD-10-CM | POA: Diagnosis not present

## 2020-08-27 DIAGNOSIS — R200 Anesthesia of skin: Secondary | ICD-10-CM | POA: Diagnosis not present

## 2020-08-27 HISTORY — DX: Unspecified convulsions: R56.9

## 2020-08-27 NOTE — ED Triage Notes (Signed)
Patient c/o possible reaction to second Covid vaccination yesterday at [facility]. Patient states he was at a out today when he started having chills, numbness, and pain in all extremities and jerking motion. Per patient went to get inhaler from car and for approx 2 minutes he doesn't recall what happened after that. Patient does report hx of seizures. Per patient happened latter today while laying on the couch.

## 2020-09-26 ENCOUNTER — Encounter: Payer: Self-pay | Admitting: Family Medicine

## 2020-09-26 ENCOUNTER — Ambulatory Visit: Payer: Medicaid Other | Admitting: Family Medicine

## 2020-09-26 ENCOUNTER — Other Ambulatory Visit: Payer: Self-pay

## 2020-09-26 VITALS — BP 142/90 | HR 83 | Temp 97.9°F | Ht 66.0 in | Wt 197.0 lb

## 2020-09-26 DIAGNOSIS — R0981 Nasal congestion: Secondary | ICD-10-CM | POA: Diagnosis not present

## 2020-09-26 DIAGNOSIS — M778 Other enthesopathies, not elsewhere classified: Secondary | ICD-10-CM

## 2020-09-26 MED ORDER — NAPROXEN 500 MG PO TABS: 500.0000 mg | ORAL_TABLET | Freq: Two times a day (BID) | ORAL | 0 refills | Status: DC

## 2020-09-26 NOTE — Patient Instructions (Signed)
  Shoulder extension  11/28/2018 Document Reviewed: 09/05/2018 Elsevier Patient Education  [place].

## 2020-09-26 NOTE — Progress Notes (Signed)
   Subjective:    Patient ID: [name], male    DOB: 1991/07/13, 30 y.o.   MRN: [id number]  HPIleft shoulder pain for awhile but getting worse.  Pain wakes from sleep.  Pain going up into neck   Relates shoulder pain discomfort.  Hurts with certain movements.  Denies any injury.  Has tried some stretches without much success. Complains of frequent nasal stuffiness sinus pressure and discomfort cannot breathe through his nose has tried OTC nasal sprays without much success patient is requesting visit with ENT for further evaluation Review of Systems    Please see above Objective:   Physical Exam Tenderness in the left shoulder with decreased range of motion no obvious rotator cuff tear Lungs clear heart regular HEENT benign      Assessment & Plan:  1. Left shoulder tendinitis Referral to orthopedics, exercises shown, try Naprosyn twice daily over the next 2 weeks, follow-up if progressive troubles  - Ambulatory referral to Orthopedic Surgery  2. Chronic nasal congestion Nasal congestion more than likely this is just stuffiness I would recommend Flonase but since the patient desires to have his adenoids checked we will set him up with ENT he states he was told at one time he needed nasal surgery - Ambulatory referral to ENT

## 2020-10-25 ENCOUNTER — Ambulatory Visit (INDEPENDENT_AMBULATORY_CARE_PROVIDER_SITE_OTHER): Payer: Medicaid Other | Admitting: Orthopedic Surgery

## 2020-10-25 ENCOUNTER — Encounter: Payer: Self-pay | Admitting: Orthopedic Surgery

## 2020-10-25 ENCOUNTER — Ambulatory Visit: Payer: Medicaid Other

## 2020-10-25 ENCOUNTER — Other Ambulatory Visit: Payer: Self-pay

## 2020-10-25 VITALS — BP 150/92 | HR 77 | Ht 66.0 in | Wt 191.0 lb

## 2020-10-25 DIAGNOSIS — G8929 Other chronic pain: Secondary | ICD-10-CM | POA: Diagnosis not present

## 2020-10-25 DIAGNOSIS — M25512 Pain in left shoulder: Secondary | ICD-10-CM

## 2020-10-25 NOTE — Progress Notes (Signed)
New Patient Visit  Assessment: [name] is a 30 y.o. male with the following: Left AC joint pain  Plan: Patient currently has pain, primarily isolated to the left shoulder AC joint.  He denies a specific injury, however he notes that he could have fallen at some point, which could have caused this injury.  Nonetheless, x-rays are without obvious injury.  He started working with a chiropractor recently, and his range of motion is significantly improved, as has his pain.  Based on my physical exam today, I do not feel as though he has a significant injury to the left shoulder rotator cuff.  I have advised him to continue working on his range of motion and strengthening, with medications as needed.  No surgical intervention is warranted at this time.  No further treatment recommendations.  If he continues to have issues, he can follow-up in clinic as needed.   Follow-up: Return if symptoms worsen or fail to improve.  Subjective:  Chief Complaint  Patient presents with  . Shoulder Pain    Lt shoulder for over a year NKI. 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 Still has soreness reaching straight up and across his chest.     History of Present Illness: [name] is a 30 y.o. male who has been referred to clinic today by Sallee Lange, MD for evaluation of left shoulder pain.  He notes he has had pain in his left shoulder for at least a year.  He denies a specific injury.  No falls that he can remember.  However, he did state it is possible that he fell, but he cannot remember.  He takes pain medications intermittently.  He recently started seeing a chiropractor, and the treatments, including some therapy is significantly improving his pain.  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  No numbness or tingling distally.  The skin overlying his clavicle and anterior shoulder has not changed in color.  No fevers or chills.  His  health has remained stable.   Review of Systems: No fevers or chills No numbness or tingling No chest pain No shortness of breath No bowel or bladder dysfunction No GI distress No headaches   Medical History:  Past Medical History:  Diagnosis Date  . ADHD (attention deficit hyperactivity disorder)    no current med.  . Asthma    prn inhaler  . Depression   . Migraines   . 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 OCD (obsessive compulsive disorder)   . Oppositional defiant disorder   . PTSD (post-traumatic stress disorder)   . Seizures ([place])   . Stuffy nose 07/01/2017    Past Surgical History:  Procedure Laterality Date  . GANGLION CYST EXCISION Right 07/03/2017   Procedure: RIGHT HAND NEUROMA REMOVAL;  Surgeon: Leandrew Koyanagi, MD;  Location: [place];  Service: Orthopedics;  Laterality: Right;  . neuroma of hand removed    . TENDON LENGTHENING Bilateral    as a child  . TOOTH EXTRACTION      Family History  Problem Relation Age of Onset  . Bipolar disorder Cousin   . Alcohol abuse Paternal Aunt   . Drug abuse Paternal Aunt   . Diabetes Mother   . Heart attack Father   . Cancer Father    Social History   Tobacco Use  . Smoking status: Current Every Day Smoker    Packs/day:  0.01    Types: Cigarettes  . Smokeless tobacco: Former Systems developer    Types: [place] date: 02/12/2016  Vaping Use  . Vaping Use: Never used  Substance Use Topics  . Alcohol use: Yes    Comment: occasional  . Drug use: No    Allergies  Allergen Reactions  . Strattera [Atomoxetine Hcl] Other (See Comments)    CAUSED AGGRESSION  . Sulfa Antibiotics Hives  . Guava Flavor     No outpatient medications have been marked as taking for the 10/25/20 encounter (Office Visit) with Mordecai Rasmussen, MD.    Objective: BP (!) 150/92   Pulse 77   Ht 5\' 6"  (1.676 m)   Wt 191 lb (86.6 kg)   BMI 30.83 kg/m   Physical Exam:  General: Alert and oriented, no acute  distress. Gait: Normal  Evaluation of the left shoulder demonstrates no obvious deformity.  Mild deformity is palpated at the [facility] joint.  This is tender to palpation.  He has full range of motion otherwise.  His strength is 5/5 throughout.  Pain is recreated with cross body adduction.  He also has some pain in the anterior shoulder with O'Brien's testing.  IMAGING: I personally ordered and reviewed the following images  X-rays of the left shoulder demonstrates no acute injury.  The glenohumeral joint is reduced.  Mild degenerative changes at the [facility] joint.  Impression: Normal-appearing left shoulder x-rays   New Medications:  No orders of the defined types were placed in this encounter.     Mordecai Rasmussen, MD  10/25/2020 11:18 PM

## 2020-11-02 ENCOUNTER — Encounter: Payer: Self-pay | Admitting: Orthopedic Surgery

## 2020-11-02 ENCOUNTER — Other Ambulatory Visit: Payer: Self-pay

## 2020-11-02 ENCOUNTER — Ambulatory Visit: Payer: Medicaid Other

## 2020-11-02 ENCOUNTER — Ambulatory Visit (INDEPENDENT_AMBULATORY_CARE_PROVIDER_SITE_OTHER): Payer: Medicaid Other | Admitting: Orthopedic Surgery

## 2020-11-02 VITALS — BP 152/96 | HR 85 | Ht 66.0 in | Wt 190.0 lb

## 2020-11-02 DIAGNOSIS — M545 Low back pain, unspecified: Secondary | ICD-10-CM | POA: Diagnosis not present

## 2020-11-03 ENCOUNTER — Encounter: Payer: Self-pay | Admitting: Orthopedic Surgery

## 2020-11-03 NOTE — Progress Notes (Signed)
Orthopaedic Clinic Return  Assessment: [name] is a 30 y.o. male with the following: Low back pain, without radiculopathy  Plan: We had extensive discussion in clinic today regarding his lower back pain.  He denies radiating pains into his legs, and notes that the pain is exclusively in his lower back.  Occasionally, the pain will extend in both directions into the paraspinal muscles, but nothing into his legs.  We reviewed radiographs which demonstrate no listhesis.  Minimal degenerative changes overall.  As a result, I have encouraged him to focus on general physical fitness, incorporating unit regular aerobic activity.  I have also suggested he work with a physical therapist.  I have provided him with a home exercise program for now.  He states his understanding.  He should also continue to avoid nicotine.  He can take medications as needed.  I stressed to him that the pain he is experiencing will not go away overnight, but does require commitment from him regarding his level of activity and appropriate exercise.  Follow-up as needed  Body mass index is 30.67 kg/m.  Follow-up: Return if symptoms worsen or fail to improve.   Subjective:  Chief Complaint  Patient presents with  . Back Pain    Patient reports lower back.    History of Present Illness: [name] is a 30 y.o. male who returns to clinic today for evaluation of his low back pain.  I recently saw him for left shoulder pain, which is remained consistent.  He states he has had lower back pain for several months.  The pain started in the middle of his lower back when he bent over to pick something up in the store.  His pain was sharp at the time.  He continues to have occasional sharp pains in the middle of his back.  No radiating pains into either leg.  No issues with his strength.  He has been working with a Restaurant manager, fast food recently, but has not been doing any specific exercises for his back.  He will occasionally  take medications for his back.  Review of Systems: No fevers or chills No numbness or tingling No chest pain No shortness of breath No bowel or bladder dysfunction No GI distress No headaches  Objective: BP (!) 152/96   Pulse 85   Ht 5\' 6"  (1.676 m)   Wt 190 lb (86.2 kg)   BMI 30.67 kg/m   Physical Exam:  Alert and oriented, no acute distress.  Normal gait  Evaluation of his lower back demonstrates no obvious deformity.  He has tenderness to palpation in the midline, with mild tenderness to palpation laterally in both directions.  Negative straight leg raise.  His strength is 5/5 in bilateral lower extremities.  Patellar and Achilles tendon reflexes are 2+ and symmetric bilaterally.  Full sensation throughout his bilateral lower extremities.  IMAGING: I personally ordered and reviewed the following images:  Standing x-rays of the lumbar spine were obtained in clinic today.  He has neutral overall alignment.  There is no evidence of listhesis.  There is no acute injuries noted.  Minimal degenerative changes noted throughout.  Well-maintained disc height.  Impression: Normal standing lumbar x-rays  Mordecai Rasmussen, MD 11/03/2020 9:08 AM

## 2020-12-05 ENCOUNTER — Encounter: Payer: Medicaid Other | Admitting: Psychology

## 2020-12-07 ENCOUNTER — Telehealth: Payer: Self-pay | Admitting: Adult Health

## 2020-12-07 NOTE — Telephone Encounter (Signed)
LVM for pt to call back to reschedule due to [name] being out.

## 2020-12-07 NOTE — Telephone Encounter (Signed)
Patient returned call and rescheduled his appt.

## 2020-12-15 ENCOUNTER — Other Ambulatory Visit: Payer: Self-pay

## 2020-12-15 ENCOUNTER — Encounter: Payer: Self-pay | Admitting: Psychology

## 2020-12-15 ENCOUNTER — Encounter: Payer: Medicaid Other | Attending: Psychology | Admitting: Psychology

## 2020-12-15 DIAGNOSIS — F422 Mixed obsessional thoughts and acts: Secondary | ICD-10-CM | POA: Diagnosis not present

## 2020-12-15 DIAGNOSIS — Z9989 Dependence on other enabling machines and devices: Secondary | ICD-10-CM | POA: Diagnosis not present

## 2020-12-15 DIAGNOSIS — F39 Unspecified mood [affective] disorder: Secondary | ICD-10-CM | POA: Diagnosis not present

## 2020-12-15 DIAGNOSIS — G4733 Obstructive sleep apnea (adult) (pediatric): Secondary | ICD-10-CM | POA: Diagnosis not present
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
## 2020-12-28 ENCOUNTER — Ambulatory Visit: Payer: Medicaid Other | Admitting: Adult Health

## 2021-01-24 ENCOUNTER — Other Ambulatory Visit: Payer: Self-pay

## 2021-01-24 ENCOUNTER — Ambulatory Visit: Payer: Medicaid Other | Admitting: Neurology

## 2021-01-24 ENCOUNTER — Encounter: Payer: Self-pay | Admitting: Neurology

## 2021-01-24 VITALS — BP 121/65 | HR 81 | Ht 66.0 in | Wt 193.0 lb

## 2021-01-24 DIAGNOSIS — G4733 Obstructive sleep apnea (adult) (pediatric): Secondary | ICD-10-CM

## 2021-01-24 DIAGNOSIS — Z9989 Dependence on other enabling machines and devices: Secondary | ICD-10-CM | POA: Diagnosis not present

## 2021-01-24 DIAGNOSIS — F39 Unspecified mood [affective] disorder: Secondary | ICD-10-CM | POA: Diagnosis not present

## 2021-01-24 DIAGNOSIS — F902 Attention-deficit hyperactivity disorder, combined type: Secondary | ICD-10-CM

## 2021-01-24 NOTE — Progress Notes (Signed)
PATIENT: [name] DOB: 06/11/1991  REASON FOR VISIT: follow up patient of Dr Brett Fairy.  HISTORY FROM: patient  HISTORY OF PRESENT ILLNESS:  01/24/21: Mr. [name] is a meanwhile 30 year old patient with known complex sleep apnea, seen on 01-24-2021:  Who has been a compliant CPAP user.  His CPAP download indicates a residual AHI of only 1.2 at 10th percentile 10 cm water pressure 95th percentile pressure and he has been using the machine 97 3% of the time.  This is an AutoSet between a minimum pressure of 6 maximum 15 cmH2O and an EPR level of 2 cmH2O.  So there needs to be no change in settings he does have some air leakage which he attributes to his older mask.  The patient showed me [employer] [facility] email that stated that his supply supplies had to be supplies had to be preauthorized through his insurance.   He is on Medicaid prepared patient healthy blue- [employer] and changed to [employer]AP[employer] his medicaire to healthy blue.   Needs new nasal pillows, filter, and tubing.     MM- 10-08-2018.[name] is a 30 year old male with a history of obstructive sleep apnea on CPAP.  His download indicates that he uses machine 25 out of 30 days for compliance of 83%.  He used his machine greater than 4 hours each night.  On average he uses his machine 6 hours and 54 minutes.  His residual AHI is 2 on 6-15 centimeters of water with EPR 2.  Leak in the 95th percentile is 25 L/min.  Reports CPAP is working well for him.  He returns today for an evaluation  HISTORY 12/23/18:  [name] is a 30 year old male with a history of obstructive sleep apnea on CPAP.  We completed a virtual visit for follow-up.  His CPAP download indicates that he uses machine 27 out of 30 days for compliance of 90%.  He uses machine greater than 4 hours each night.  On average he uses his machine 7 hours and 10 minutes.  His residual AHI is 1.3 on 6-15 cmH2O with EPR 3.  He does not have a significant leak.  He states that since we  changed his pressure he has noted an improvement in his daytime sleepiness.  He still has some issues with sleepiness but it is improved.  He was working at [employer] but due to COVID-19 he is no longer employed.  He denies any new symptoms.  He returns today for evaluation.  REVIEW OF SYSTEMS: Out of a complete 14 system review of symptoms, the patient complains only of the following symptoms, and all other reviewed systems are negative.  How likely are you to doze in the following situations: 0 = not likely, 1 = slight chance, 2 = moderate chance, 3 = high chance  Sitting and Reading? Watching Television? Sitting inactive in a public place (theater or meeting)? Lying down in the afternoon when circumstances permit? Sitting and talking to someone? Sitting quietly after lunch without alcohol? In a car, while stopped for a few minutes in traffic? As a passenger in a car for an hour without a break?  Total = 3  FSS 28 points.    ALLERGIES: Allergies  Allergen Reactions  . Strattera [Atomoxetine Hcl] Other (See Comments)    CAUSED AGGRESSION  . Sulfa Antibiotics Hives  . Guava Flavor     HOME MEDICATIONS: Outpatient Medications Prior to Visit  Medication Sig Dispense Refill  . albuterol (VENTOLIN HFA) 108 (  90 Base) MCG/ACT inhaler Inhale 2 puffs into the lungs every 4 (four) hours as needed for wheezing or shortness of breath. 18 g 2  . buPROPion (WELLBUTRIN XL) 150 MG 24 hr tablet Take 1 tablet (150 mg total) by mouth every morning. 30 tablet 2  . ibuprofen (ADVIL,MOTRIN) 600 MG tablet Take 1 tablet (600 mg total) by mouth every 8 (eight) hours as needed for headache or moderate pain. 45 tablet 1  . loratadine (CLARITIN) 10 MG tablet Take 1 tablet (10 mg total) by mouth daily. 30 tablet 5  . Phenylephrine-DM-GG-APAP (TYLENOL COLD/FLU SEVERE PO) Take by mouth. Takes prn (equate brand)    . naproxen (NAPROSYN) 500 MG tablet Take 1 tablet (500 mg total) by mouth 2 (two)  times daily with a meal. 30 tablet 0   No facility-administered medications prior to visit.    PAST MEDICAL HISTORY: Past Medical History:  Diagnosis Date  . ADHD (attention deficit hyperactivity disorder)    no current med.  . Asthma    prn inhaler  . Depression   . Migraines   . 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 OCD (obsessive compulsive disorder)   . Oppositional defiant disorder   . PTSD (post-traumatic stress disorder)   . Seizures ([place])   . Stuffy nose 07/01/2017    PAST SURGICAL HISTORY: Past Surgical History:  Procedure Laterality Date  . GANGLION CYST EXCISION Right 07/03/2017   Procedure: RIGHT HAND NEUROMA REMOVAL;  Surgeon: Leandrew Koyanagi, MD;  Location: [facility];  Service: Orthopedics;  Laterality: Right;  . neuroma of hand removed    . TENDON LENGTHENING Bilateral    as a child  . TOOTH EXTRACTION      FAMILY HISTORY: Family History  Problem Relation Age of Onset  . Bipolar disorder Cousin   . Alcohol abuse Paternal Aunt   . Drug abuse Paternal Aunt   . Diabetes Mother   . Heart attack Father   . Cancer Father     SOCIAL HISTORY: Social History   Socioeconomic History  . Marital status: Divorced    Spouse name: Not on file  . Number of children: Not on file  . Years of education: Not on file  . Highest education level: Not on file  Occupational History  . Not on file  Tobacco Use  . Smoking status: Current Every Day Smoker    Packs/day: 0.01    Types: Cigarettes  . Smokeless tobacco: Former Systems developer    Types: [place] date: 02/12/2016  Vaping Use  . Vaping Use: Never used  Substance and Sexual Activity  . Alcohol use: Yes    Comment: occasional  . Drug use: No  . Sexual activity: Yes    Partners: Female  Other Topics Concern  . Not on file  Social History Narrative  . Not on file   Social Determinants of Health   Financial Resource Strain: Not on file  Food Insecurity: Not on file  Transportation Needs: Not  on file  Physical Activity: Not on file  Stress: Not on file  Social Connections: Not on file  Intimate Partner Violence: Not on file      PHYSICAL EXAM  Vitals:   01/24/21 1525  BP: 121/65  Pulse: 81  Weight: 193 lb (87.5 kg)  Height: 5\' 6"  (1.676 m)   Body mass index is 31.15 kg/m.  Generalized: Well developed, in no acute distress  Chest: Lungs clear to auscultation  bilaterally Tattooed.  Neurological examination  Mentation: Alert oriented to time, place, history taking. Follows all commands speech and language fluent Cranial nerve : No loss of smell or taste . Extraocular movements were full, visual field were full on confrontational test Head turning and shoulder shrug  were normal and symmetric.   DIAGNOSTIC DATA (LABS, IMAGING, TESTING) - I reviewed patient records, labs, notes, testing and imaging myself where available.  Lab Results  Component Value Date   WBC 12.3 (H) 07/05/2017   HGB 14.8 07/05/2017   HCT 44.1 07/05/2017   MCV 85.5 07/05/2017   PLT 256 07/05/2017      Component Value Date/Time   NA 144 01/31/2018 1149   K 5.1 01/31/2018 1149   CL 105 01/31/2018 1149   CO2 21 01/31/2018 1149   GLUCOSE 91 01/31/2018 1149   GLUCOSE 124 (H) 07/05/2017 1417   BUN 11 01/31/2018 1149   CREATININE 0.74 (L) 01/31/2018 1149   CREATININE 0.66 09/22/2013 1050   CALCIUM 9.7 01/31/2018 1149   PROT 7.8 01/31/2018 1149   ALBUMIN 4.6 01/31/2018 1149   AST 16 01/31/2018 1149   ALT 28 01/31/2018 1149   ALKPHOS 66 01/31/2018 1149   BILITOT 0.3 01/31/2018 1149   GFRNONAA 128 01/31/2018 1149   GFRAA 148 01/31/2018 1149   Lab Results  Component Value Date   CHOL 220 (H) 01/31/2018   HDL 39 (L) 01/31/2018   LDLCALC 144 (H) 01/31/2018   TRIG 183 (H) 01/31/2018   CHOLHDL 5.6 (H) 01/31/2018   Lab Results  Component Value Date   HGBA1C 5.3 01/31/2018   No results found for: [id number] Lab Results  Component Value Date   TSH 0.774 08/01/2017       ASSESSMENT AND PLAN 30 y.o. 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), anxiety and panic attacks, and Stuffy nose (07/01/2017). here with:  1. OSA on CPAP, highly compliant at 97% . Low residual AHI , needs new supplies.   - Encourage patient to use CPAP nightly and > 4 hours each night - F/U in 1 year or sooner if needed   I spent 25 minutes of face-to-face and non-face-to-face time with patient.  This included previsit chart review, lab review, study review, order entry, electronic health record documentation, patient education.   Larey Seat, MD   01/24/2021, 3:46 PM [facility] [address], [place], [place] [address] [phone]

## 2021-02-06 ENCOUNTER — Encounter: Payer: Self-pay | Admitting: Neurology

## 2021-02-06 ENCOUNTER — Other Ambulatory Visit: Payer: Self-pay

## 2021-02-06 ENCOUNTER — Encounter: Payer: Medicaid Other | Attending: Psychology | Admitting: Psychology

## 2021-02-06 ENCOUNTER — Telehealth: Payer: Self-pay | Admitting: Neurology

## 2021-02-06 DIAGNOSIS — F422 Mixed obsessional thoughts and acts: Secondary | ICD-10-CM | POA: Insufficient documentation

## 2021-02-06 DIAGNOSIS — F39 Unspecified mood [affective] disorder: Secondary | ICD-10-CM | POA: Diagnosis not present

## 2021-02-06 DIAGNOSIS — G4733 Obstructive sleep apnea (adult) (pediatric): Secondary | ICD-10-CM | POA: Insufficient documentation

## 2021-02-06 DIAGNOSIS — Z9989 Dependence on other enabling machines and devices: Secondary | ICD-10-CM | POA: Insufficient documentation

## 2021-02-06 NOTE — Telephone Encounter (Signed)
Our office does not complete PA for CPAP supplies. This is completed through his DME company. We only send the orders to the DME company.

## 2021-02-06 NOTE — Telephone Encounter (Signed)
Pt states he is still being told a PA is needed for his CPAP supplies, pt is asking to be called.

## 2021-02-08 DIAGNOSIS — G4733 Obstructive sleep apnea (adult) (pediatric): Secondary | ICD-10-CM | POA: Diagnosis not present
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
## 2021-04-10 ENCOUNTER — Other Ambulatory Visit: Payer: Self-pay

## 2021-04-10 ENCOUNTER — Encounter: Payer: Medicaid Other | Attending: Psychology | Admitting: Psychology

## 2021-04-10 DIAGNOSIS — G4733 Obstructive sleep apnea (adult) (pediatric): Secondary | ICD-10-CM | POA: Insufficient documentation

## 2021-04-10 DIAGNOSIS — Z9989 Dependence on other enabling machines and devices: Secondary | ICD-10-CM | POA: Diagnosis not present

## 2021-04-10 DIAGNOSIS — F422 Mixed obsessional thoughts and acts: Secondary | ICD-10-CM | POA: Insufficient documentation

## 2021-04-10 DIAGNOSIS — F39 Unspecified mood [affective] disorder: Secondary | ICD-10-CM | POA: Diagnosis not present

## 2021-04-11 ENCOUNTER — Encounter: Payer: Self-pay | Admitting: Psychology
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
## 2021-05-22 ENCOUNTER — Encounter: Payer: Medicaid Other | Attending: Psychology | Admitting: Psychology

## 2021-05-22 ENCOUNTER — Encounter: Payer: Medicaid Other | Admitting: Psychology

## 2021-05-22 DIAGNOSIS — F39 Unspecified mood [affective] disorder: Secondary | ICD-10-CM | POA: Insufficient documentation

## 2021-05-22 DIAGNOSIS — F422 Mixed obsessional thoughts and acts: Secondary | ICD-10-CM | POA: Insufficient documentation

## 2021-05-30 ENCOUNTER — Encounter: Payer: Self-pay | Admitting: Psychology
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
## 2021-06-01 ENCOUNTER — Telehealth: Payer: Medicaid Other | Admitting: Psychology

## 2021-06-02 ENCOUNTER — Encounter: Payer: Self-pay | Admitting: Family Medicine

## 2021-07-11 ENCOUNTER — Other Ambulatory Visit: Payer: Medicaid Other

## 2021-07-17 ENCOUNTER — Other Ambulatory Visit: Payer: Self-pay

## 2021-07-17 ENCOUNTER — Other Ambulatory Visit: Payer: Self-pay | Admitting: Family Medicine

## 2021-07-17 ENCOUNTER — Ambulatory Visit
Admission: EM | Admit: 2021-07-17 | Discharge: 2021-07-17 | Disposition: A | Discharge status: Home / Self Care | Payer: Medicaid Other | Attending: Family Medicine | Admitting: Family Medicine

## 2021-07-17 ENCOUNTER — Encounter: Payer: Self-pay | Admitting: Emergency Medicine

## 2021-07-17 DIAGNOSIS — J069 Acute upper respiratory infection, unspecified: Secondary | ICD-10-CM | POA: Diagnosis not present

## 2021-07-17 DIAGNOSIS — J4521 Mild intermittent asthma with (acute) exacerbation: Secondary | ICD-10-CM | POA: Diagnosis not present

## 2021-07-17 MED ORDER — PROMETHAZINE-DM 6.25-15 MG/5ML PO SYRP: 5.0000 mL | ORAL_SOLUTION | Freq: Four times a day (QID) | ORAL | 0 refills | Status: DC | PRN

## 2021-07-17 MED ORDER — IBUPROFEN 600 MG PO TABS: 600.0000 mg | ORAL_TABLET | Freq: Three times a day (TID) | ORAL | 1 refills | Status: DC | PRN

## 2021-07-17 MED ORDER — OSELTAMIVIR PHOSPHATE 75 MG PO CAPS: 75.0000 mg | ORAL_CAPSULE | Freq: Two times a day (BID) | ORAL | 0 refills | Status: DC

## 2021-07-17 MED ORDER — ALBUTEROL SULFATE HFA 108 (90 BASE) MCG/ACT IN AERS: 1.0000 | INHALATION_SPRAY | Freq: Four times a day (QID) | RESPIRATORY_TRACT | 0 refills | Status: DC | PRN

## 2021-07-17 MED ORDER — PREDNISONE 20 MG PO TABS: 40.0000 mg | ORAL_TABLET | Freq: Every day | ORAL | 0 refills | Status: DC

## 2021-07-17 NOTE — Telephone Encounter (Signed)
Patient is requesting refill on ibuprofen 600 mg called into [facility]

## 2021-07-17 NOTE — ED Triage Notes (Signed)
Pt presents with cough, headache, sob, fever and body aches xs 5 days.

## 2021-07-18 LAB — COVID-19, FLU A+B NAA
Influenza A, NAA: NOT DETECTED
Influenza B, NAA: NOT DETECTED
SARS-CoV-2, NAA: NOT DETECTED

## 2021-07-20 ENCOUNTER — Other Ambulatory Visit: Payer: Self-pay

## 2021-07-20 ENCOUNTER — Ambulatory Visit
Admission: EM | Admit: 2021-07-20 | Discharge: 2021-07-20 | Disposition: A | Discharge status: Home / Self Care | Payer: Medicaid Other | Attending: Urgent Care | Admitting: Urgent Care

## 2021-07-20 DIAGNOSIS — S80811A Abrasion, right lower leg, initial encounter: Secondary | ICD-10-CM | POA: Diagnosis not present

## 2021-07-20 DIAGNOSIS — T148XXA Other injury of unspecified body region, initial encounter: Secondary | ICD-10-CM

## 2021-07-20 DIAGNOSIS — L089 Local infection of the skin and subcutaneous tissue, unspecified: Secondary | ICD-10-CM | POA: Diagnosis not present

## 2021-07-20 DIAGNOSIS — M79661 Pain in right lower leg: Secondary | ICD-10-CM

## 2021-07-20 MED ORDER — DOXYCYCLINE HYCLATE 100 MG PO CAPS: 100.0000 mg | ORAL_CAPSULE | Freq: Two times a day (BID) | ORAL | 0 refills | Status: DC

## 2021-07-20 MED ORDER — NAPROXEN 500 MG PO TABS: 500.0000 mg | ORAL_TABLET | Freq: Two times a day (BID) | ORAL | 0 refills | Status: DC
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
## 2021-07-20 NOTE — ED Provider Notes (Signed)
[facility]    CSN: [id number] Arrival date & time: 07/17/21  1007      History   Chief Complaint Chief Complaint  Patient presents with   Fever   Cough   Generalized Body Aches   Headache    HPI [name] is a 30 y.o. male.   Presenting today with 5 day history of cough, headache, fever, chills, body aches, chest tightness, fatigue. Denies CP, SOB, abdominal pain, N/V/D. So far taking OTC cold and congestion medications with minimal relief. Multiple sick contacts recently. Hx of asthma and allergic rhinitis on antihistamines and albuterol.    Past Medical History:  Diagnosis Date   ADHD (attention deficit hyperactivity disorder)    no current med.   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   OCD (obsessive compulsive disorder)    Oppositional defiant disorder    PTSD (post-traumatic stress disorder)    Seizures ([place])    Stuffy nose 07/01/2017    Patient Active Problem List   Diagnosis Date Noted   Acute gastritis without hemorrhage 04/22/2020   Viral gastroenteritis 04/22/2020   OSA on CPAP 10/15/2018   Hyperlipidemia 01/31/2018   Sleep disorder, circadian, delayed sleep phase type 11/11/2017   Mild intermittent asthma 11/11/2017   Excessive daytime sleepiness 11/11/2017   Hypokalemia 07/08/2017   Neuroma of hand 06/24/2017   Snoring 12/08/2015   OCD (obsessive compulsive disorder) 08/06/2012   Unspecified episodic mood disorder 07/18/2011   ADHD (attention deficit hyperactivity disorder), combined type 07/18/2011   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 Authorizing Provider  albuterol (VENTOLIN HFA) 108 (90 Base) MCG/ACT inhaler Inhale 1-2 puffs into the lungs every 6 (six) hours as needed for wheezing or shortness of breath. 07/17/21  Yes Volney American, PA-C  oseltamivir (TAMIFLU) 75 MG capsule Take 1 capsule (75 mg total) by mouth every 12 (twelve) hours. 07/17/21  Yes Volney American, PA-C  predniSONE (DELTASONE) 20 MG tablet Take 2 tablets (40 mg total) by mouth daily with breakfast. 07/17/21  Yes Volney American, PA-C  promethazine-dextromethorphan (PROMETHAZINE-DM) 6.25-15 MG/5ML syrup Take 5 mLs by mouth 4 (four) times daily as needed for cough. 07/17/21  Yes Volney American, PA-C  albuterol (VENTOLIN HFA) 108 (90 Base) MCG/ACT inhaler Inhale 2 puffs into the lungs every 4 (four) hours as needed for wheezing or shortness of breath. 06/14/20   Kathyrn Drown, MD  buPROPion (WELLBUTRIN XL) 150 MG 24 hr tablet Take 1 tablet (150 mg total) by mouth every morning. 04/19/20 04/19/21  Cloria Spring, MD  doxycycline (VIBRAMYCIN) 100 MG capsule Take 1 capsule (100 mg total) by mouth 2 (two) times daily. 07/20/21   Jaynee Eagles, PA-C  ibuprofen (ADVIL) 600 MG tablet Take 1 tablet (600 mg total) by mouth every 8 (eight) hours as needed for headache  or moderate pain. 07/17/21   Kathyrn Drown, MD  loratadine (CLARITIN) 10 MG tablet Take 1 tablet (10 mg total) by mouth daily. 11/09/19   Kathyrn Drown, MD  naproxen (NAPROSYN) 500 MG tablet Take 1 tablet (500 mg total) by mouth 2 (two) times daily with a meal. 07/20/21   Jaynee Eagles, PA-C  Phenylephrine-DM-GG-APAP (TYLENOL COLD/FLU SEVERE PO) Take by mouth. Takes prn (equate brand)    [provider]    Family History Family History  Problem Relation Age of Onset   Diabetes Mother    Heart attack  Father    Cancer Father    Alcohol abuse Paternal Aunt    Drug abuse Paternal Aunt    Bipolar disorder Cousin     Social History Social History   Tobacco Use   Smoking status: Every Day    Packs/day: 0.01    Types: Cigarettes   Smokeless tobacco: Former    Types: Chew    Quit date: 02/12/2016  Vaping Use   Vaping Use: Never used  Substance Use Topics   Alcohol use: Yes    Comment: occasional   Drug use: No     Allergies   Strattera [atomoxetine hcl], Sulfa antibiotics, and Guava flavor   Review of Systems Review of Systems PER HPI   Physical Exam Triage Vital Signs ED Triage Vitals [07/17/21 1415]  Enc Vitals Group     BP (!) 151/104     Pulse Rate 82     Resp (!) 22     Temp 99 F (37.2 C)     Temp Source Oral     SpO2 98 %     Weight      Height      Head Circumference      Peak Flow      Pain Score 3     Pain Loc      Pain Edu?      Excl. in [place]?    No data found.  Updated Vital Signs BP (!) 151/104   Pulse 82   Temp 99 F (37.2 C) (Oral)   Resp (!) 22   SpO2 98%   Visual Acuity Right Eye Distance:   Left Eye Distance:   Bilateral Distance:    Right Eye Near:   Left Eye Near:    Bilateral Near:     Physical Exam Vitals and nursing note reviewed.  Constitutional:      Appearance: He is well-developed.  HENT:     Head: Atraumatic.     Right Ear: External ear normal.     Left Ear: External ear normal.     Nose: Rhinorrhea present.     Mouth/Throat:     Pharynx: Posterior oropharyngeal erythema present. No oropharyngeal exudate.  Eyes:     Conjunctiva/sclera: Conjunctivae normal.     Pupils: Pupils are equal, round, and reactive to light.  Cardiovascular:     Rate and Rhythm: Normal rate and regular rhythm.  Pulmonary:     Effort: Pulmonary effort is normal. No respiratory distress.     Breath sounds: Wheezing present. No rales.  Abdominal:     General: Bowel sounds are normal. There is no distension.     Palpations: Abdomen  is soft.     Tenderness: There is no abdominal tenderness.  Musculoskeletal:        General: Normal range of motion.     Cervical back: Normal range of motion and neck supple.  Lymphadenopathy:  Cervical: No cervical adenopathy.  Skin:    General: Skin is warm and dry.  Neurological:     Mental Status: He is alert and oriented to person, place, and time.  Psychiatric:        Behavior: Behavior normal.     UC Treatments / Results  Labs (all labs ordered are listed, but only abnormal results are displayed) Labs Reviewed  COVID-19, FLU A+B NAA   Narrative:    Performed at:  [address] [facility] [facility] [address], [place], [place]  [id number] Lab Director: Rush Farmer MD, Phone:  [phone]    EKG   Radiology No results found.  Procedures Procedures (including critical care time)  Medications Ordered in UC Medications - No data to display  Initial Impression / Assessment and Plan / UC Course  I have reviewed the triage vital signs and the nursing notes.  Pertinent labs & imaging results that were available during my care of the patient were reviewed by me and considered in my medical decision making (see chart for details).     Vitals reassuring, suspect viral URI likely influenza. COVID and flu testing pending, start tamiflu and treat asthma exacerbation with prednisone, albuterol, phenergan dm. Return for acutely worsening sxs.   Final Clinical Impressions(s) / UC Diagnoses   Final diagnoses:  Viral URI with cough  Mild intermittent asthma with acute exacerbation   Discharge Instructions   None    ED Prescriptions     Medication Sig Dispense Auth. Provider   predniSONE (DELTASONE) 20 MG tablet Take 2 tablets (40 mg total) by mouth daily with breakfast. 10 tablet Volney American, PA-C   albuterol (VENTOLIN HFA) 108 (90 Base) MCG/ACT inhaler Inhale 1-2 puffs into the lungs every 6 (six) hours as needed for wheezing or shortness of breath. [place], [place]   promethazine-dextromethorphan (PROMETHAZINE-DM) 6.25-15 MG/5ML syrup Take 5 mLs by mouth 4 (four) times daily as needed for cough. 100 mL Volney American, [place]   oseltamivir (TAMIFLU) 75 MG capsule Take 1 capsule (75 mg total) by mouth every 12 (twelve) hours. 10 capsule Volney American, [place]      PDMP not reviewed this encounter.   Volney American, [place] 07/20/21 2256
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
## 2021-07-20 NOTE — Discharge Instructions (Signed)
You have an infected abrasion and need an antibiotic, doxycycline. Please change your dressing 3-5 times daily. Do not apply any ointments or creams. Each time you change your dressing, make sure that you are pressing on the wound to get pus to come out.  Try your best to have a family member help you clean gently around the perimeter of the wound with gentle soap and warm water. Pat your wound dry and let it air out if possible to make sure it is dry before reapplying another dressing. Use naproxen for pain and inflammation.

## 2021-08-07 DIAGNOSIS — G4733 Obstructive sleep apnea (adult) (pediatric): Secondary | ICD-10-CM | POA: Diagnosis not present

## 2021-08-08 ENCOUNTER — Other Ambulatory Visit: Payer: Self-pay | Admitting: Family Medicine

## 2021-08-08 NOTE — Telephone Encounter (Signed)
Provider no longer at office Requested Prescriptions  Pending Prescriptions Disp Refills   VENTOLIN HFA 108 (90 Base) MCG/ACT inhaler [Pharmacy Med Name: VENTOLIN HFA INH W/DOS CTR [id number]] 18 g 0    Sig: INHALE 1 TO 2 PUFFS INTO THE LUNGS EVERY 6 HOURS AS NEEDED FOR WHEEZING OR SHORTNESS OF BREATH     Pulmonology:  Beta Agonists Failed - 08/08/2021  4:06 AM      Failed - One inhaler should last at least one month. If the patient is requesting refills earlier, contact the patient to check for uncontrolled symptoms.      Failed - Valid encounter within last 12 months    Recent Outpatient Visits   None

## 2021-09-11 ENCOUNTER — Ambulatory Visit
Admission: EM | Admit: 2021-09-11 | Discharge: 2021-09-11 | Disposition: A | Discharge status: Home / Self Care | Payer: Medicaid Other | Attending: Family Medicine | Admitting: Family Medicine

## 2021-09-11 ENCOUNTER — Other Ambulatory Visit: Payer: Self-pay

## 2021-09-11 ENCOUNTER — Encounter: Payer: Self-pay | Admitting: Emergency Medicine

## 2021-09-11 DIAGNOSIS — R197 Diarrhea, unspecified: Secondary | ICD-10-CM | POA: Diagnosis not present

## 2021-09-11 DIAGNOSIS — R52 Pain, unspecified: Secondary | ICD-10-CM | POA: Diagnosis not present

## 2021-09-11 DIAGNOSIS — R112 Nausea with vomiting, unspecified: Secondary | ICD-10-CM

## 2021-09-11 MED ORDER — ONDANSETRON 4 MG PO TBDP
4.0000 mg | ORAL_TABLET | Freq: Once | ORAL | Status: AC
  Administered 2021-09-11: 4 mg via ORAL

## 2021-09-11 MED ORDER — LOPERAMIDE HCL 2 MG PO CAPS: 2.0000 mg | ORAL_CAPSULE | Freq: Four times a day (QID) | ORAL | 0 refills | Status: DC | PRN

## 2021-09-11 MED ORDER — ONDANSETRON 8 MG PO TBDP: 8.0000 mg | ORAL_TABLET | Freq: Three times a day (TID) | ORAL | 0 refills | Status: DC | PRN

## 2021-09-11 NOTE — ED Provider Notes (Signed)
[facility]    CSN: [id number] Arrival date & time: 09/11/21  1922      History   Chief Complaint Chief Complaint  Patient presents with   Emesis   Diarrhea    HPI [name] is a 31 y.o. male.   Presenting today with sudden onset nausea, vomiting, diarrhea, abdominal pain, chills, body aches since this morning.  Denies cough, congestion, sore throat, dizziness, syncope.  Trying ibuprofen and Pepto-Bismol with no relief.  Daughter recently with similar symptoms.  No chronic GI issues.   Past Medical History:  Diagnosis Date   ADHD (attention deficit hyperactivity disorder)    no current med.   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   OCD (obsessive compulsive disorder)    Oppositional defiant disorder    PTSD (post-traumatic stress disorder)    Seizures ([place])    Stuffy nose 07/01/2017    Patient Active Problem List   Diagnosis Date Noted   Acute gastritis without hemorrhage 04/22/2020   Viral gastroenteritis 04/22/2020   OSA on CPAP 10/15/2018   Hyperlipidemia 01/31/2018   Sleep disorder, circadian, delayed sleep phase type 11/11/2017   Mild intermittent asthma 11/11/2017   Excessive daytime sleepiness 11/11/2017   Hypokalemia 07/08/2017   Neuroma of hand 06/24/2017   Snoring 12/08/2015   OCD (obsessive compulsive disorder) 08/06/2012   Unspecified episodic mood disorder 07/18/2011   ADHD (attention deficit hyperactivity disorder), combined type 07/18/2011   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 Authorizing Provider  albuterol (VENTOLIN HFA) 108 (90 Base) MCG/ACT inhaler Inhale 2 puffs into the lungs every 4 (four) hours as needed for wheezing or shortness of breath. 06/14/20  Yes Kathyrn Drown, MD  loperamide (IMODIUM) 2 MG capsule Take 1 capsule (2 mg total) by mouth 4 (four) times daily as needed for diarrhea or loose stools. 09/11/21  Yes Volney American, PA-C  ondansetron (ZOFRAN-ODT) 8 MG disintegrating tablet Take 1 tablet (8 mg total) by mouth every 8 (eight) hours as needed for nausea or vomiting. 09/11/21  Yes Volney American, PA-C  albuterol (VENTOLIN HFA) 108 (90 Base) MCG/ACT inhaler Inhale 1-2 puffs into the lungs every 6 (six) hours as needed for wheezing or shortness of breath. 07/17/21   Volney American, PA-C  buPROPion (WELLBUTRIN XL) 150 MG 24 hr tablet Take 1 tablet (150 mg total) by mouth every morning. 04/19/20 04/19/21  Cloria Spring, MD  doxycycline (VIBRAMYCIN) 100 MG capsule Take 1 capsule (100 mg total) by mouth 2 (two) times daily. 07/20/21   Jaynee Eagles, PA-C  ibuprofen (ADVIL) 600 MG tablet Take 1 tablet (600 mg total) by mouth every 8 (eight) hours as needed for headache or moderate pain. 07/17/21   Kathyrn Drown, MD  loratadine (CLARITIN) 10 MG tablet Take 1 tablet (10 mg total) by mouth  daily. 11/09/19   Kathyrn Drown, MD  naproxen (NAPROSYN) 500 MG tablet Take 1 tablet (500 mg total) by mouth 2 (two) times daily with a meal. 07/20/21   Jaynee Eagles, PA-C  oseltamivir (TAMIFLU) 75 MG capsule Take 1 capsule (75 mg total) by mouth every 12 (twelve) hours. 07/17/21   Volney American, PA-C  Phenylephrine-DM-GG-APAP (TYLENOL COLD/FLU SEVERE PO) Take by mouth. Takes prn (equate brand)    [provider]  predniSONE (DELTASONE) 20 MG tablet Take 2 tablets (40 mg total) by mouth daily with breakfast. 07/17/21   Volney American, PA-C  promethazine-dextromethorphan (PROMETHAZINE-DM) 6.25-15 MG/5ML syrup Take 5 mLs by mouth 4 (four) times daily as needed for cough. 07/17/21   Volney American, PA-C    Family History Family History  Problem Relation Age of Onset   Diabetes Mother    Heart attack Father    Cancer Father    Alcohol abuse Paternal Aunt    Drug abuse Paternal Aunt    Bipolar disorder Cousin     Social History Social History   Tobacco Use   Smoking status: Every Day    Packs/day: 0.01    Types: Cigarettes   Smokeless tobacco: Former    Types: Chew    Quit date: 02/12/2016  Vaping Use   Vaping Use: Never used  Substance Use Topics   Alcohol use: Yes    Comment: occasional   Drug use: No     Allergies   Strattera [atomoxetine hcl], Sulfa antibiotics, and Guava flavor   Review of Systems Review of Systems Per HPI  Physical Exam Triage Vital Signs ED Triage Vitals  Enc Vitals Group     BP 09/11/21 1930 127/80     Pulse Rate 09/11/21 1930 94     Resp 09/11/21 1930 20     Temp 09/11/21 1930 98.7 F (37.1 C)     Temp Source 09/11/21 1930 Oral     SpO2 09/11/21 1930 98 %     Weight --      Height --      Head Circumference --      Peak Flow --      Pain Score 09/11/21 1932 8     Pain Loc --      Pain Edu? --      Excl. in [place]? --    No data found.  Updated Vital Signs BP 127/80 (BP Location: Right Arm)    Pulse 94    Temp 98.7 F (37.1 C) (Oral)    Resp 20    SpO2 98%   Visual Acuity Right Eye Distance:   Left Eye Distance:   Bilateral Distance:    Right Eye Near:   Left Eye Near:    Bilateral Near:     Physical Exam Vitals and nursing note reviewed.  Constitutional:      Appearance: Normal appearance.  HENT:     Head: Atraumatic.     Right Ear: Tympanic membrane normal.     Left Ear: Tympanic membrane normal.     Nose: Nose normal.     Mouth/Throat:     Mouth: Mucous membranes are moist.  Eyes:     Extraocular Movements: Extraocular  movements intact.     Conjunctiva/sclera: Conjunctivae normal.  Cardiovascular:     Rate and Rhythm: Normal rate and regular rhythm.  Pulmonary:     Effort: Pulmonary effort is normal. No respiratory distress.     Breath sounds: Normal breath  sounds. No wheezing.  Abdominal:     General: Bowel sounds are normal. There is no distension.     Palpations: Abdomen is soft.     Tenderness: There is abdominal tenderness. There is no right CVA tenderness, left CVA tenderness or guarding.  Musculoskeletal:        General: Normal range of motion.     Cervical back: Normal range of motion and neck supple.  Skin:    General: Skin is warm and dry.  Neurological:     General: No focal deficit present.     Mental Status: He is oriented to person, place, and time.     Motor: No weakness.     Gait: Gait normal.  Psychiatric:        Mood and Affect: Mood normal.        Thought Content: Thought content normal.        Judgment: Judgment normal.   UC Treatments / Results  Labs (all labs ordered are listed, but only abnormal results are displayed) Labs Reviewed - No data to display  EKG  Radiology No results found.  Procedures Procedures (including critical care time)  Medications Ordered in UC Medications  ondansetron (ZOFRAN-ODT) disintegrating tablet 4 mg (4 mg Oral Given 09/11/21 1942)    Initial Impression / Assessment and Plan / UC Course  I have reviewed the triage vital signs and the nursing notes.  Pertinent labs & imaging results that were available during my care of the patient were reviewed by me and considered in my medical decision making (see chart for details).     Vital signs benign and reassuring, Zofran administered for active nausea and vomiting in triage.  Suspect viral GI illness.  Declines viral testing today for COVID and flu, recently had flu several weeks ago.  Treat with Zofran, Imodium, brat diet, fluids.  Return for acutely worsening symptoms  Final Clinical  Impressions(s) / UC Diagnoses   Final diagnoses:  Nausea vomiting and diarrhea  Body aches   Discharge Instructions   None    ED Prescriptions     Medication Sig Dispense Auth. Provider   ondansetron (ZOFRAN-ODT) 8 MG disintegrating tablet Take 1 tablet (8 mg total) by mouth every 8 (eight) hours as needed for nausea or vomiting. 20 tablet Volney American, [place]   loperamide (IMODIUM) 2 MG capsule Take 1 capsule (2 mg total) by mouth 4 (four) times daily as needed for diarrhea or loose stools. 12 capsule Volney American, [place]      PDMP not reviewed this encounter.   Volney American, [place] 09/11/21 1956

## 2021-09-11 NOTE — ED Triage Notes (Addendum)
Patient c/o emesis and diarrhea that started today.   Patient endorses " I'm unable to keep water down".   Patient endorses ABD pain.   Patient denies fever.   Patient endorses chills.   Patient has taken pepto bismol and ibuprofen with no relief of symptoms.

## 2021-09-27 ENCOUNTER — Encounter: Payer: Medicaid Other | Attending: Psychology | Admitting: Psychology

## 2021-09-27 ENCOUNTER — Other Ambulatory Visit: Payer: Self-pay

## 2021-09-27 DIAGNOSIS — F39 Unspecified mood [affective] disorder: Secondary | ICD-10-CM | POA: Diagnosis not present

## 2021-09-27 DIAGNOSIS — G4733 Obstructive sleep apnea (adult) (pediatric): Secondary | ICD-10-CM | POA: Insufficient documentation

## 2021-09-27 DIAGNOSIS — Z9989 Dependence on other enabling machines and devices: Secondary | ICD-10-CM | POA: Insufficient documentation

## 2021-09-27 DIAGNOSIS — F422 Mixed obsessional thoughts and acts: Secondary | ICD-10-CM | POA: Diagnosis not present

## 2021-09-28 ENCOUNTER — Encounter: Payer: Self-pay | Admitting: Psychology
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
## 2021-10-03 ENCOUNTER — Ambulatory Visit: Payer: Medicaid Other | Admitting: Family Medicine

## 2021-10-03 ENCOUNTER — Ambulatory Visit (HOSPITAL_COMMUNITY)
Admission: RE | Admit: 2021-10-03 | Discharge: 2021-10-03 | Disposition: A | Discharge status: Home / Self Care | Payer: Medicaid Other | Source: Ambulatory Visit | Attending: Family Medicine | Admitting: Family Medicine

## 2021-10-03 ENCOUNTER — Other Ambulatory Visit: Payer: Self-pay

## 2021-10-03 VITALS — BP 124/85 | HR 72 | Temp 98.7°F | Ht 66.0 in | Wt 196.8 lb

## 2021-10-03 DIAGNOSIS — M25561 Pain in right knee: Secondary | ICD-10-CM

## 2021-10-03 DIAGNOSIS — M25562 Pain in left knee: Secondary | ICD-10-CM | POA: Insufficient documentation

## 2021-10-03 MED ORDER — MELOXICAM 15 MG PO TABS: 15.0000 mg | ORAL_TABLET | Freq: Every day | ORAL | 0 refills | Status: DC

## 2021-10-03 NOTE — Progress Notes (Signed)
° °  Subjective:    Patient ID: [name], male    DOB: 1991-06-26, 30 y.o.   MRN: [id number]  HPI Patient here for pain in both knees, left knee hurts worse than right knee. Both knees stays between 2-7 on pain scale of 1-10. Patient needs refill on Loratadine. Patient bilateral knee pain aching throbbing stiff worse on the left than the right at times feels like it wants to give way no falls  Review of Systems     Objective:   Physical Exam  General-in no acute distress Eyes-no discharge Lungs-respiratory rate normal, CTA CV-no murmurs,RRR Extremities skin warm dry no edema Neuro grossly normal Behavior normal, alert Ligaments appear stable.  Subjective pain and discomfort in both knees worse on the left than the right      Assessment & Plan:  X-ray left knee ordered Doubt any type of ligament tear Anti-inflammatory for the next 2 to 3 weeks Give [place] update within 2 to 3 weeks Follow-up in 4 weeks May need orthopedic referral for injection if ongoing troubles

## 2021-10-04 LAB — CBC WITH DIFFERENTIAL/PLATELET
Basophils Absolute: 0 10*3/uL (ref 0.0–0.2)
Basos: 0 %
EOS (ABSOLUTE): 0.2 10*3/uL (ref 0.0–0.4)
Eos: 2 %
Hematocrit: 50.1 % (ref 37.5–51.0)
Hemoglobin: 16.9 g/dL (ref 13.0–17.7)
Immature Grans (Abs): 0 10*3/uL (ref 0.0–0.1)
Immature Granulocytes: 0 %
Lymphocytes Absolute: 2.1 10*3/uL (ref 0.7–3.1)
Lymphs: 28 %
MCH: 29.3 pg (ref 26.6–33.0)
MCHC: 33.7 g/dL (ref 31.5–35.7)
MCV: 87 fL (ref 79–97)
Monocytes Absolute: 0.6 10*3/uL (ref 0.1–0.9)
Monocytes: 8 %
Neutrophils Absolute: 4.5 10*3/uL (ref 1.4–7.0)
Neutrophils: 62 %
Platelets: 274 10*3/uL (ref 150–450)
RBC: 5.76 x10E6/uL (ref 4.14–5.80)
RDW: 13.7 % (ref 11.6–15.4)
WBC: 7.4 10*3/uL (ref 3.4–10.8)

## 2021-10-04 LAB — SEDIMENTATION RATE: Sed Rate: 10 mm/hr (ref 0–15)

## 2021-10-04 LAB — C-REACTIVE PROTEIN: CRP: 4 mg/L (ref 0–10)

## 2021-10-24 ENCOUNTER — Other Ambulatory Visit: Payer: Self-pay

## 2021-10-24 ENCOUNTER — Ambulatory Visit: Payer: Medicaid Other | Admitting: Family Medicine

## 2021-10-24 ENCOUNTER — Encounter: Payer: Self-pay | Admitting: Family Medicine

## 2021-10-24 VITALS — BP 161/84 | HR 80 | Temp 98.5°F | Wt 193.6 lb

## 2021-10-24 DIAGNOSIS — N5089 Other specified disorders of the male genital organs: Secondary | ICD-10-CM | POA: Diagnosis not present

## 2021-10-24 DIAGNOSIS — N50812 Left testicular pain: Secondary | ICD-10-CM | POA: Diagnosis not present

## 2021-10-24 NOTE — Assessment & Plan Note (Signed)
Nontender on exam.  Favored to be benign.  Awaiting scrotal ultrasound which will be done tomorrow. ?

## 2021-10-24 NOTE — Patient Instructions (Signed)
We will call with [place] results. ? ?Take care ? ?Dr. Lacinda Axon  ?

## 2021-10-24 NOTE — Progress Notes (Signed)
? ?Subjective:  ?Patient ID: [name], male    DOB: [birth year]  Age: 31 y.o. MRN: [id number] ? ?CC: ?Chief Complaint  ?Patient presents with  ? Mass  ?  Pt found mass behind left testicle about one month. Area has grown. Area around mass is sore. No unusual swelling. Pt states cancer runs in his family   ? ? ?HPI: ? ?31 year old male presents for evaluation the above. ? ?Patient states that approximately 1 month ago he noticed an area of concern on his left testicle.  He states that on the inferior aspect on the back of his left testicle.  He states that it slightly painful.  Describes it as a pressure sensation.  He states that he feels that the area has been getting larger.  He states that cancer runs in his family.  He is concerned and anxious that this may be cancerous in nature.  Denies any other associated symptoms. ? ?Patient does note that he has had ongoing knee pain for which she has scheduled follow-up for in the near future. ? ?Patient Active Problem List  ? Diagnosis Date Noted  ? Mass of left testicle 10/24/2021  ? OSA on CPAP 10/15/2018  ? Hyperlipidemia 01/31/2018  ? Sleep disorder, circadian, delayed sleep phase type 11/11/2017  ? Mild intermittent asthma 11/11/2017  ? Hypokalemia 07/08/2017  ? Neuroma of hand 06/24/2017  ? Snoring 12/08/2015  ? OCD (obsessive compulsive disorder) 08/06/2012  ? Unspecified episodic mood disorder 07/18/2011  ? ADHD (attention deficit hyperactivity disorder), combined type 07/18/2011  ? 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 ? ?Social Hx   ?Social History  ? ?Socioeconomic History  ? Marital status: Divorced  ?  Spouse name: Not on file  ? Number of children: Not on file  ? Years of education: Not on file  ? Highest education level: Not on file  ?Occupational History  ? Not on file  ?Tobacco Use  ? Smoking status: Every Day  ?  Packs/day: 0.01  ?  Types: Cigarettes  ? Smokeless tobacco: Former  ?  Types: Chew  ?  Quit date: 02/12/2016  ?Vaping Use  ? Vaping Use:  Never used  ?Substance and Sexual Activity  ? Alcohol use: Yes  ?  Comment: occasional  ? Drug use: No  ? Sexual activity: Yes  ?  Partners: Female  ?Other Topics Concern  ? Not on file  ?Social History Narrative  ? Not on file  ? ?Social Determinants of Health  ? ?Financial Resource Strain: Not on file  ?Food Insecurity: Not on file  ?Transportation Needs: Not on file  ?Physical Activity: Not on file  ?Stress: Not on file  ?Social Connections: Not on file  ? ? ?Review of Systems ?Per HPI ? ?Objective:  ?BP (!) 161/84   Pulse 80   Temp 98.5 ?F (36.9 ?C)   Wt 193 lb 9.6 oz (87.8 kg)   SpO2 99%   BMI 31.25 kg/m?  ? ?BP/Weight 10/24/2021 10/03/2021 09/11/2021  ?Systolic BP [phone]  ?Diastolic BP 84 85 80  ?Wt. (Lbs) 193.6 196.8 -  ?BMI 31.25 31.76 -  ?Some encounter information is confidential and restricted. Go to Review Flowsheets activity to see all data.  ? ? ?Physical Exam ?Vitals and nursing note reviewed.  ?Constitutional:   ?   General: He is not in acute distress. ?   Appearance: Normal appearance.  ?HENT:  ?   Head: Normocephalic and atraumatic.  ?Pulmonary:  ?   Effort:  Pulmonary effort is normal. No respiratory distress.  ?Genitourinary: ?   Comments: Small, firm area noted on the inferior aspect of the right testicle.  Nontender. ?Neurological:  ?   Mental Status: He is alert.  ?Psychiatric:  ?   Comments: Anxious.  ? ? ?Lab Results  ?Component Value Date  ? WBC 7.4 10/03/2021  ? HGB 16.9 10/03/2021  ? HCT 50.1 10/03/2021  ? PLT 274 10/03/2021  ? GLUCOSE 91 01/31/2018  ? CHOL 220 (H) 01/31/2018  ? TRIG 183 (H) 01/31/2018  ? HDL 39 (L) 01/31/2018  ? LDLCALC 144 (H) 01/31/2018  ? ALT 28 01/31/2018  ? AST 16 01/31/2018  ? NA 144 01/31/2018  ? K 5.1 01/31/2018  ? CL 105 01/31/2018  ? CREATININE 0.74 (L) 01/31/2018  ? BUN 11 01/31/2018  ? CO2 21 01/31/2018  ? TSH 0.774 08/01/2017  ? HGBA1C 5.3 01/31/2018  ? ? ? ?Assessment & Plan:  ? ?Problem List Items Addressed This Visit   ? ?  ? Other  ? Mass of left  testicle - Primary  ?  Nontender on exam.  Favored to be benign.  Awaiting scrotal ultrasound which will be done tomorrow. ?  ?  ? Relevant Orders  ? US SCROTUM W/DOPPLER  ? ?Other Visit Diagnoses   ? ? Pain in left testicle      ? Relevant Orders  ? US SCROTUM W/DOPPLER  ? ?  ? ?Thersa Salt DO ?[place] ? ?

## 2021-10-25 ENCOUNTER — Ambulatory Visit (HOSPITAL_COMMUNITY)
Admission: RE | Admit: 2021-10-25 | Discharge: 2021-10-25 | Disposition: A | Discharge status: Home / Self Care | Payer: Medicaid Other | Source: Ambulatory Visit | Attending: Family Medicine | Admitting: Family Medicine

## 2021-10-25 DIAGNOSIS — N5089 Other specified disorders of the male genital organs: Secondary | ICD-10-CM | POA: Insufficient documentation

## 2021-10-25 DIAGNOSIS — N503 Cyst of epididymis: Secondary | ICD-10-CM | POA: Diagnosis not present

## 2021-10-25 DIAGNOSIS — N50812 Left testicular pain: Secondary | ICD-10-CM | POA: Diagnosis not present

## 2021-10-25 DIAGNOSIS — I861 Scrotal varices: Secondary | ICD-10-CM | POA: Diagnosis not present

## 2021-10-25 DIAGNOSIS — N433 Hydrocele, unspecified: Secondary | ICD-10-CM | POA: Diagnosis not present

## 2021-10-30 ENCOUNTER — Other Ambulatory Visit: Payer: Self-pay | Admitting: Family Medicine

## 2021-11-07 ENCOUNTER — Encounter: Payer: Self-pay | Admitting: Family Medicine

## 2021-11-07 ENCOUNTER — Ambulatory Visit: Payer: Medicaid Other | Admitting: Family Medicine

## 2021-11-07 ENCOUNTER — Other Ambulatory Visit: Payer: Self-pay

## 2021-11-07 VITALS — BP 131/87 | HR 83 | Temp 98.8°F | Ht 66.0 in | Wt 194.0 lb

## 2021-11-07 DIAGNOSIS — M25562 Pain in left knee: Secondary | ICD-10-CM | POA: Diagnosis not present

## 2021-11-07 DIAGNOSIS — M25561 Pain in right knee: Secondary | ICD-10-CM | POA: Diagnosis not present

## 2021-11-07 DIAGNOSIS — N5089 Other specified disorders of the male genital organs: Secondary | ICD-10-CM | POA: Diagnosis not present

## 2021-11-07 MED ORDER — NAPROXEN 500 MG PO TABS: ORAL_TABLET | ORAL | 2 refills | Status: DC

## 2021-11-07 NOTE — Progress Notes (Signed)
? ?  Subjective:  ? ? Patient ID: [name], male    DOB: 02/18/1991, 31 y.o.   MRN: [id number] ? ?HPI ?Follow up for knee pain left is worse than right knee  ? ?Had problems in middle school ?Had Laurene Footman then  ? ?Knees stiffen often ?Meloxicam did not help ?Pain from 2 to 7 ?Knee throbs at night ? ? ?Review of Systems ? ?   ?Objective:  ? Physical Exam ? ?Right knee more tender than the left knee ligaments are stable.  No swelling noted.  Calves are normal. ? ?Testicular exam normal.  Small epididymal cyst ?Doubt that this is going to cause a major issue but patient would like to see urology. ?   ?Assessment & Plan:  ?Epididymis cyst-patient is desiring to see urology to get their opinion on this.  I doubt that they would do surgery.  No sign of infection currently ? ?Bilateral knee pain referral to orthopedic surgery she prefers Dr. Aline Brochure possibility of joint injections. ? ?

## 2021-11-10 ENCOUNTER — Encounter: Payer: Self-pay | Admitting: *Deleted

## 2021-11-17 ENCOUNTER — Encounter: Payer: Self-pay | Admitting: Urology

## 2021-11-17 ENCOUNTER — Ambulatory Visit: Payer: Medicaid Other | Admitting: Urology

## 2021-11-17 VITALS — BP 137/79 | HR 70 | Ht 66.0 in | Wt 193.0 lb

## 2021-11-17 DIAGNOSIS — N451 Epididymitis: Secondary | ICD-10-CM | POA: Diagnosis not present

## 2021-11-17 LAB — URINALYSIS, ROUTINE W REFLEX MICROSCOPIC
Bilirubin, UA: NEGATIVE
Glucose, UA: NEGATIVE
Ketones, UA: NEGATIVE
Leukocytes,UA: NEGATIVE
Nitrite, UA: NEGATIVE
Protein,UA: NEGATIVE
RBC, UA: NEGATIVE
Specific Gravity, UA: 1.025 (ref 1.005–1.030)
Urobilinogen, Ur: 0.2 mg/dL (ref 0.2–1.0)
pH, UA: 7 (ref 5.0–7.5)

## 2021-11-17 MED ORDER — DOXYCYCLINE HYCLATE 100 MG PO CAPS: 100.0000 mg | ORAL_CAPSULE | Freq: Two times a day (BID) | ORAL | 0 refills | Status: AC

## 2021-11-17 NOTE — Progress Notes (Signed)
? ?Assessment: ?1. Epididymitis, left   ? ? ?Plan: ?I personally reviewed the scrotal ultrasound from 10/25/2021.  The study shows normal testes bilaterally, a very small left epididymal cyst, small left hydrocele and left varicocele.  I discussed these findings with the patient in detail today.  His exam is consistent with epididymitis.  I discussed this diagnosis and the benign nature of this diagnosis.  I reassured him that there is no evidence of testicular cancer. ?Doxycycline 100 mg twice daily x2 weeks. ?Continue Naprosyn. ?Return to office in 1 month. ? ?Chief Complaint:  ?Chief Complaint  ?Patient presents with  ? scrotal mass  ? ? ?History of Present Illness: ? ?[name] is a 31 y.o. year old male who is seen in consultation from Kathyrn Drown, MD for evaluation of scrotal mass.  He noted a mass in the left scrotum approximately 2 months ago on self-examination.  He then noted a fairly constant discomfort in the left scrotum during the past month.  The previously noted mass is uncomfortable to palpate.  He did not experience any significant scrotal swelling or redness.  No history of scrotal trauma. ?Scrotal ultrasound from 10/25/2021 showed normal testes bilaterally with normal blood flow, 2 mm left epididymal cyst, small left hydrocele, and mild left varicocele. ?No lower urinary tract symptoms.  No dysuria or gross hematuria. ? ? ?Past Medical History:  ?Past Medical History:  ?Diagnosis Date  ? ADHD (attention deficit hyperactivity disorder)   ? no current med.  ? Asthma   ? prn inhaler  ? Depression   ? Migraines   ? 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 right  ? OCD (obsessive compulsive disorder)   ? Oppositional defiant disorder   ? PTSD (post-traumatic stress disorder)   ? Seizures ([place])   ? Stuffy nose 07/01/2017  ? ? ?Past Surgical History:  ?Past Surgical History:  ?Procedure Laterality Date  ? GANGLION CYST EXCISION Right 07/03/2017  ? Procedure: RIGHT HAND NEUROMA REMOVAL;  Surgeon: Leandrew Koyanagi, MD;  Location: [facility];  Service: Orthopedics;  Laterality: Right;  ? neuroma of hand removed    ? TENDON LENGTHENING Bilateral   ? as a child  ? TOOTH EXTRACTION    ? ? ?Allergies:  ?Allergies  ?Allergen Reactions  ? Strattera [Atomoxetine Hcl] Other (See Comments)  ?  CAUSED AGGRESSION  ? Sulfa Antibiotics Hives  ? Guava Flavor   ? ? ?Family History:  ?Family History  ?Problem Relation Age of Onset  ? Diabetes Mother   ? Heart attack Father   ? Cancer Father   ? Alcohol abuse Paternal Aunt   ? Drug abuse Paternal Aunt   ? Bipolar disorder Cousin   ? ? ?Social History:  ?Social History  ? ?Tobacco Use  ? Smoking status: Every Day  ?  Packs/day: 0.01  ?  Types: Cigarettes  ? Smokeless tobacco: Former  ?  Types: Chew  ?  Quit date: 02/12/2016  ?Vaping Use  ? Vaping Use: Never used  ?Substance Use Topics  ? Alcohol use: Yes  ?  Comment: occasional  ? Drug use: No  ? ? ?Review of symptoms:  ?Constitutional:  Negative for unexplained weight loss, night sweats, fever, chills ?ENT:  Negative for nose bleeds, sinus pain, painful swallowing ?CV:  Negative for chest pain, shortness of breath, exercise intolerance, palpitations, loss of consciousness ?Resp:  Negative for cough, wheezing, shortness of breath ?GI:  Negative for nausea, vomiting, diarrhea, bloody stools ?  GU:  Positives noted in HPI; otherwise negative for gross hematuria, dysuria, urinary incontinence ?Neuro:  Negative for seizures, poor balance, limb weakness, slurred speech ?Psych:  Negative for lack of energy, depression, anxiety ?Endocrine:  Negative for polydipsia, polyuria, symptoms of hypoglycemia (dizziness, hunger, sweating) ?Hematologic:  Negative for anemia, purpura, petechia, prolonged or excessive bleeding, use of anticoagulants  ?Allergic:  Negative for difficulty breathing or choking as a result of exposure to anything; no shellfish allergy; no allergic response (rash/itch) to materials, foods ? ?Physical exam: ?BP 137/79    Pulse 70   Ht '5\' 6"'$  (1.676 m)   Wt 193 lb (87.5 kg)   BMI 31.15 kg/m?  ?GENERAL APPEARANCE:  Well appearing, well developed, well nourished, NAD ?HEENT: Atraumatic, Normocephalic, oropharynx clear. ?NECK: Supple without lymphadenopathy or thyromegaly. ?LUNGS: Clear to auscultation bilaterally. ?HEART: Regular Rate and Rhythm without murmurs, gallops, or rubs. ?ABDOMEN: Soft, non-tender, No Masses. ?EXTREMITIES: Moves all extremities well.  Without clubbing, cyanosis, or edema. ?NEUROLOGIC:  Alert and oriented x 3, normal gait, CN II-XII grossly intact.  ?MENTAL STATUS:  Appropriate. ?BACK:  Non-tender to palpation.  No CVAT ?SKIN:  Warm, dry and intact.   ?GU: ?Penis:  circumcised ?Meatus: Normal ?Scrotum: No edema or erythema; normal cords bilaterally ?Testis: normal without masses bilateral ?Epididymis: Right, normal; left with some induration and tenderness inferiorly ? ? ?Results: ?U/A dipstick negative ? ?

## 2021-11-20 ENCOUNTER — Encounter: Payer: Medicaid Other | Attending: Psychology | Admitting: Psychology

## 2021-11-20 DIAGNOSIS — G4733 Obstructive sleep apnea (adult) (pediatric): Secondary | ICD-10-CM | POA: Insufficient documentation

## 2021-11-20 DIAGNOSIS — F39 Unspecified mood [affective] disorder: Secondary | ICD-10-CM | POA: Insufficient documentation

## 2021-11-20 DIAGNOSIS — F422 Mixed obsessional thoughts and acts: Secondary | ICD-10-CM | POA: Insufficient documentation

## 2021-11-20 DIAGNOSIS — Z9989 Dependence on other enabling machines and devices: Secondary | ICD-10-CM | POA: Diagnosis not present

## 2021-11-21 ENCOUNTER — Encounter: Payer: Self-pay | Admitting: Psychology
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
## 2021-11-22 ENCOUNTER — Ambulatory Visit: Payer: Medicaid Other

## 2021-11-22 ENCOUNTER — Other Ambulatory Visit: Payer: Self-pay | Admitting: Orthopedic Surgery

## 2021-11-22 ENCOUNTER — Ambulatory Visit: Payer: Medicaid Other | Admitting: Orthopedic Surgery

## 2021-11-22 ENCOUNTER — Encounter: Payer: Self-pay | Admitting: Orthopedic Surgery

## 2021-11-22 VITALS — BP 125/85 | HR 77 | Ht 66.0 in | Wt 195.0 lb

## 2021-11-22 DIAGNOSIS — M2241 Chondromalacia patellae, right knee: Secondary | ICD-10-CM

## 2021-11-22 DIAGNOSIS — M2242 Chondromalacia patellae, left knee: Secondary | ICD-10-CM | POA: Diagnosis not present

## 2021-11-22 DIAGNOSIS — M25562 Pain in left knee: Secondary | ICD-10-CM

## 2021-11-22 NOTE — Patient Instructions (Signed)
Wear the brace for activity and work ? ?Do the exercises for 6 weeks ? ?Because you are on naproxen we cannot give you any other medicine so let me know when you are off of that and then we can start something else ? ?In the meantime you can take 500 mg of Tylenol every 6 hours ? ? ?

## 2021-11-22 NOTE — Progress Notes (Addendum)
NEW PROBLEM//OFFICE VISIT ? ? ?Chief Complaint  ?Patient presents with  ? Knee Pain  ?  Left knee- No injury, giving way, causing falls   ? ?31 year old male presents with bilateral knee pain left greater than right no history of trauma.  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 ? ? ? ?ROS: normal  ?Review of Systems  ?All other systems reviewed and are negative. ? ? ?BP 125/85   Pulse 77   Ht '5\' 6"'$  (1.676 m)   Wt 195 lb (88.5 kg)   BMI 31.47 kg/m?  ? ?Body mass index is 31.47 kg/m?. ? ?General appearance: Well-developed well-nourished no gross deformities ? ?Cardiovascular normal pulse and perfusion normal color without edema ? ?Neurologically no sensation loss or deficits or pathologic reflexes ? ?Psychological: Awake alert and oriented x3 mood and affect normal ? ?Skin no lacerations or ulcerations no nodularity no palpable masses, no erythema or nodularity ? ?Musculoskeletal:  ? ?Right and left knee full extension full flexion crepitance on range of motion patellofemoral joint cruciate and collateral ligaments stable no tenderness on the joint lines quadriceps contraction test positive ? ? ? ? ? ?Past Medical History:  ?Diagnosis Date  ? ADHD (attention deficit hyperactivity disorder)   ? no current med.  ? Asthma   ? prn inhaler  ? Depression   ? Migraines   ? 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 right  ? OCD (obsessive compulsive disorder)   ? Oppositional defiant disorder   ? PTSD (post-traumatic stress disorder)   ? Seizures ([place])   ? Stuffy nose 07/01/2017  ? ? ?Past Surgical History:  ?Procedure Laterality Date  ? GANGLION CYST EXCISION Right 07/03/2017  ? Procedure: RIGHT HAND NEUROMA REMOVAL;  Surgeon: Leandrew Koyanagi, MD;  Location: [facility];   Service: Orthopedics;  Laterality: Right;  ? neuroma of hand removed    ? TENDON LENGTHENING Bilateral   ? as a child  ? TOOTH EXTRACTION    ? ? ?Family History  ?Problem Relation Age of Onset  ? Diabetes Mother   ? Heart attack Father   ? Cancer Father   ? Alcohol abuse Paternal Aunt   ? Drug abuse Paternal Aunt   ? Bipolar disorder Cousin   ? ?Social History  ? ?Tobacco Use  ? Smoking status: Every Day  ?  Packs/day: 0.01  ?  Types: Cigarettes  ? Smokeless tobacco: Former  ?  Types: Chew  ?  Quit date: 02/12/2016  ?Vaping Use  ? Vaping Use: Never used  ?Substance Use Topics  ? Alcohol use: Yes  ?  Comment: occasional  ? Drug use: No  ? ? ?Allergies  ?Allergen Reactions  ? Strattera [Atomoxetine Hcl] Other (See Comments)  ?  CAUSED AGGRESSION  ? Sulfa Antibiotics Hives  ? Guava Flavor   ? ? ?Current Meds  ?Medication Sig  ? albuterol (VENTOLIN HFA) 108 (90 Base) MCG/ACT inhaler Inhale 1-2 puffs into the lungs every 6 (six) hours as needed for wheezing or shortness of breath.  ? doxycycline (VIBRAMYCIN) 100 MG capsule Take 1 capsule (100 mg total) by mouth every 12 (twelve) hours for 14 days.  ? fluticasone (FLONASE) 50 MCG/ACT nasal spray  Place into both nostrils.  ? ibuprofen (ADVIL) 600 MG tablet Take 600 mg by mouth every 6 (six) hours as needed.  ? loratadine (CLARITIN) 10 MG tablet Take 1 tablet (10 mg total) by mouth daily.  ? naproxen (NAPROSYN) 500 MG tablet One two times a day as needed for knee pain  ? ? ? ?MEDICAL DECISION MAKING ? ?A.  ?Encounter Diagnoses  ?Name Primary?  ? Left knee pain, unspecified chronicity Yes  ? Patella, chondromalacia, left   ? Chondromalacia patellae, right knee   ? ? ?B. DATA ANALYSED: ? ? ?IMAGING: ?Interpretation of images: I have personally reviewed the images and my interpretation is he had some images taken left knee in February but I took a standing films and axial view of the patella everything looks normal ?Orders: None ? ?Outside records reviewed: None ? ? ?C.  MANAGEMENT  ? ?Nonoperative management ? ?J brace patellofemoral stabilizer ? ?Continue naproxen and when he is off of that we can change him over to something else ?Use Tylenol 500 mg every 6 ? ?Home exercise for quadricep strengthening ? ?Return 8 weeks ? ?No orders of the defined types were placed in this encounter. ? ? ? ?Arther Abbott, MD ? ?11/22/2021 ?11:06 AM ?

## 2021-12-14 ENCOUNTER — Ambulatory Visit: Payer: Medicaid Other | Admitting: Urology

## 2021-12-14 ENCOUNTER — Encounter: Payer: Self-pay | Admitting: Urology

## 2021-12-14 VITALS — BP 128/78 | HR 74

## 2021-12-14 DIAGNOSIS — N451 Epididymitis: Secondary | ICD-10-CM

## 2021-12-14 NOTE — Progress Notes (Signed)
? ?Assessment: ?1. Epididymitis, left   ? ?Plan: ?Return to office as needed. ? ?Chief Complaint:  ?Chief Complaint  ?Patient presents with  ? Epididymitis  ? ? ?History of Present Illness: ? ?[name] is a 31 y.o. year old male who is seen for further evaluation of left epididymitis.  He was initially seen in March 2023 for evaluation of a left scrotal mass.  He noted a mass in the left scrotum approximately 2 months prior on self-examination.  He then noted a fairly constant discomfort in the left scrotum during the past month.  The previously noted mass was uncomfortable to palpation.  He did not experience any significant scrotal swelling or redness.  No history of scrotal trauma. ?Scrotal ultrasound from 10/25/2021 showed normal testes bilaterally with normal blood flow, 2 mm left epididymal cyst, small left hydrocele, and mild left varicocele. ?No lower urinary tract symptoms.  No dysuria or gross hematuria. ?His exam was consistent with epididymitis.  He was treated with doxycycline x2 weeks and Naprosyn. ? ?He returns today for follow-up. ?He has completed his antibiotics and anti-inflammatories.  He reports complete resolution of the left scrotal discomfort.  No scrotal swelling or erythema.  No lower urinary tract symptoms. ? ?Portions of the above documentation were copied from a prior visit for review purposes only. ? ? ?Past Medical History:  ?Past Medical History:  ?Diagnosis Date  ? ADHD (attention deficit hyperactivity disorder)   ? no current med.  ? Asthma   ? prn inhaler  ? Depression   ? Migraines   ? 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 right  ? OCD (obsessive compulsive disorder)   ? Oppositional defiant disorder   ? PTSD (post-traumatic stress disorder)   ? Seizures ([place])   ? Stuffy nose 07/01/2017  ? ? ?Past Surgical History:  ?Past Surgical History:  ?Procedure Laterality Date  ? GANGLION CYST EXCISION Right 07/03/2017  ? Procedure: RIGHT HAND NEUROMA REMOVAL;  Surgeon: Leandrew Koyanagi, MD;   Location: [facility];  Service: Orthopedics;  Laterality: Right;  ? neuroma of hand removed    ? TENDON LENGTHENING Bilateral   ? as a child  ? TOOTH EXTRACTION    ? ? ?Allergies:  ?Allergies  ?Allergen Reactions  ? Strattera [Atomoxetine Hcl] Other (See Comments)  ?  CAUSED AGGRESSION  ? Sulfa Antibiotics Hives  ? Guava Flavor   ? ? ?Family History:  ?Family History  ?Problem Relation Age of Onset  ? Diabetes Mother   ? Heart attack Father   ? Cancer Father   ? Alcohol abuse Paternal Aunt   ? Drug abuse Paternal Aunt   ? Bipolar disorder Cousin   ? ? ?Social History:  ?Social History  ? ?Tobacco Use  ? Smoking status: Every Day  ?  Packs/day: 0.01  ?  Types: Cigarettes  ? Smokeless tobacco: Former  ?  Types: Chew  ?  Quit date: 02/12/2016  ?Vaping Use  ? Vaping Use: Never used  ?Substance Use Topics  ? Alcohol use: Yes  ?  Comment: occasional  ? Drug use: No  ? ? ?ROS: ?Constitutional:  Negative for fever, chills, weight loss ?CV: Negative for chest pain, previous MI, hypertension ?Respiratory:  Negative for shortness of breath, wheezing, sleep apnea, frequent cough ?GI:  Negative for nausea, vomiting, bloody stool, GERD ? ?Physical exam: ?BP 128/78   Pulse 74  ?GENERAL APPEARANCE:  Well appearing, well developed, well nourished, NAD ?HEENT:  Atraumatic, normocephalic, oropharynx  clear ?NECK:  Supple without lymphadenopathy or thyromegaly ?ABDOMEN:  Soft, non-tender, no masses ?EXTREMITIES:  Moves all extremities well, without clubbing, cyanosis, or edema ?NEUROLOGIC:  Alert and oriented x 3, normal gait, CN II-XII grossly intact ?MENTAL STATUS:  appropriate ?BACK:  Non-tender to palpation, No CVAT ?SKIN:  Warm, dry, and intact ?GU: ?Scrotum: normal, no masses ?Testis: normal without masses bilateral ?Epididymis: normal ? ? ?Results: ?U/A dipstick negative ? ?

## 2021-12-15 LAB — URINALYSIS, ROUTINE W REFLEX MICROSCOPIC
Bilirubin, UA: NEGATIVE
Glucose, UA: NEGATIVE
Ketones, UA: NEGATIVE
Leukocytes,UA: NEGATIVE
Nitrite, UA: NEGATIVE
Protein,UA: NEGATIVE
RBC, UA: NEGATIVE
Specific Gravity, UA: >1.03 — ABNORMAL HIGH (ref 1.005–1.030)
Urobilinogen, Ur: 0.2 mg/dL (ref 0.2–1.0)
pH, UA: 6 (ref 5.0–7.5)

## 2021-12-27 ENCOUNTER — Encounter: Payer: Self-pay | Admitting: Family Medicine

## 2021-12-27 ENCOUNTER — Ambulatory Visit: Payer: Medicaid Other | Admitting: Family Medicine

## 2021-12-27 VITALS — BP 145/89 | HR 71 | Temp 98.2°F | Wt 190.6 lb

## 2021-12-27 DIAGNOSIS — R1033 Periumbilical pain: Secondary | ICD-10-CM | POA: Diagnosis not present

## 2021-12-27 MED ORDER — DIPHENOXYLATE-ATROPINE 2.5-0.025 MG PO TABS: 1.0000 | ORAL_TABLET | Freq: Four times a day (QID) | ORAL | 0 refills | Status: DC | PRN

## 2021-12-27 NOTE — Assessment & Plan Note (Addendum)
Presenting with periumbilical abdominal pain and associated diarrhea.  Gastroenteritis (viral) versus foodborne illness.  His exam is benign.  Labs for further evaluation today.  Lomotil as needed.  Needs to stay hydrated. ?

## 2021-12-27 NOTE — Patient Instructions (Signed)
Lots of fluids. ? ?Medication as prescribed. ? ?Labs today. ? ?Call with concerns. ? ?Take care ? ?Dr. Lacinda Axon  ?

## 2021-12-27 NOTE — Progress Notes (Signed)
? ?Subjective:  ?Patient ID: [name], male    DOB: 02-17-1991  Age: 31 y.o. MRN: [id number] ? ?CC: ?Chief Complaint  ?Patient presents with  ? Abdominal Pain  ?  Pt having lower abdominal pain and diarrhea. Pt states his BM has been green(not very dark but not light). Pt was congested on Sunday with cough. Pt went to [place] and only ate chicken biscuit and has just now been able to get back to eating habits. Abdominal pain in umbilical area.  ? ? ?HPI: ? ?31 year old male presents for evaluation of the above. ? ?Patient reports that his stool recently changed color and was green.  He states that he recently traveled to [place] and his diet has been off since that period of time.  He has been slowly returning to his normal diet.  Last night he developed periumbilical abdominal pain and associated diarrhea.  No nausea vomiting.  No fever.  He states that he was unable to stay at work today due to his discomfort and diarrhea.  He has not had any loose stools since 10:00 this morning.  He has eaten a muffin today.  He has not eaten anything else. ? ?Patient Active Problem List  ? Diagnosis Date Noted  ? Periumbilical abdominal pain 12/27/2021  ? Epididymitis, left 11/17/2021  ? OSA on CPAP 10/15/2018  ? Hyperlipidemia 01/31/2018  ? Sleep disorder, circadian, delayed sleep phase type 11/11/2017  ? Mild intermittent asthma 11/11/2017  ? Neuroma of hand 06/24/2017  ? Snoring 12/08/2015  ? OCD (obsessive compulsive disorder) 08/06/2012  ? Unspecified episodic mood disorder 07/18/2011  ? ADHD (attention deficit hyperactivity disorder), combined type 07/18/2011  ? ? ?Social Hx   ?Social History  ? ?Socioeconomic History  ? Marital status: Divorced  ?  Spouse name: Not on file  ? Number of children: Not on file  ? Years of education: Not on file  ? Highest education level: Not on file  ?Occupational History  ? Not on file  ?Tobacco Use  ? Smoking status: Every Day  ?  Packs/day: 0.01  ?  Types: Cigarettes  ?  Smokeless tobacco: Former  ?  Types: Chew  ?  Quit date: 02/12/2016  ?Vaping Use  ? Vaping Use: Never used  ?Substance and Sexual Activity  ? Alcohol use: Yes  ?  Comment: occasional  ? Drug use: No  ? Sexual activity: Yes  ?  Partners: Female  ?Other Topics Concern  ? Not on file  ?Social History Narrative  ? Not on file  ? ?Social Determinants of Health  ? ?Financial Resource Strain: Not on file  ?Food Insecurity: Not on file  ?Transportation Needs: Not on file  ?Physical Activity: Not on file  ?Stress: Not on file  ?Social Connections: Not on file  ? ? ?Review of Systems ?Per HPI ? ?Objective:  ?BP (!) 145/89   Pulse 71   Temp 98.2 ?F (36.8 ?C)   Wt 190 lb 9.6 oz (86.5 kg)   SpO2 100%   BMI 30.76 kg/m?  ? ? ?  12/27/2021  ?  1:30 PM 12/14/2021  ?  3:29 PM 11/22/2021  ?  9:49 AM  ?BP/Weight  ?Systolic BP 267 124 580  ?Diastolic BP 89 78 85  ?Wt. (Lbs) 190.6  195  ?BMI 30.76 kg/m2  31.47 kg/m2  ? ? ?Physical Exam ?Vitals and nursing note reviewed.  ?Constitutional:   ?   General: He is not in acute distress. ?   Appearance:  Normal appearance. He is not ill-appearing.  ?Eyes:  ?   General:     ?   Right eye: No discharge.     ?   Left eye: No discharge.  ?   Conjunctiva/sclera: Conjunctivae normal.  ?Cardiovascular:  ?   Rate and Rhythm: Normal rate and regular rhythm.  ?Pulmonary:  ?   Effort: Pulmonary effort is normal.  ?   Breath sounds: Normal breath sounds. No wheezing, rhonchi or rales.  ?Abdominal:  ?   General: There is no distension.  ?   Palpations: Abdomen is soft.  ?   Tenderness: There is no abdominal tenderness.  ?Neurological:  ?   Mental Status: He is alert.  ?Psychiatric:     ?   Mood and Affect: Mood normal.     ?   Behavior: Behavior normal.  ? ? ?Lab Results  ?Component Value Date  ? WBC 7.4 10/03/2021  ? HGB 16.9 10/03/2021  ? HCT 50.1 10/03/2021  ? PLT 274 10/03/2021  ? GLUCOSE 91 01/31/2018  ? CHOL 220 (H) 01/31/2018  ? TRIG 183 (H) 01/31/2018  ? HDL 39 (L) 01/31/2018  ? LDLCALC 144 (H)  01/31/2018  ? ALT 28 01/31/2018  ? AST 16 01/31/2018  ? NA 144 01/31/2018  ? K 5.1 01/31/2018  ? CL 105 01/31/2018  ? CREATININE 0.74 (L) 01/31/2018  ? BUN 11 01/31/2018  ? CO2 21 01/31/2018  ? TSH 0.774 08/01/2017  ? HGBA1C 5.3 01/31/2018  ? ? ? ?Assessment & Plan:  ? ?Problem List Items Addressed This Visit   ? ?  ? Other  ? Periumbilical abdominal pain - Primary  ?  Presenting with periumbilical abdominal pain and associated diarrhea.  Gastroenteritis (viral) versus foodborne illness.  His exam is benign.  Labs for further evaluation today.  Lomotil as needed.  Needs to stay hydrated. ? ?  ?  ? Relevant Orders  ? CBC  ? CMP14+EGFR  ? Lipase  ? ? ?Meds ordered this encounter  ?Medications  ? diphenoxylate-atropine (LOMOTIL) 2.5-0.025 MG tablet  ?  Sig: Take 1 tablet by mouth 4 (four) times daily as needed for diarrhea or loose stools.  ?  Dispense:  30 tablet  ?  Refill:  0  ? ?Thersa Salt DO ?[place] ? ?

## 2021-12-28 LAB — CMP14+EGFR
ALT: 34 IU/L (ref 0–44)
AST: 22 IU/L (ref 0–40)
Albumin/Globulin Ratio: 1.7 (ref 1.2–2.2)
Albumin: 5 g/dL (ref 4.1–5.2)
Alkaline Phosphatase: 67 IU/L (ref 44–121)
BUN/Creatinine Ratio: 21 — ABNORMAL HIGH (ref 9–20)
BUN: 15 mg/dL (ref 6–20)
Bilirubin Total: 0.2 mg/dL (ref 0.0–1.2)
CO2: 25 mmol/L (ref 20–29)
Calcium: 9.5 mg/dL (ref 8.7–10.2)
Chloride: 103 mmol/L (ref 96–106)
Creatinine, Ser: 0.73 mg/dL — ABNORMAL LOW (ref 0.76–1.27)
Globulin, Total: 2.9 g/dL (ref 1.5–4.5)
Glucose: 92 mg/dL (ref 70–99)
Potassium: 4.3 mmol/L (ref 3.5–5.2)
Sodium: 142 mmol/L (ref 134–144)
Total Protein: 7.9 g/dL (ref 6.0–8.5)
eGFR: 126 mL/min/{1.73_m2} (ref >59)

## 2021-12-28 LAB — CBC
Hematocrit: 48.1 % (ref 37.5–51.0)
Hemoglobin: 16.6 g/dL (ref 13.0–17.7)
MCH: 30.3 pg (ref 26.6–33.0)
MCHC: 34.5 g/dL (ref 31.5–35.7)
MCV: 88 fL (ref 79–97)
RBC: 5.48 x10E6/uL (ref 4.14–5.80)
RDW: 12.8 % (ref 11.6–15.4)
WBC: 6.5 10*3/uL (ref 3.4–10.8)

## 2021-12-28 LAB — LIPASE: Lipase: 74 U/L (ref 13–78)

## 2021-12-29 NOTE — Progress Notes (Signed)
Patient notified and verbalized understanding. 

## 2021-12-31 ENCOUNTER — Ambulatory Visit
Admission: EM | Admit: 2021-12-31 | Discharge: 2021-12-31 | Disposition: A | Discharge status: Home / Self Care | Payer: Medicaid Other | Attending: Internal Medicine | Admitting: Internal Medicine

## 2021-12-31 ENCOUNTER — Encounter: Payer: Self-pay | Admitting: Emergency Medicine

## 2021-12-31 DIAGNOSIS — K2900 Acute gastritis without bleeding: Secondary | ICD-10-CM | POA: Diagnosis not present

## 2021-12-31 MED ORDER — LIDOCAINE VISCOUS HCL 2 % MT SOLN
15.0000 mL | Freq: Once | OROMUCOSAL | Status: AC
  Administered 2021-12-31: 15 mL via ORAL

## 2021-12-31 MED ORDER — ALUM & MAG HYDROXIDE-SIMETH 200-200-20 MG/5ML PO SUSP
30.0000 mL | Freq: Once | ORAL | Status: AC
  Administered 2021-12-31: 30 mL via ORAL

## 2021-12-31 MED ORDER — SUCRALFATE 1 GM/10ML PO SUSP: 1.0000 g | Freq: Three times a day (TID) | ORAL | 0 refills | Status: DC | PRN

## 2021-12-31 MED ORDER — OMEPRAZOLE 20 MG PO CPDR: 20.0000 mg | DELAYED_RELEASE_CAPSULE | Freq: Every day | ORAL | 1 refills | Status: DC

## 2021-12-31 NOTE — ED Triage Notes (Signed)
Lower ABD pain x 1 week.  States the more he eats, the less it hurts.  Was seen by PCP and was told it was a virus.  Denies diarrhea and nausea.  Has been taking motrin and tylenol with out relief.  States he "tired weed" to help with pain without success. ?

## 2021-12-31 NOTE — ED Provider Notes (Signed)
?[facility] ? ? ? ?CSN: [id number] ?Arrival date & time: 12/31/21  7867 ? ? ?  ? ?History   ?Chief Complaint ?No chief complaint on file. ? ? ?HPI ?[name] is a 31 y.o. male comes to the urgent care with periumbilical abdominal pain of 1 week duration.  Patient says the pain has been persistent.  It is aggravated by hunger and relieved by food.  Patient says the pain is relieved by food and after about 2 hours of eating the pain returns.  Pain is currently 6 out of 10.  No radiation of pain.  No known relieving factors.  He has tried ibuprofen and Tylenol with no relief.  He smokes some marijuana with the hope of getting some pain relief but that did not work as well.  No trauma to the abdomen.  No fever or chills.  Patient recently recovered from what sounds like an acute viral gastroenteritis.  No vomiting.  Patient has occasional nausea.  He denies any blood in stool or melanotic stool at this time.  No abdominal distention.  ? ?HPI ? ?Past Medical History:  ?Diagnosis Date  ? ADHD (attention deficit hyperactivity disorder)   ? no current med.  ? Asthma   ? prn inhaler  ? Depression   ? Migraines   ? 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 right  ? OCD (obsessive compulsive disorder)   ? Oppositional defiant disorder   ? PTSD (post-traumatic stress disorder)   ? Seizures ([place])   ? Stuffy nose 07/01/2017  ? ? ?Patient Active Problem List  ? Diagnosis Date Noted  ? Periumbilical abdominal pain 12/27/2021  ? Epididymitis, left 11/17/2021  ? OSA on CPAP 10/15/2018  ? Hyperlipidemia 01/31/2018  ? Sleep disorder, circadian, delayed sleep phase type 11/11/2017  ? Mild intermittent asthma 11/11/2017  ? Neuroma of hand 06/24/2017  ? Snoring 12/08/2015  ? OCD (obsessive compulsive disorder) 08/06/2012  ? Unspecified episodic mood disorder 07/18/2011  ? ADHD (attention deficit hyperactivity disorder), combined type 07/18/2011  ? ? ?Past Surgical History:  ?Procedure Laterality Date  ? GANGLION CYST EXCISION Right  07/03/2017  ? Procedure: RIGHT HAND NEUROMA REMOVAL;  Surgeon: Leandrew Koyanagi, MD;  Location: [facility];  Service: Orthopedics;  Laterality: Right;  ? neuroma of hand removed    ? TENDON LENGTHENING Bilateral   ? as a child  ? TOOTH EXTRACTION    ? ? ? ? ? ?Home Medications   ? ?Prior to Admission medications   ?Medication Sig Start Date End Date Taking? Authorizing Provider  ?omeprazole (PRILOSEC) 20 MG capsule Take 1 capsule (20 mg total) by mouth daily. 12/31/21  Yes Anie Juniel, Myrene Galas, MD  ?sucralfate (CARAFATE) 1 GM/10ML suspension Take 10 mLs (1 g total) by mouth 3 (three) times daily as needed. 12/31/21  Yes Raylyn Carton, Myrene Galas, MD  ?albuterol (VENTOLIN HFA) 108 (90 Base) MCG/ACT inhaler Inhale 1-2 puffs into the lungs every 6 (six) hours as needed for wheezing or shortness of breath. 07/17/21   Volney American, PA-C  ?diphenoxylate-atropine (LOMOTIL) 2.5-0.025 MG tablet Take 1 tablet by mouth 4 (four) times daily as needed for diarrhea or loose stools. 12/27/21   Coral Spikes, DO  ?fluticasone (FLONASE) 50 MCG/ACT nasal spray Place into both nostrils. 11/02/21   [provider]  ?ibuprofen (ADVIL) 600 MG tablet Take 600 mg by mouth every 6 (six) hours as needed.    [provider]  ?loratadine (CLARITIN) 10 MG tablet  Take 1 tablet (10 mg total) by mouth daily. 11/09/19   Kathyrn Drown, MD  ?naproxen (NAPROSYN) 500 MG tablet One two times a day as needed for knee pain 11/07/21   Kathyrn Drown, MD  ? ? ?Family History ?Family History  ?Problem Relation Age of Onset  ? Diabetes Mother   ? Heart attack Father   ? Cancer Father   ? Alcohol abuse Paternal Aunt   ? Drug abuse Paternal Aunt   ? Bipolar disorder Cousin   ? ? ?Social History ?Social History  ? ?Tobacco Use  ? Smoking status: Every Day  ?  Packs/day: 0.01  ?  Types: Cigarettes  ? Smokeless tobacco: Former  ?  Types: Chew  ?  Quit date: 02/12/2016  ?Vaping Use  ? Vaping Use: Never used  ?Substance Use Topics  ? Alcohol  use: Yes  ?  Comment: occasional  ? Drug use: Yes  ?  Types: Marijuana  ? ? ? ?Allergies   ?Strattera [atomoxetine hcl], Sulfa antibiotics, and Guava flavor ? ? ?Review of Systems ?Review of Systems ?As per HPI ? ?Physical Exam ?Triage Vital Signs ?ED Triage Vitals  ?Enc Vitals Group  ?   BP 12/31/21 0849 (!) 148/89  ?   Pulse Rate 12/31/21 0849 94  ?   Resp 12/31/21 0849 (!) 24  ?   Temp 12/31/21 0849 (!) 97.5 ?F (36.4 ?C)  ?   Temp Source 12/31/21 0849 Oral  ?   SpO2 12/31/21 0849 99 %  ?   Weight --   ?   Height --   ?   Head Circumference --   ?   Peak Flow --   ?   Pain Score 12/31/21 0848 6  ?   Pain Loc --   ?   Pain Edu? --   ?   Excl. in [place]? --   ? ?No data found. ? ?Updated Vital Signs ?BP (!) 148/89 (BP Location: Right Arm)   Pulse 94   Temp (!) 97.5 ?F (36.4 ?C) (Oral)   Resp (!) 24   SpO2 99%  ? ?Visual Acuity ?Right Eye Distance:   ?Left Eye Distance:   ?Bilateral Distance:   ? ?Right Eye Near:   ?Left Eye Near:    ?Bilateral Near:    ? ?Physical Exam ?Vitals and nursing note reviewed.  ?Constitutional:   ?   General: He is not in acute distress. ?   Appearance: He is not ill-appearing.  ?Cardiovascular:  ?   Rate and Rhythm: Normal rate and regular rhythm.  ?   Pulses: Normal pulses.  ?   Heart sounds: Normal heart sounds.  ?Pulmonary:  ?   Effort: Pulmonary effort is normal.  ?   Breath sounds: Normal breath sounds.  ?Abdominal:  ?   General: Bowel sounds are normal.  ?   Palpations: Abdomen is soft.  ?   Tenderness: There is abdominal tenderness. There is no guarding or rebound.  ?Neurological:  ?   Mental Status: He is alert.  ? ? ? ?UC Treatments / Results  ?Labs ?(all labs ordered are listed, but only abnormal results are displayed) ?Labs Reviewed - No data to display ? ?EKG ? ? ?Radiology ?No results found. ? ?Procedures ?Procedures (including critical care time) ? ?Medications Ordered in UC ?Medications  ?alum & mag hydroxide-simeth (MAALOX/MYLANTA) 200-200-20 MG/5ML suspension 30 mL (30  mLs Oral Given 12/31/21 0918)  ?  And  ?lidocaine (XYLOCAINE) 2 % viscous  mouth solution 15 mL (15 mLs Oral Given 12/31/21 0918)  ? ? ?Initial Impression / Assessment and Plan / UC Course  ?I have reviewed the triage vital signs and the nursing notes. ? ?Pertinent labs & imaging results that were available during my care of the patient were reviewed by me and considered in my medical decision making (see chart for details). ? ?  ? ?1.  Acute superficial gastritis with no hemorrhage: ?GI cocktail resulted in significant improvement in the abdominal pain ?I suspect that the patient may have duodenitis or gastroduodenitis ?Prilosec 20 mg orally daily ?Carafate as needed before meals ?NSAID avoidance precautions given ?Maintain adequate hydration ?Return precautions given. ?Final Clinical Impressions(s) / UC Diagnoses  ? ?Final diagnoses:  ?Acute superficial gastritis without hemorrhage  ? ? ? ?Discharge Instructions   ? ?  ?Please take medications as prescribed ?Avoid ibuprofen, naproxen or other anti-inflammatory agents until your abdominal pain completely resolves ?Take your nausea medication as prescribed ?If you have worsening pain please return to urgent care to be reevaluated ? ? ?ED Prescriptions   ? ? Medication Sig Dispense Auth. Provider  ? sucralfate (CARAFATE) 1 GM/10ML suspension Take 10 mLs (1 g total) by mouth 3 (three) times daily as needed. 420 mL Shanty Ginty, Myrene Galas, MD  ? omeprazole (PRILOSEC) 20 MG capsule Take 1 capsule (20 mg total) by mouth daily. 30 capsule Armoni Kludt, Myrene Galas, MD  ? ?  ? ?PDMP not reviewed this encounter. ?  ?Chase Picket, MD ?12/31/21 1001 ? ?

## 2021-12-31 NOTE — Discharge Instructions (Signed)
Please take medications as prescribed ?Avoid ibuprofen, naproxen or other anti-inflammatory agents until your abdominal pain completely resolves ?Take your nausea medication as prescribed ?If you have worsening pain please return to urgent care to be reevaluated ?

## 2022-01-17 ENCOUNTER — Ambulatory Visit: Payer: Medicaid Other | Admitting: Orthopedic Surgery

## 2022-01-17 ENCOUNTER — Encounter: Payer: Self-pay | Admitting: Orthopedic Surgery

## 2022-01-17 DIAGNOSIS — M2242 Chondromalacia patellae, left knee: Secondary | ICD-10-CM

## 2022-01-17 NOTE — Progress Notes (Unsigned)
FOLLOW UP   Encounter Diagnosis  Name Primary?   Patella, chondromalacia, left Yes     Chief Complaint  Patient presents with   Knee Pain    Better/ brace on left knee irritates the back of his leg     I'm  better with the brace   Exam is normal   Continue brace and exercises   Fu prn

## 2022-01-17 NOTE — Patient Instructions (Signed)
Continue the exercises   Continue the brace   Follow up prn

## 2022-01-24 ENCOUNTER — Ambulatory Visit: Payer: Medicaid Other | Admitting: Neurology

## 2022-01-31 ENCOUNTER — Ambulatory Visit: Payer: Medicaid Other | Admitting: Neurology

## 2022-02-03 DIAGNOSIS — G4733 Obstructive sleep apnea (adult) (pediatric): Secondary | ICD-10-CM | POA: Diagnosis not present

## 2022-02-05 ENCOUNTER — Encounter: Payer: Self-pay | Admitting: Family Medicine

## 2022-02-26 ENCOUNTER — Encounter: Payer: Medicaid Other | Attending: Psychology | Admitting: Psychology

## 2022-02-26 DIAGNOSIS — G4733 Obstructive sleep apnea (adult) (pediatric): Secondary | ICD-10-CM | POA: Diagnosis not present

## 2022-02-26 DIAGNOSIS — F422 Mixed obsessional thoughts and acts: Secondary | ICD-10-CM | POA: Insufficient documentation

## 2022-02-26 DIAGNOSIS — F39 Unspecified mood [affective] disorder: Secondary | ICD-10-CM | POA: Insufficient documentation

## 2022-02-26 DIAGNOSIS — Z9989 Dependence on other enabling machines and devices: Secondary | ICD-10-CM | POA: Diagnosis not present

## 2022-03-06 ENCOUNTER — Encounter: Payer: Self-pay | Admitting: Psychology

## 2022-03-06 NOTE — Progress Notes (Signed)
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 had a deleterious effect on some other aspects of his life.  While the patient is maintaining the house he inherited after the death of his mother he is also spent most of the extra cash that he inherited and is distressed by that.  He got into some financial difficulties and got behind on his bills but is now steadily work to getting his bills caught up and is setting up structured patterns to keeping bills up-to-date.  The patient has acute onset of crying spells at times but they tend to be short-lived and he gets focused on other things and often uses thinking about cars and other aspects as a way to avoid thinking about the passing of his mother.  We have continue to work on therapeutic interventions around his depression and anxiety/OCD symptoms and ongoing attentional deficits.  11/20/2021: The patient reports that he had a medical scare recently when he developed a lump and pain in his testicle with pain radiating up through his groin area.  The patient immediately began fearful that he had testicular cancer.  The patient reluctantly went to his PCP who then referred him to a urologist.  It turns out that he had a small cyst that was blocking  proper function and an infection.  The patient talked about how it reminded him of his father developing multiple forms of cancer at the end of his father's life and the patient struggling with seeing his father diminished physically.  The patient reports that he began to obsess about fears of cancer and that he did not want to do chemo or radiation if he got cancer.  We addressed this issue specifically today and talked about how cancer comes in many forms and strategies for treatment are all the same.  The patient tends to lock into a particular idea and have difficulty shifting creating poor coping strategies.  Diagnosis:    Unspecified mood (affective) disorder (HCC)  Mixed obsessional thoughts and acts  OSA on CPAP         Electronically Signed   _______________________ Ilean Skill, Psy.D.

## 2022-03-13 ENCOUNTER — Telehealth: Payer: Self-pay | Admitting: Neurology

## 2022-03-13 NOTE — Telephone Encounter (Signed)
LVM and mychart msg informing pt of need to reschedule 8/10 appointment - MD out

## 2022-03-28 ENCOUNTER — Ambulatory Visit (INDEPENDENT_AMBULATORY_CARE_PROVIDER_SITE_OTHER): Payer: Medicaid Other | Admitting: Family Medicine

## 2022-03-28 ENCOUNTER — Encounter: Payer: Self-pay | Admitting: Family Medicine

## 2022-03-28 VITALS — BP 118/80 | HR 78 | Temp 98.8°F | Ht 66.0 in | Wt 192.0 lb

## 2022-03-28 DIAGNOSIS — E7849 Other hyperlipidemia: Secondary | ICD-10-CM | POA: Diagnosis not present

## 2022-03-28 DIAGNOSIS — Z1159 Encounter for screening for other viral diseases: Secondary | ICD-10-CM

## 2022-03-28 DIAGNOSIS — Z0000 Encounter for general adult medical examination without abnormal findings: Secondary | ICD-10-CM

## 2022-03-28 MED ORDER — IBUPROFEN 600 MG PO TABS: 600.0000 mg | ORAL_TABLET | Freq: Three times a day (TID) | ORAL | 3 refills | Status: DC | PRN

## 2022-03-28 MED ORDER — ALBUTEROL SULFATE HFA 108 (90 BASE) MCG/ACT IN AERS: 1.0000 | INHALATION_SPRAY | Freq: Four times a day (QID) | RESPIRATORY_TRACT | 3 refills | Status: DC | PRN

## 2022-03-28 MED ORDER — LORATADINE 10 MG PO TABS: 10.0000 mg | ORAL_TABLET | Freq: Every day | ORAL | 5 refills | Status: DC

## 2022-03-28 NOTE — Progress Notes (Signed)
   Subjective:    Patient ID: [name], male    DOB: [birth year], 31 y.o.   MRN: [id number]  HPI  The patient comes in today for a wellness visit. Here today for physical He states that he has done a good job staying away from smoking over the past couple months He was utilizing smoking before that but he is decided to quit He denies any current drug use states alcohol use is rare denies being depressed.  Works 2 different jobs.  Helps takes care of his kids.   A review of their health history was completed.  A review of medications was also completed.  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   Additional concerns: left knee pain and need a new brace   Review of Systems     Objective:   Physical Exam  General-in no acute distress Eyes-no discharge Lungs-respiratory rate normal, CTA CV-no murmurs,RRR Extremities skin warm dry no edema Neuro grossly normal Behavior normal, alert Stay exam not indicated      Assessment & Plan:  Adult wellness-complete.wellness physical was conducted today. Importance of diet and exercise were discussed in detail.  Importance of stress reduction and healthy living were discussed.  In addition to this a discussion regarding safety was also covered.  We also reviewed over immunizations and gave recommendations regarding current immunization needed for age.   In addition to this additional areas were also touched on including: Preventative health exams needed:  Colonoscopy not indicated Labs ordered Patient was advised yearly wellness exam Ibuprofen as needed for back pain Flonase Claritin as needed for reactive airway Healthy diet regular activity recommended

## 2022-03-29 ENCOUNTER — Ambulatory Visit: Payer: Medicaid Other | Admitting: Neurology

## 2022-03-29 LAB — LIPID PANEL
Chol/HDL Ratio: 6.1 ratio — ABNORMAL HIGH (ref 0.0–5.0)
Cholesterol, Total: 249 mg/dL — ABNORMAL HIGH (ref 100–199)
HDL: 41 mg/dL (ref >39)
LDL Chol Calc (NIH): 163 mg/dL — ABNORMAL HIGH (ref 0–99)
Triglycerides: 243 mg/dL — ABNORMAL HIGH (ref 0–149)
VLDL Cholesterol Cal: 45 mg/dL — ABNORMAL HIGH (ref 5–40)

## 2022-03-29 LAB — HEPATITIS C ANTIBODY: Hep C Virus Ab: NONREACTIVE

## 2022-04-24 ENCOUNTER — Encounter: Payer: Medicaid Other | Attending: Psychology | Admitting: Psychology

## 2022-04-24 DIAGNOSIS — G4733 Obstructive sleep apnea (adult) (pediatric): Secondary | ICD-10-CM | POA: Insufficient documentation

## 2022-04-24 DIAGNOSIS — F39 Unspecified mood [affective] disorder: Secondary | ICD-10-CM | POA: Diagnosis not present

## 2022-04-24 DIAGNOSIS — F422 Mixed obsessional thoughts and acts: Secondary | ICD-10-CM | POA: Diagnosis not present

## 2022-04-24 DIAGNOSIS — Z9989 Dependence on other enabling machines and devices: Secondary | ICD-10-CM | POA: Insufficient documentation

## 2022-05-08 ENCOUNTER — Encounter: Payer: Self-pay | Admitting: Psychology

## 2022-05-08 NOTE — Progress Notes (Signed)
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 breaking off the relationship.  Given the patient's history and how he handled the situation this is a significant improvement in the way he responded.  The patient identified the maladaptive behavior and the woman that he had been seen and was able to appropriately in the relationship without obsessional thinking or trying to justify or control the situation.  The patient simply let her know that he was breaking off the relationship and began moving forward in his life.  09/27/2021: I have not seen the patient for several months now and reports that he has continued to work effectively at his job but acknowledges some ongoing intrusive thoughts now that we are at the 1 year anniversary of the death of his mother.  She was very important in his life and encouraged him to make all of the progress that he has had.  The patient admits to ongoing obsessional thinking about cars even though this is his occupation now he spent most of his free time thinking about working around cars as well.  The patient admits that this is had a deleterious effect on some other aspects of his life.  While the patient is maintaining the house he inherited after the death of his mother he is  also spent most of the extra cash that he inherited and is distressed by that.  He got into some financial difficulties and got behind on his bills but is now steadily work to getting his bills caught up and is setting up structured patterns to keeping bills up-to-date.  The patient has acute onset of crying spells at times but they tend to be short-lived and he gets focused on other things and often uses thinking about cars and other aspects as a way to avoid thinking about the passing of his mother.  We have continue to work on therapeutic interventions around his depression and anxiety/OCD symptoms and ongoing attentional deficits.  11/20/2021: The patient reports that he had a medical scare recently when he developed a lump and pain in his testicle with pain radiating up through his groin area.  The patient immediately began fearful that he had testicular cancer.  The patient reluctantly went to his PCP who then referred him to a urologist.  It turns out that he had a small cyst that was blocking proper function and an infection.  The patient talked about how it reminded him of his father developing multiple forms of cancer at the end of his father's life and the patient struggling with seeing his father diminished physically.  The patient reports that he began to obsess about fears of cancer and that he did not want to do chemo or radiation if he got cancer.  We addressed this issue specifically today and talked about how cancer comes in many forms and strategies for treatment are all the same.  The patient tends to lock into a particular idea and have difficulty shifting creating poor coping strategies.  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  They get along for the most part and have discussed issues that happened previously.  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  The patient reports that he continues to have significant depression when thinking about his mother's death from [place] and relating it to how sick he was and the fact that he did not die.  The patient continues to have guilty even though none is deserved.  The patient continues to be the primary caregiver for his children and the primary financial provider for his children.  Depressive symptoms have been less intense recently and for the most part he is managing to continue to work effectively.  Diagnosis:    Unspecified mood (affective) disorder (HCC)  Mixed obsessional thoughts and acts  OSA on CPAP         Electronically Signed   _______________________ Ilean Skill, Psy.D.

## 2022-06-19 ENCOUNTER — Encounter: Payer: Medicaid Other | Attending: Psychology | Admitting: Psychology

## 2022-06-19 DIAGNOSIS — F422 Mixed obsessional thoughts and acts: Secondary | ICD-10-CM | POA: Diagnosis not present

## 2022-06-19 DIAGNOSIS — G4733 Obstructive sleep apnea (adult) (pediatric): Secondary | ICD-10-CM | POA: Diagnosis present

## 2022-06-19 DIAGNOSIS — F39 Unspecified mood [affective] disorder: Secondary | ICD-10-CM | POA: Diagnosis not present

## 2022-06-19 DIAGNOSIS — Z9989 Dependence on other enabling machines and devices: Secondary | ICD-10-CM | POA: Diagnosis present

## 2022-06-19 IMAGING — US US SCROTUM W/ DOPPLER COMPLETE
1 series · 14 of 25 positions shown · non-contrast
Comparison: None.

CLINICAL DATA: Left testicular pain for 1 month.

EXAM:
SCROTAL ULTRASOUND
DOPPLER ULTRASOUND OF THE TESTICLES
TECHNIQUE: Complete ultrasound examination of the testicles, epididymis, and
other scrotal structures was performed. Color and spectral Doppler
ultrasound were also utilized to evaluate blood flow to the
testicles.

[Series 1: us scrotum w/doppler · 96 acquisitions, 14 frames shown]
[im 1/96]
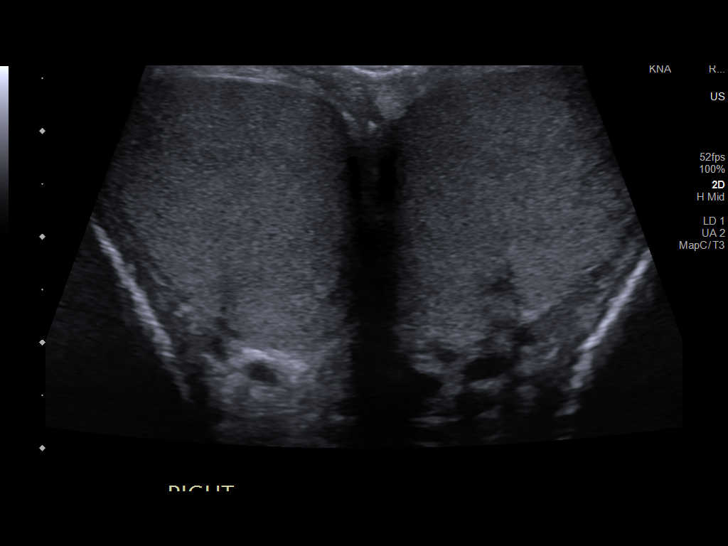
[im 8/96]
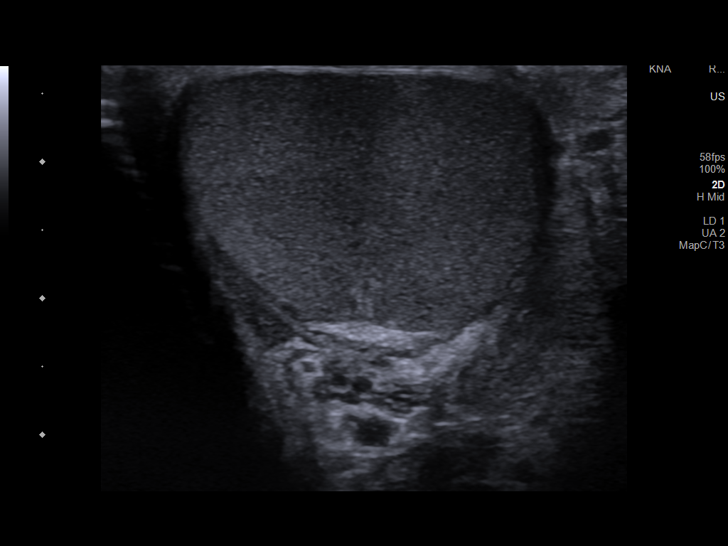
[im 16/96]
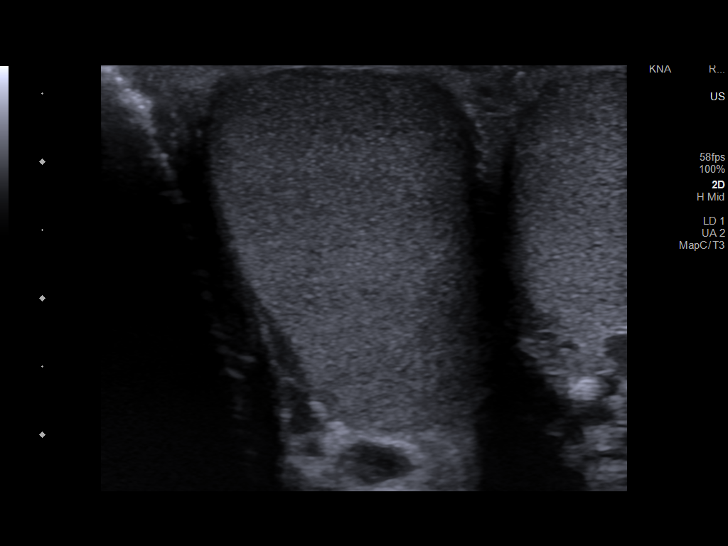
[im 24/96]
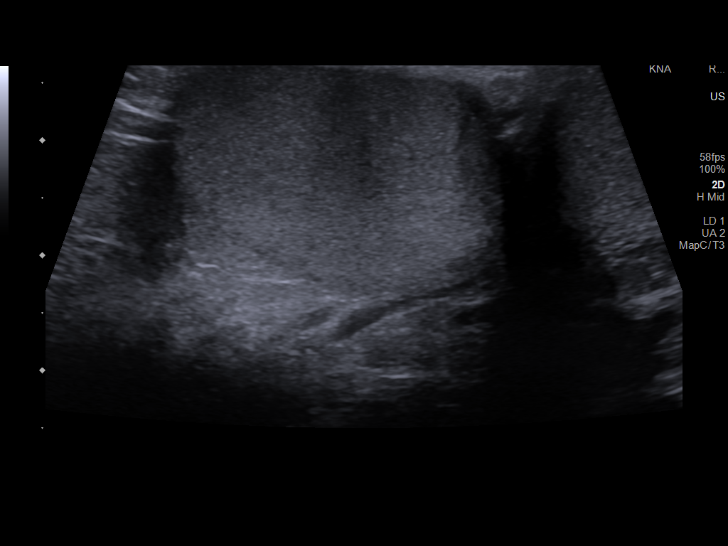
[im 32/96]
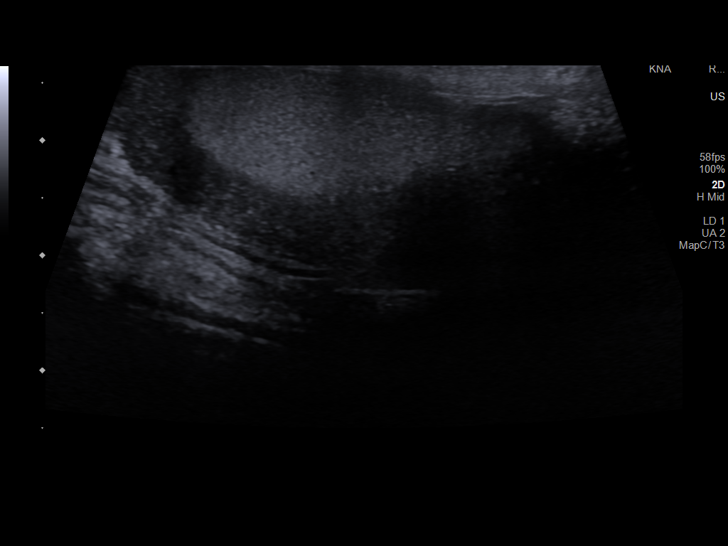
[im 36/96]
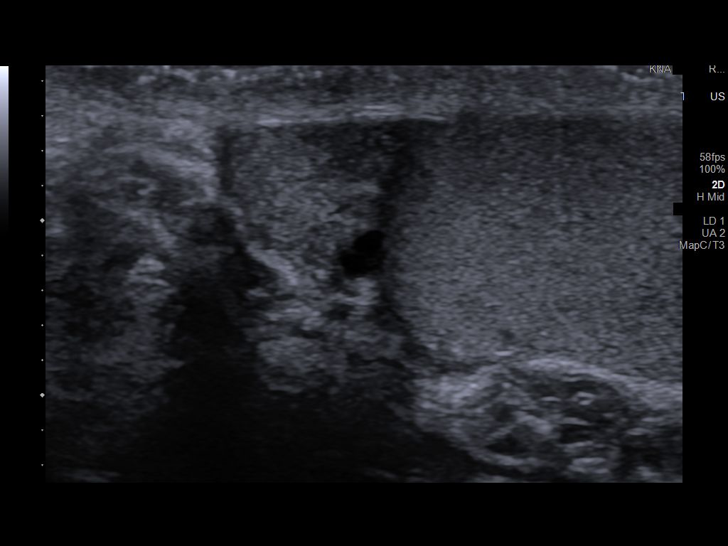
[im 44/96]
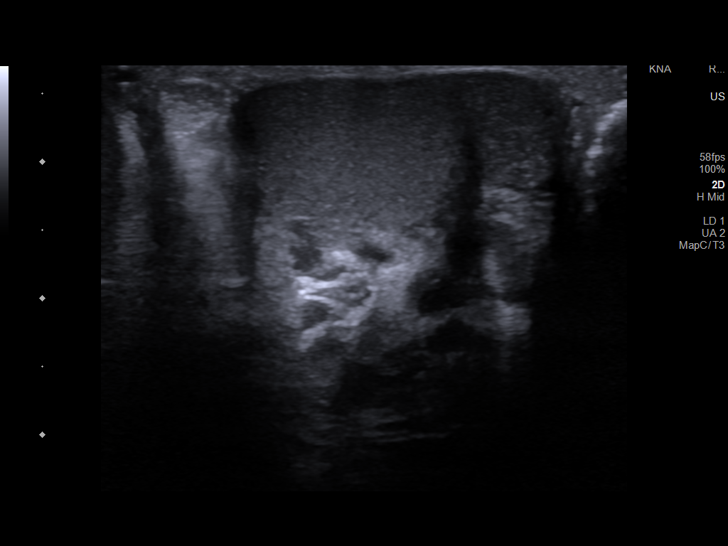
[im 52/96]
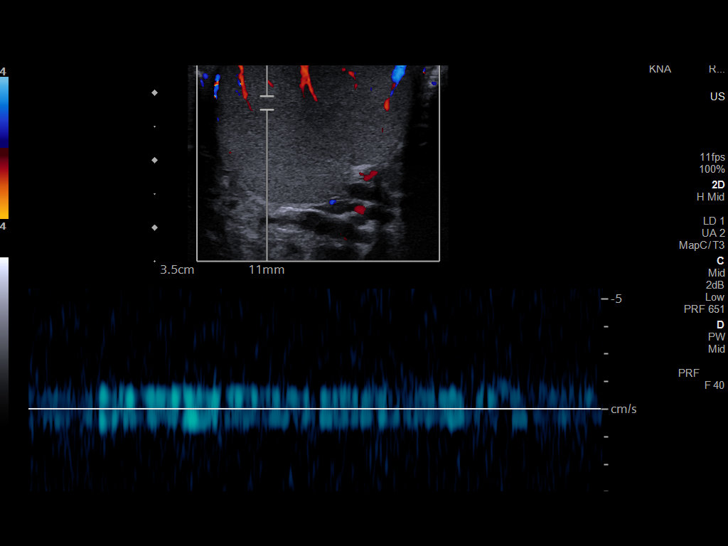
[im 60/96]
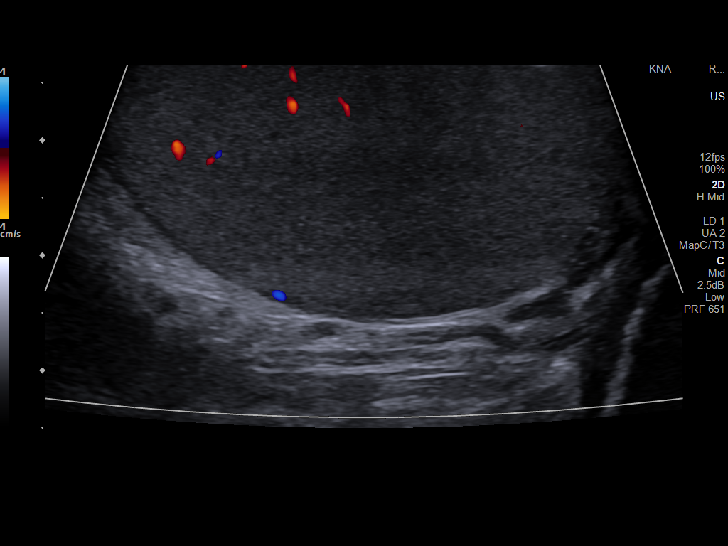
[im 64/96]
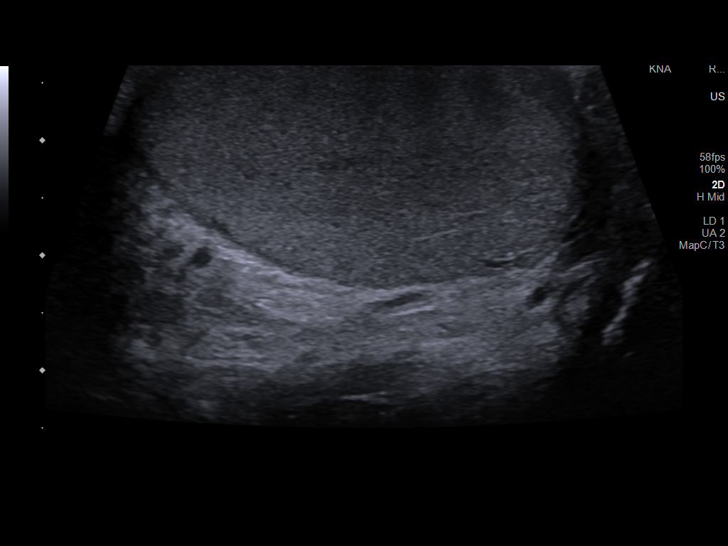
[im 72/96]
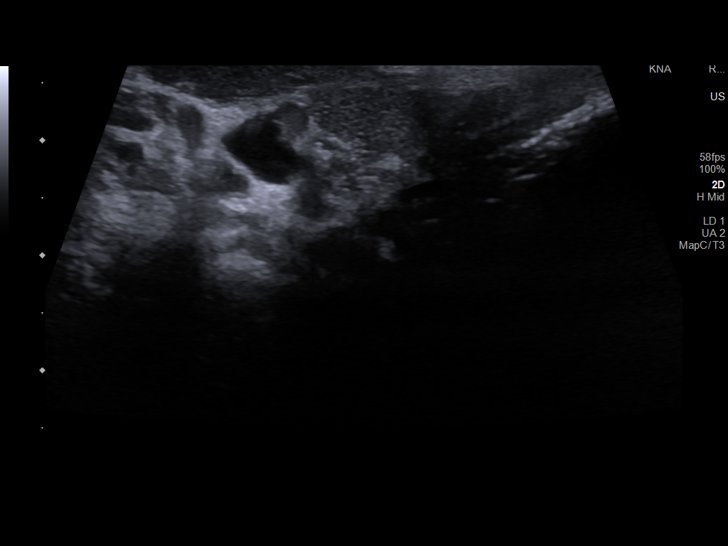
[im 80/96]
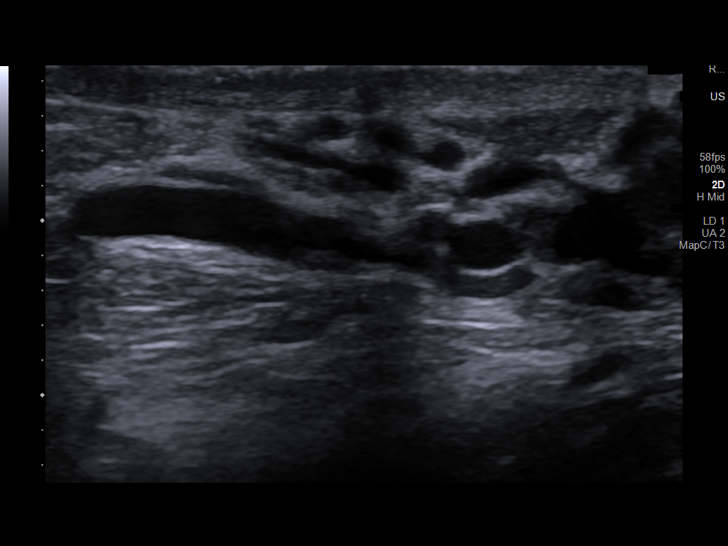
[im 88/96]
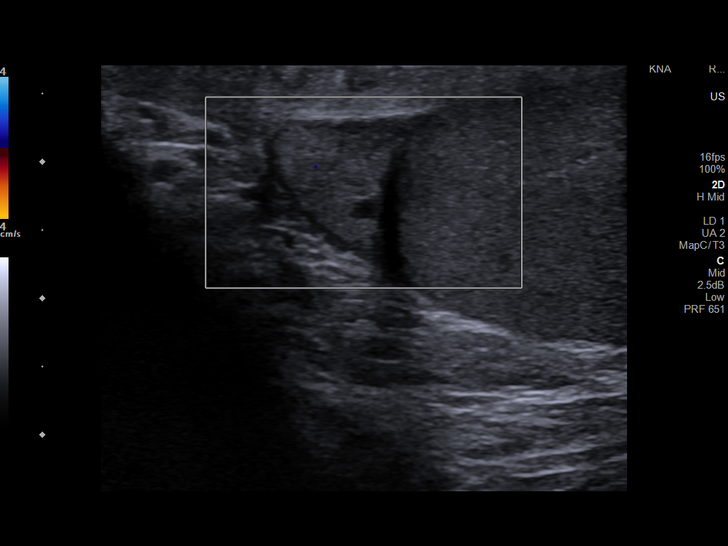
[im 96/96]
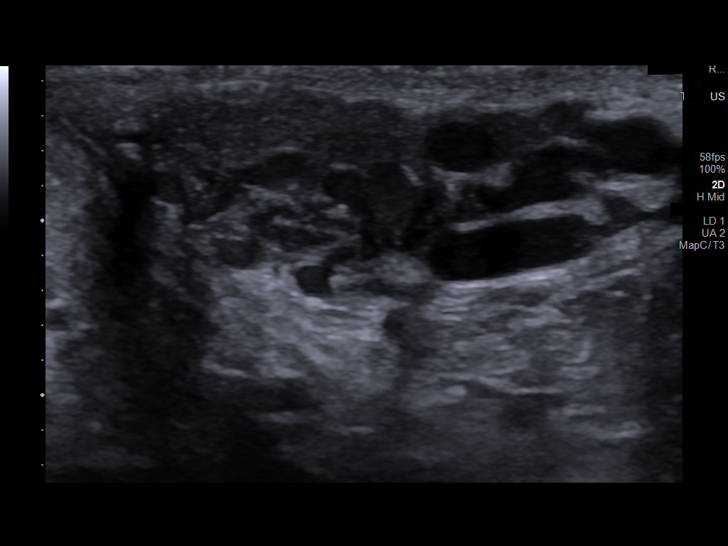

[14 of 25 positions shown; findings below may reference images not displayed]

FINDINGS: Right testicle

Measurements: 4.6 x 2.8 x 2.6 cm. No mass or microlithiasis
visualized.

Left testicle

Measurements: 4.6 x 2.8 x 2.3 cm. No mass or microlithiasis
visualized.

Right epididymis:  Normal in size and appearance.

Left epididymis:  2 mm cyst is noted.

Hydrocele:  Small left hydrocele is noted.

Varicocele:  Mild left varicocele is noted.

Pulsed Doppler interrogation of both testes demonstrates normal low
resistance arterial and venous waveforms bilaterally.
IMPRESSION: No evidence of testicular mass or torsion. Small left hydrocele.
Small left epididymal cyst. Mild left varicocele.

## 2022-06-21 ENCOUNTER — Ambulatory Visit: Payer: Medicaid Other | Admitting: Psychology

## 2022-07-15 ENCOUNTER — Encounter: Payer: Self-pay | Admitting: Psychology

## 2022-07-15 NOTE — Progress Notes (Signed)
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 relationship.  Given the patient's history and how he handled the situation this is a significant improvement in the way he responded.  The patient identified the maladaptive behavior and the woman that he had been seen and was able to appropriately in the relationship without obsessional thinking or trying to justify or control the situation.  The patient simply let her know that he was breaking off the relationship and began moving forward in his life.  09/27/2021: I have not seen the patient for several months now and reports that he has continued to work effectively at his job but acknowledges some ongoing intrusive thoughts now that we are at the 1 year anniversary of the death of his mother.  She was very important in his life and encouraged him to make all of the progress that he has had.  The patient admits to ongoing obsessional thinking about cars even though this is his occupation now he spent most of his free time thinking about working around cars as well.  The patient admits that this is had a deleterious effect on some other aspects of his life.  While the patient is maintaining the house he inherited after the death of his mother he is  also spent most of the extra cash that he inherited and is distressed by that.  He got into some financial difficulties and got behind on his bills but is now steadily work to getting his bills caught up and is setting up structured patterns to keeping bills up-to-date.  The patient has acute onset of crying spells at times but they tend to be short-lived and he gets focused on other things and often uses thinking about cars and other aspects as a way to avoid thinking about the passing of his mother.  We have continue to work on therapeutic interventions around his depression and anxiety/OCD symptoms and ongoing attentional deficits.  11/20/2021: The patient reports that he had a medical scare recently when he developed a lump and pain in his testicle with pain radiating up through his groin area.  The patient immediately began fearful that he had testicular cancer.  The patient reluctantly went to his PCP who then referred him to a urologist.  It turns out that he had a small cyst that was blocking proper function and an infection.  The patient talked about how it reminded him of his father developing multiple forms of cancer at the end of his father's life and the patient struggling with seeing his father diminished physically.  The patient reports that he began to obsess about fears of cancer and that he did not want to do chemo or radiation if he got cancer.  We addressed this issue specifically today and talked about how cancer comes in many forms and strategies for treatment are all the same.  The patient tends to lock into a particular idea and have difficulty shifting creating poor coping strategies.  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  They get along for the most part and have discussed issues that happened previously.  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  The patient reports that he continues to have significant depression when thinking about his mother's death from [place] and relating it to how sick he was and the fact that he did not die.  The patient continues to have guilty even though none is deserved.  The patient continues to be the primary caregiver for his children and the primary financial provider for his children.  Depressive symptoms have been less intense recently and for the most part he is managing to continue to work effectively.  Diagnosis:    Unspecified mood (affective) disorder (HCC)  Mixed obsessional thoughts and acts         Electronically Signed   _______________________ Ilean Skill, Psy.D.

## 2022-08-02 DIAGNOSIS — G4733 Obstructive sleep apnea (adult) (pediatric): Secondary | ICD-10-CM | POA: Diagnosis not present

## 2022-08-09 ENCOUNTER — Encounter: Payer: Medicaid Other | Attending: Psychology | Admitting: Psychology

## 2022-08-09 DIAGNOSIS — F422 Mixed obsessional thoughts and acts: Secondary | ICD-10-CM | POA: Diagnosis not present

## 2022-08-09 DIAGNOSIS — F39 Unspecified mood [affective] disorder: Secondary | ICD-10-CM | POA: Diagnosis not present

## 2022-08-09 DIAGNOSIS — G4733 Obstructive sleep apnea (adult) (pediatric): Secondary | ICD-10-CM | POA: Diagnosis not present

## 2022-08-14 ENCOUNTER — Encounter: Payer: Self-pay | Admitting: Psychology
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
## 2022-08-23 ENCOUNTER — Telehealth: Payer: Self-pay

## 2022-08-23 ENCOUNTER — Ambulatory Visit
Admission: EM | Admit: 2022-08-23 | Discharge: 2022-08-23 | Disposition: A | Discharge status: Home / Self Care | Payer: Medicaid Other | Attending: Nurse Practitioner | Admitting: Nurse Practitioner

## 2022-08-23 DIAGNOSIS — R55 Syncope and collapse: Secondary | ICD-10-CM | POA: Insufficient documentation

## 2022-08-23 DIAGNOSIS — J358 Other chronic diseases of tonsils and adenoids: Secondary | ICD-10-CM | POA: Insufficient documentation

## 2022-08-23 DIAGNOSIS — J029 Acute pharyngitis, unspecified: Secondary | ICD-10-CM | POA: Diagnosis not present

## 2022-08-23 DIAGNOSIS — R590 Localized enlarged lymph nodes: Secondary | ICD-10-CM | POA: Diagnosis not present

## 2022-08-23 DIAGNOSIS — E162 Hypoglycemia, unspecified: Secondary | ICD-10-CM

## 2022-08-23 LAB — POCT RAPID STREP A (OFFICE): Rapid Strep A Screen: NEGATIVE

## 2022-08-23 LAB — POCT FASTING CBG KUC MANUAL ENTRY: POCT Glucose (KUC): 108 mg/dL — AB (ref 70–99)

## 2022-08-23 MED ORDER — CHLORHEXIDINE GLUCONATE 0.12 % MT SOLN
15.0000 mL | Freq: Two times a day (BID) | OROMUCOSAL | 0 refills | Status: DC
Start: 2022-08-23 — End: 2022-10-16
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
## 2022-08-23 NOTE — Telephone Encounter (Signed)
Spoke with patient via phone. Patient advised per Dr Nicki Reaper Glucose slightly elevated at 108 I would recommend fasting glucose to be ordered at his convenience. Patient verbalized understanding Lab ordered.

## 2022-08-23 NOTE — Telephone Encounter (Signed)
Glucose slightly elevated at 108 I would recommend fasting glucose to be ordered at his convenience

## 2022-08-23 NOTE — Discharge Instructions (Addendum)
Your fasting blood sugar at this morning is 108.  This is slightly elevated.  Please follow-up with your primary care provider for more blood work to evaluate for diabetes.  In the meantime, make sure you are eating snacks that are high in protein and have a little bit of carbohydrates to prevent blood sugar from going up and down quickly.  Please start gargling with chlorhexidine mouth rinse twice daily to help prevent tonsil stones.  Rapid strep throat test is negative today and throat culture is pending.  We will call you early next week if the throat culture shows there is bacteria that needs treatment with antibiotic therapy.
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
## 2022-08-24 DIAGNOSIS — E162 Hypoglycemia, unspecified: Secondary | ICD-10-CM | POA: Diagnosis not present

## 2022-08-24 LAB — CULTURE, GROUP A STREP (THRC)

## 2022-08-25 LAB — GLUCOSE, RANDOM: Glucose: 100 mg/dL — ABNORMAL HIGH (ref 70–99)

## 2022-09-18 ENCOUNTER — Ambulatory Visit: Payer: Medicaid Other | Admitting: Family Medicine

## 2022-09-18 VITALS — BP 123/81 | Wt 211.4 lb

## 2022-09-18 DIAGNOSIS — J358 Other chronic diseases of tonsils and adenoids: Secondary | ICD-10-CM

## 2022-09-18 DIAGNOSIS — E162 Hypoglycemia, unspecified: Secondary | ICD-10-CM | POA: Diagnosis not present

## 2022-09-18 DIAGNOSIS — E7849 Other hyperlipidemia: Secondary | ICD-10-CM | POA: Diagnosis not present

## 2022-09-18 DIAGNOSIS — R55 Syncope and collapse: Secondary | ICD-10-CM | POA: Diagnosis not present
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
## 2022-09-27 ENCOUNTER — Ambulatory Visit: Payer: Medicaid Other | Admitting: Neurology

## 2022-09-27 ENCOUNTER — Encounter: Payer: Self-pay | Admitting: Neurology

## 2022-09-27 VITALS — BP 125/73 | HR 77 | Ht 66.0 in | Wt 210.0 lb

## 2022-09-27 DIAGNOSIS — Z9989 Dependence on other enabling machines and devices: Secondary | ICD-10-CM

## 2022-09-27 DIAGNOSIS — I479 Paroxysmal tachycardia, unspecified: Secondary | ICD-10-CM | POA: Insufficient documentation

## 2022-09-27 DIAGNOSIS — R61 Generalized hyperhidrosis: Secondary | ICD-10-CM | POA: Insufficient documentation

## 2022-09-27 DIAGNOSIS — E162 Hypoglycemia, unspecified: Secondary | ICD-10-CM | POA: Diagnosis not present

## 2022-09-27 DIAGNOSIS — R55 Syncope and collapse: Secondary | ICD-10-CM

## 2022-09-27 DIAGNOSIS — G4731 Primary central sleep apnea: Secondary | ICD-10-CM | POA: Diagnosis not present
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
## 2022-09-27 NOTE — Patient Instructions (Signed)
Near-Syncope Near-syncope is when you suddenly feel like you might pass out or faint. This may also be called presyncope. During an episode of near-syncope, you may: Feel dizzy, weak, or light-headed. It may feel like the room is spinning. Feel like you may vomit (nauseous). See spots or see all white or all black. Have cold, clammy skin. Feel warm and sweaty. Hear ringing in your ears. This condition is caused by a sudden decrease in blood flow to the brain. This can result from many causes, but most of those causes are not dangerous. However, near-syncope may be a sign of a serious medical problem, so it is important to seek medical care. Follow these instructions at home: Medicines Take over-the-counter and prescription medicines only as told by your doctor. If you are taking blood pressure or heart medicine, get up slowly and spend many minutes getting ready to sit and then stand. This can help with dizziness. Lifestyle Do not drive, use machinery, or play sports until your doctor says it is okay. Do not drink alcohol. Do not smoke or use any products that contain nicotine or tobacco. If you need help quitting, ask your doctor. Avoid hot tubs and saunas. General instructions Be aware of any changes in your symptoms. Talk with your doctor about your symptoms. You may need to have testing to help find the cause. If you start to feel like you might pass out, sit or lie down right away. If sitting, lower your head down between your legs. If lying down, raise (elevate) your feet above the level of your heart. Breathe deeply and steadily. Wait until all of the symptoms are gone. Have someone stay with you until you feel better. Drink enough fluid to keep your pee (urine) pale yellow. Avoid standing for a long time. If you must stand for a long time, do movements such as: Moving your legs. Crossing your legs. Flexing and stretching your leg muscles. Squatting. Keep all follow-up  visits. Contact a doctor if: You continue to have episodes of near fainting. Get help right away if: You pass out or faint. You have any of these symptoms: Fast or uneven heartbeats (palpitations). Pain in your chest, belly, or back. Shortness of breath. You have a seizure. You have a very bad headache. You are confused. You have trouble seeing. You are very weak. You have trouble walking. You are bleeding from your mouth or butt. You have black or tarry poop (stool). These symptoms may be an emergency. Get help right away. Call your local emergency services (911 in the [place] Do not wait to see if the symptoms will go away. Do not drive yourself to the hospital. Summary Near-syncope is when you suddenly feel like you might pass out or faint. This condition is caused by a sudden decrease in blood flow to the brain. Near-syncope may be a sign of a serious medical problem, so it is important to seek medical care. If you start to feel like you might pass out, sit or lie down right away. If sitting, lower your head down between your legs. If lying down, raise (elevate) your feet above the level of your heart. Talk with your doctor about your symptoms. You may need to have testing to help find the cause. This information is not intended to replace advice given to you by your health care provider. Make sure you discuss any questions you have with your health care provider. Document Revised: 12/15/2020 Document Reviewed: 12/15/2020 Elsevier Patient Education  2023  [employer].

## 2022-10-04 ENCOUNTER — Encounter: Payer: Medicaid Other | Admitting: Psychology

## 2022-10-08 ENCOUNTER — Encounter: Payer: Medicaid Other | Attending: Psychology | Admitting: Psychology

## 2022-10-08 DIAGNOSIS — G4733 Obstructive sleep apnea (adult) (pediatric): Secondary | ICD-10-CM | POA: Insufficient documentation

## 2022-10-08 DIAGNOSIS — F422 Mixed obsessional thoughts and acts: Secondary | ICD-10-CM | POA: Diagnosis not present

## 2022-10-08 DIAGNOSIS — F39 Unspecified mood [affective] disorder: Secondary | ICD-10-CM

## 2022-10-16 ENCOUNTER — Other Ambulatory Visit (INDEPENDENT_AMBULATORY_CARE_PROVIDER_SITE_OTHER): Payer: Medicaid Other

## 2022-10-16 ENCOUNTER — Ambulatory Visit: Payer: Medicaid Other | Attending: Internal Medicine | Admitting: Internal Medicine

## 2022-10-16 ENCOUNTER — Encounter: Payer: Self-pay | Admitting: Internal Medicine

## 2022-10-16 VITALS — BP 138/86 | HR 87 | Ht 66.0 in | Wt 208.7 lb

## 2022-10-16 DIAGNOSIS — R002 Palpitations: Secondary | ICD-10-CM | POA: Diagnosis not present

## 2022-10-16 DIAGNOSIS — H539 Unspecified visual disturbance: Secondary | ICD-10-CM | POA: Diagnosis not present

## 2022-10-16 DIAGNOSIS — R55 Syncope and collapse: Secondary | ICD-10-CM

## 2022-10-16 DIAGNOSIS — R079 Chest pain, unspecified: Secondary | ICD-10-CM | POA: Diagnosis not present

## 2022-10-16 NOTE — Patient Instructions (Signed)
Medication Instructions:  Your physician recommends that you continue on your current medications as directed. Please refer to the Current Medication list given to you today.  *If you need a refill on your cardiac medications before your next appointment, please call your pharmacy*   Lab Work: None If you have labs (blood work) drawn today and your tests are completely normal, you will receive your results only by: [place] (if you have MyChart) OR A paper copy in the mail If you have any lab test that is abnormal or we need to change your treatment, we will call you to review the results.   Testing/Procedures: Your physician has requested that you have an exercise tolerance test. For further information please visit [email or web address]. Please also follow instruction sheet, as given.    Follow-Up: At [facility], you and your health needs are our priority.  As part of our continuing mission to provide you with exceptional heart care, we have created designated Provider Care Teams.  These Care Teams include your primary Cardiologist (physician) and Advanced Practice Providers (APPs -  Physician Assistants and Nurse Practitioners) who all work together to provide you with the care you need, when you need it.  We recommend signing up for the patient portal called "MyChart".  Sign up information is provided on this After Visit Summary.  MyChart is used to connect with patients for Virtual Visits (Telemedicine).  Patients are able to view lab/test results, encounter notes, upcoming appointments, etc.  Non-urgent messages can be sent to your provider as well.   To learn more about what you can do with MyChart, go to [email or web address].    Your next appointment:   6 month(s)  Provider:   Claudina Lick, MD    Other Instructions [facility]- Long Term Monitor Instructions   Your physician has requested you wear your ZIO patch monitor___14___days.   This is a single  patch monitor.  [employer] supplies one patch monitor per enrollment.  Additional stickers are not available.   Please do not apply patch if you will be having a Nuclear Stress Test, Echocardiogram, Cardiac CT, MRI, or Chest Xray during the time frame you would be wearing the monitor. The patch cannot be worn during these tests.  You cannot remove and re-apply the ZIO XT patch monitor.   Your ZIO patch monitor will be sent USPS Priority mail from [facility] directly to your home address. The monitor may also be mailed to a PO BOX if home delivery is not available.   It may take 3-5 days to receive your monitor after you have been enrolled.   Once you have received you monitor, please review enclosed instructions.  Your monitor has already been registered assigning a specific monitor serial # to you.   Applying the monitor   Shave hair from upper left chest.   Hold abrader disc by orange tab.  Rub abrader in 40 strokes over left upper chest as indicated in your monitor instructions.   Clean area with 4 enclosed alcohol pads .  Use all pads to assure are is cleaned thoroughly.  Let dry.   Apply patch as indicated in monitor instructions.  Patch will be place under collarbone on left side of chest with arrow pointing upward.   Rub patch adhesive wings for 2 minutes.Remove white label marked "1".  Remove white label marked "2".  Rub patch adhesive wings for 2 additional minutes.   While looking in a mirror, press and release button in  center of patch.  A small green light will flash 3-4 times .  This will be your only indicator the monitor has been turned on.     Do not shower for the first 24 hours.  You may shower after the first 24 hours.   Press button if you feel a symptom. You will hear a small click.  Record Date, Time and Symptom in the Patient Log Book.   When you are ready to remove patch, follow instructions on last 2 pages of Patient Log Book.  Stick patch monitor onto last  page of Patient Log Book.   Place Patient Log Book in [place] box.  Use locking tab on box and tape box closed securely.  The [employer] has [employer] on it.  Please place in mailbox as soon as possible.  Your physician should have your test results approximately 7 days after the monitor has been mailed back to [facility].   Call [facility] at [phone] if you have questions regarding your ZIO XT patch monitor.  Call them immediately if you see an orange light blinking on your monitor.   If your monitor falls off in less than 4 days contact our Monitor department at [phone].  If your monitor becomes loose or falls off after 4 days call [employer] at [phone] for suggestions on securing your monitor.
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
## 2022-10-18 ENCOUNTER — Encounter: Payer: Self-pay | Admitting: Radiology

## 2022-10-23 ENCOUNTER — Telehealth: Payer: Self-pay | Admitting: Neurology

## 2022-10-23 ENCOUNTER — Ambulatory Visit (HOSPITAL_COMMUNITY)
Admission: RE | Admit: 2022-10-23 | Discharge: 2022-10-23 | Disposition: A | Discharge status: Home / Self Care | Payer: Medicaid Other | Source: Ambulatory Visit | Attending: Family Medicine | Admitting: Family Medicine

## 2022-10-23 DIAGNOSIS — G4733 Obstructive sleep apnea (adult) (pediatric): Secondary | ICD-10-CM

## 2022-10-23 DIAGNOSIS — R079 Chest pain, unspecified: Secondary | ICD-10-CM | POA: Insufficient documentation

## 2022-10-23 LAB — EXERCISE TOLERANCE TEST
Angina Index: 0
Duke Treadmill Score: 9
Estimated workload: 10.1
Exercise duration (min): 8 min
Exercise duration (sec): 58 s
MPHR: 190 {beats}/min
Peak HR: 151 {beats}/min
Percent HR: 79 %
RPE: 16
Rest HR: 71 {beats}/min
ST Depression (mm): 0 mm

## 2022-10-23 NOTE — Telephone Encounter (Signed)
Patient left a voicemail on my phone stating it has been a couple weeks since he saw Dr. Brett Fairy and they discussed having another SS, I don't see an order put in.

## 2022-10-23 NOTE — Telephone Encounter (Signed)
Order placed for split night considering the patient is current CPAP user.

## 2022-10-30 ENCOUNTER — Encounter: Payer: Self-pay | Admitting: Psychology

## 2022-10-30 NOTE — Progress Notes (Signed)
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 the patient is described in detail prior.  The other person involved in this incident where the patient was charged and convicted reportedly instigated this conflict and the patient initially was defending himself.  However, after the conflict 1 called the police and since she had a small mark on her the patient was charged with assault on a male.  The patient was not able to effectively defend himself against these charges and ended up getting convicted.  The patient has completed his probation now and is hoping to try to get this expunged from his record.  However, this resulted in him not getting the job.  Because he had done very well at the previous job that he left and was feeling better he went back to his previous employer and they had to come back immediately.  The patient reports that he is feeling much better about work at this point and attributes some of it to a period of time where is very hot at work and his air conditioning was broken at home and he also probably ended up with a mood swing.  The patient is also started a relationship with a woman in [place] both taking it very slow and I have encouraged him to take his time and developing this  relationship which they are both designed to take very slow as the patient is very cautious about getting involved with someone else as the last time he was in a significant relationship it ended up with him getting criminal charges and the time before was long marriage that ended with his wife leaving him the patient 1 not wanting to be divorced but to ended up becoming the primary parent for 3 children.  He has effectively been able to manage these parental responsibilities and does a very good job with his children.  05/22/2021: The patient is continuing to do well taking care of his kids is the primary caretaker.  The patient reports that he has settled back into his return to his old job and is doing well there and not having the same issues he had over the summer.  The patient reports that the young woman that he had begun dating engaged in some significantly manipulative behavior recently and the patient ended up breaking off the relationship.  Given the patient's history and how he handled the situation this is a significant improvement in the way he responded.  The patient identified the maladaptive behavior and the woman that he had been seen and was able to appropriately in the relationship without obsessional thinking or trying to justify or control the situation.  The patient simply let her know that he was breaking off the relationship and began moving forward in his life.  09/27/2021: I have not seen the patient for several months now and reports that he has continued to work effectively at his job but acknowledges some ongoing intrusive thoughts now that we are at the 1 year anniversary of the death of his mother.  She was very important in his  life and encouraged him to make all of the progress that he has had.  The patient admits to ongoing obsessional thinking about cars even though this is his occupation now he spent most of his free time thinking about working around cars as well.  The patient  admits that this is had a deleterious effect on some other aspects of his life.  While the patient is maintaining the house he inherited after the death of his mother he is also spent most of the extra cash that he inherited and is distressed by that.  He got into some financial difficulties and got behind on his bills but is now steadily work to getting his bills caught up and is setting up structured patterns to keeping bills up-to-date.  The patient has acute onset of crying spells at times but they tend to be short-lived and he gets focused on other things and often uses thinking about cars and other aspects as a way to avoid thinking about the passing of his mother.  We have continue to work on therapeutic interventions around his depression and anxiety/OCD symptoms and ongoing attentional deficits.  11/20/2021: The patient reports that he had a medical scare recently when he developed a lump and pain in his testicle with pain radiating up through his groin area.  The patient immediately began fearful that he had testicular cancer.  The patient reluctantly went to his PCP who then referred him to a urologist.  It turns out that he had a small cyst that was blocking proper function and an infection.  The patient talked about how it reminded him of his father developing multiple forms of cancer at the end of his father's life and the patient struggling with seeing his father diminished physically.  The patient reports that he began to obsess about fears of cancer and that he did not want to do chemo or radiation if he got cancer.  We addressed this issue specifically today and talked about how cancer comes in many forms and strategies for treatment are all the same.  The patient tends to lock into a particular idea and have difficulty shifting creating poor coping strategies.  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  They get along for the most part and have discussed issues that happened previously.  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  The patient reports that he continues to have significant depression when thinking about his mother's death from [place] and relating it to how sick he was and the fact that he did not die.  The patient continues to have guilty even though none is deserved.  The patient continues to be the primary caregiver for his children and the primary financial provider for his children.  Depressive symptoms have been less intense recently and for the most part he is managing to continue to work effectively.  08/09/2022: The patient came in today and because of holidays and no childcare he brought his 2 children with him.  The patient was in great distress returning today reporting that a teacher in his child's school had reported to child protective services that the children  appeared to be wearing dirty close and reported concerns.  CPS did a home visit and everything went well but this still caused a great deal of stress for the patient.  He has worked very hard to be a good father for his kids but admitted that he has had a lot of ongoing depression around the death of his mother and has had difficulty moving forward.  He informed me that he had provided them with my name is someone that he has been seen and was okay if I talked with him.  The patient also talked about getting therapy for his kids as they are also struggling with the death of their grandmother who had been very important to the patient and his children.  I made a recommendation regarding outpatient behavioral health services provided through [facility] and if that did not work I would provide  him with other names of referrals for outside the [facility] system.  There is someone in the [place] area but I am not sure of their expertise regarding care of children.  10/08/2022: The patient reports that he has been doing much better recently and is continue to work.  The patient reports that CPS has done and completed their formal review and found no issues with the patient and his parenting.  Patient admits that he has been very depressed and struggled with all of the responsibilities he has had but given his efforts and his essentially sole responsibility at raising his 2 children this stress is understandable.  The patient has taken parenting classes and felt that it was very helpful for him and learning more effective strategies and care of his kids.  Diagnosis:    Unspecified mood (affective) disorder (HCC)  Mixed obsessional thoughts and acts  OSA on CPAP         Electronically Signed   _______________________ Ilean Skill, Psy.D.

## 2022-11-08 ENCOUNTER — Telehealth: Payer: Self-pay | Admitting: Neurology

## 2022-11-08 NOTE — Telephone Encounter (Signed)
MCD healthy blue pending uploaded notes on the portal  

## 2022-11-12 NOTE — Telephone Encounter (Signed)
Split-MCD Healthy blue Josem KaufmannXM:[id number] (exp. 11/08/22 to 01/06/23)  Sent mychart message.

## 2022-12-10 NOTE — Telephone Encounter (Signed)
Split- MCD Healthy blue Berkley Harvey: [id number] (exp. 01/29/23 to 03/29/23)   Patient is scheduled at [facility] for 01/29/23 at 9 pm.

## 2022-12-25 ENCOUNTER — Encounter: Payer: Medicaid Other | Attending: Psychology | Admitting: Psychology

## 2022-12-25 DIAGNOSIS — F422 Mixed obsessional thoughts and acts: Secondary | ICD-10-CM | POA: Insufficient documentation

## 2022-12-25 DIAGNOSIS — G4733 Obstructive sleep apnea (adult) (pediatric): Secondary | ICD-10-CM | POA: Insufficient documentation

## 2022-12-25 DIAGNOSIS — F39 Unspecified mood [affective] disorder: Secondary | ICD-10-CM | POA: Insufficient documentation

## 2023-01-16 ENCOUNTER — Encounter: Payer: Self-pay | Admitting: Psychology
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
## 2023-01-29 ENCOUNTER — Ambulatory Visit (INDEPENDENT_AMBULATORY_CARE_PROVIDER_SITE_OTHER): Payer: Medicaid Other | Admitting: Neurology

## 2023-01-29 DIAGNOSIS — G4733 Obstructive sleep apnea (adult) (pediatric): Secondary | ICD-10-CM

## 2023-02-05 ENCOUNTER — Ambulatory Visit: Payer: Medicaid Other | Admitting: Neurology

## 2023-02-05 ENCOUNTER — Encounter: Payer: Self-pay | Admitting: Neurology

## 2023-02-05 VITALS — BP 133/84 | HR 69 | Ht 66.0 in | Wt 215.0 lb

## 2023-02-05 DIAGNOSIS — R55 Syncope and collapse: Secondary | ICD-10-CM | POA: Diagnosis not present

## 2023-02-05 DIAGNOSIS — G4731 Primary central sleep apnea: Secondary | ICD-10-CM | POA: Diagnosis not present

## 2023-02-05 DIAGNOSIS — Z9989 Dependence on other enabling machines and devices: Secondary | ICD-10-CM | POA: Diagnosis not present
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
## 2023-02-05 NOTE — Patient Instructions (Signed)
ASSESSMENT AND PLAN 32  year- old male  CPAP dependent patient  here with:     1) confirmed moderate sleep apnea as seen in the baseline part of his recent split-night study from 6/06-2023.  Without CPAP the patient had great difficulties to sustain sleep.  Sleep efficiency was 62%, AHI was 24.2/h and 1 CPAP at 7 cm water was initiated and not further titrated he was able to reach a sleep efficiency of over 90% and an AHI of less than 1/h.   2) I am also able to see the latest compliance data this patient has used his machine 90%, the average user time is 6 hours 12 minutes.  He uses CPAP as an auto titrated device between a minimum pressure of 6 and a maximum pressure of 15 cmH2O with 2 cm EPR and a residual AHI of 2.2/h.   95th percentile pressure was 10.5 at home and air leak was moderate at 25.4 L.  He continues to use the [employer] nasal mask in size medium to large.   3) his current CPAP machine is well over 72 years old and he reports that it is not reliably providing the same pressure.  The patient also has noted a kink in the adapter from machine to the hose.   Will order today and new auto titration device from [facility] , this time without a ramp setting and with a starting pressure of 7 cm water , no EPR , maximum pressure 12 cm water.  Keep the same mask, heated humidifier will be provided.  The patient is advised to avoid supine sleep position.     The patient has had a near syncope before being seeing last time, he reports there were a lot of stressors in his life and he has been abe to reduce these, keep hydrated, and his new girlfriend is supportive .    I plan to follow up through our NP within 3 months.

## 2023-02-08 NOTE — Procedures (Signed)
[facility] SPLIT NIGHT INTERPRETATION REPORT   STUDY DATE: 01/29/2023     PATIENT NAME:  [name]         DATE OF BIRTH:  05-Feb-1991  PATIENT ID:  [id number]    TYPE OF STUDY:  PSG  READING PHYSICIAN: Melvyn Novas, MD   SCORING TECHNICIAN: Sheena Fields   INDICATIONS:  This 32 year-old Male returns for a SPLIT study after having been a CPAP user for 5 years, having recently ( February 24 ) presented with tachycardia, palpitations and syncope.  The Epworth Sleepiness Scale was 11 out of 24 (scores above or equal to 10 are suggestive of hypersomnolence).  DESCRIPTION: A sleep technologist was in attendance for the duration of the recording.  Data collection, scoring, video monitoring, and reporting were performed in compliance with the AASM Manual for the Scoring of Sleep and Associated Events; (Hypopnea is scored based on the criteria listed in Section VIII D. 1b in the AASM Manual V2.6 using a 4% oxygen desaturation rule or Hypopnea is scored based on the criteria listed in Section VIII D. 1a in the AASM Manual V2.6 using 3% oxygen desaturation and /or arousal rule).  A physician certified by the American Board of Sleep Medicine reviewed each epoch of the study.  ADDITIONAL INFORMATION:  Height: 66.0 in Weight: 210 lb (BMI 33) Neck Size: 17.0 in Medications: Ventolin, Flonase, Claritin, Peridex  STUDY DETAILS: Lights off was at 20:56: and lights on 05:00: (483 minutes hours in bed). This study was performed with an initial diagnostic portion followed by positive airway pressure titration.  Part 1 DIAGNOSTIC ANALYSIS   SLEEP CONTINUITY AND SLEEP ARCHITECTURE:  The diagnostic portion of the study began at 20:56 and ended at 00:17, for a recording time of 3h 21.62m minutes.  Total sleep time was 124 minutes (73.8% supine;  26.2% lateral;  0.0% prone, 16.1% REM sleep), with a decreased sleep efficiency at 61.7%.  Sleep latency was increased at 66.5  minutes.  REM sleep latency was increased at 106.0 minutes.   Arousal index was 12.6 /h.  Of the total sleep time, the percentage of stage N1 sleep was 8.1%, stage N2 sleep was 60.1%, stage N3 sleep was 15.7%, and REM sleep was 16.1%. There were 1 Stage R periods observed during this portion of the study, 4 awakenings (i.e. transitions to Stage W from any sleep stage), and 17.0 total stage transitions.  Wake after sleep onset (WASO) time accounted for 10 minutes.   AROUSAL (Baseline): There were 23.0 arousals in total, for an arousal index of 11.1 arousals/hour.  Of these, 14.0 were identified as respiratory-related arousals (6.8 /hr), 0 were PLM-related arousals (0.0 /hr), and 12 were non-specific arousals (5.8 /hr)   RESPIRATORY MONITORING:   Based on AASM criteria (using a 3% oxygen desaturation and /or arousal rule for scoring hypopneas), there were 0 apneas (0 obstructive; 0 central; 0 mixed), and 50 hypopneas.   The Apnea index was 0.0. The Hypopnea index was 23.7. AHI : The apnea-hypopnea index was 23.7 overall (22.7 supine; 3.0 REM, 0.0 supine REM). There were 0 respiratory effort-related arousals (RERAs).    OXIMETRY:  Sleep was associated with oxyhemoglobin desaturations (nadir 80%) from a normal baseline (mean 96%). Total time spent at, or below 88% was 0.4 minutes, or 0.3%  of total sleep time.   LIMB MOVEMENTS: There were 0 periodic limb movements of sleep (0.0/hr),      BODY POSITION: Duration of total sleep and  percent of total sleep in their respective position is as follows: supine 91 minutes (73.8%), non-supine 32.5 minutes (26.2%); right 00 minutes (0.0%), left 32 minutes (26.2%), and prone 00 minutes (0.0%). Total supine REM sleep time was 00 minutes (0.0% of total REM sleep).   Analysis of electrocardiogram activity showed the highest heart rate for the baseline portion of the study was 89.0 beats per minute.  The average heart rate during sleep was 68 bpm, while the  highest heart rate for the same period was 86 bpm.     Part 2 TREATMENT ANALYSIS   SLEEP CONTINUITY AND SLEEP ARCHITECTURE:  The treatment portion of the study began at 00:17 and ended at 05:00, for a recording time of 4h 42.95m minutes.  Total sleep time was 272 minutes (14.3% supine;  66.0% lateral; 19.7% prone, 24.8% REM sleep), with a high sleep efficiency at 96.3%. Sleep latency was decreased at 0.0 minutes.  REM sleep latency was normal at 72.5 minutes.   Of the total sleep time, the percentage of stage N1 sleep was 1.3%, stage N3 sleep was 15.6%, and REM sleep was 24.8%. There were 2 Stage R periods observed during this portion of the study, 7 awakenings (i.e. transitions to Stage W from any sleep stage), and 23.0 total stage transitions. Wake after sleep onset (WASO) time accounted for 10 minutes. BODY POSITION: Duration of total sleep and percent of total sleep in their respective position is as follows: supine 39 minutes (14.3%), non-supine 233.0 minutes (85.7%); right 50 minutes (18.4%), left 129 minutes (47.6%), and prone 53 minutes (19.7%). Total supine REM sleep time was 00 minutes (0.0% of total REM sleep).    RESPIRATORY MONITORING:    While on PAP therapy, based on AASM criteria, the apnea-hypopnea index was 0.2 overall (0.0 supine; 0.2 REM).   Respiratory events were associated with oxyhemoglobin desaturation (nadir 93.0%) from a mean of 99.0%.  Total time spent at, or below 88% was 0.3 minutes, or 0.1%  of total sleep time.  Snoring was absent:  . There were 0.0 occurrences of Cheyne Stokes breathing.   OXIMETRY: Total sleep time spent at, or below 88% was 0.0 minutes, or 0.0% of total sleep time.   CARDIAC: The electrocardiogram documented NSR   The average heart rate during sleep was 62 bpm.  The maximum heart rate during sleep was 92 bpm.   LIMB MOVEMENTS: There were 0 periodic limb movements of sleep (0.0/hr),      AROUSAL: Arousal index was 6.4 /hr.  Of these, 1.0  were identified as respiratory-related arousals (0.2 /hr), 0 were PLM-related arousals (0.0 /hr), and 28 were non-specific arousals (6.2 /hr)    IMPRESSION: CPAP dependent patient with moderate degree of obstructive sleep apnea. All events were hypopneas.  The baseline part of the study revealed an AHI of 24.1/H, sleep efficiency was only 61.7 % but increased after CPAP was initiated to 96.3%.   CPAP was started at 7 cm water (again with no EPR) and he could not tolerate a lower starting pressure.  He remained for the whole night at 7 cm of water with a resolution of AHI at 0.2/h.     RECOMMENDATIONS: Continuation of CPAP therapy.  He continues to use the [employer] and [employer] nasal mask in size medium to large. CPAP device has already been ordered on 02-05-2023.    Melvyn Novas, MD                General Information  Name: Mysliwiec,  Zorawar BMI: 33.90 Physician: Melvyn Novas, MD  ID: [id number] Height: 66.0 in Technician: Margaretann Loveless, RPSGT  Sex: Male Weight: 210.0 lb Record: [id number]  Age: 61 [11-Mar-1991] Date: 01/29/2023     Medical & Medication History    GLENARD KEESLING is a 32 y.o. male patient . He is a compliant CPAP user, has developed a good routine. The patient is endorsing a high degree of fatigue and just above average degree of sleepiness, when he underwent his sleep study in 2019 he was diagnosed with central sleep apnea there was a strong positional component. In supine sleep his AHI was 69 in non-supine sleep 4.6/h so avoiding sleeping on the back would be very important- the overall AHI was 16.3/h. He had less frequent apnea during REM sleep than in non-REM sleep which is an indication of central origin. He was soft snorer not a loud snorer. He had 71 central apneas 6 mixed apneas and no obstructive events. His CPAP compliance is excellent 97% he used the machine 29 out of 30 days and 27 days over 4 hours consecutively with an average of 6 hours 19  minutes. Minimum pressure was set at 6 and maximum at 15 cmH2O with 2 cm expiratory relief pressure. The AHI was 1.6/h which is excellent and there are some obstructive apneas in the residual pool more than central apneas. 95th percentile pressure was 11 cm water. So his current settings cover his need for apnea. His machine was set up in May 2019 and would be replaced by the spring when it 31 years old he does not use the heated humidification feature.  Ventolin, Flonase, Claritin, Peridex   Sleep Disorder      Comments   The patient came into the lab for a SPLIT night study. The patient had a sleep study on 10/08/18 with our sleep lab. The patient was split at an AHI of f15 due to the patient not being able to sleep without CPAP. The patient was fitted with a F&P Brevida (Nasal) size M-L. The mask was a good fit to face, and the patient tolerate the mask well. The interface is the patients at home mask. CPAP was started at [facility] with no EPR. CPAP was started at this pressure because the patient said any lower pressure made him feel like he could not breathe. CPAP was not increased any higher than 7cmH2O. The patient had no restroom breaks. EKG kept in NSR. Mild snoring prior to CPAP placement. All sleep stages witnessed. Respiratory events scored with a 4% desat. Slept lateral and supine. Majority of respiratory events in supine position. AHI was 24.1 after 2 hrs of TST.    Baseline Sleep Stage Information Baseline start time: 08:56:44 PM Baseline end time: 12:17:29 AM   Time Total Supine Side Prone Upright  Recording 3h 21.41m 2h 41.42m 0h 40.62m 0h 0.72m 0h 0.57m  Sleep 2h 4.58m 1h 31.55m 0h 32.40m 0h 0.59m 0h 0.79m   Latency N1 N2 N3 REM Onset Per. Slp. Eff.  Actual 1h 6.16m 1h 11.25m 1h 39.29m 1h 46.63m 1h 6.79m 1h 6.90m 61.69%   Stg Dur Wake N1 N2 N3 REM  Total 10.5 10.0 74.5 19.5 20.0  Supine 3.0 6.5 65.5 19.5 0.0  Side 7.5 3.5 9.0 0.0 20.0  Prone 0.0 0.0 0.0 0.0 0.0  Upright 0.0 0.0 0.0 0.0 0.0   Stg %  Wake N1 N2 N3 REM  Total 7.8 8.1 60.1 15.7 16.1  Supine 2.2 5.2 52.8 15.7 0.0  Side  5.6 2.8 7.3 0.0 16.1  Prone 0.0 0.0 0.0 0.0 0.0  Upright 0.0 0.0 0.0 0.0 0.0    CPAP Sleep Stage Information CPAP start time: 12:17:29 AM CPAP end time: 05:00:21 AM   Time Total Supine Side Prone Upright  Recording (TRT) 4h 42.80m 0h 39.24m 3h 9.45m 0h 54.45m 0h 0.38m  Sleep (TST) 4h 32.53m 0h 39.13m 2h 59.52m 0h 53.12m 0h 0.77m   Latency N1 N2 N3 REM Onset Per. Slp. Eff.  Actual 0h 5.41m 0h 0.27m 0h 24.53m 1h 12.109m 0h 0.70m 0h 5.37m 96.28%   Stg Dur Wake N1 N2 N3 REM  Total 10.5 3.5 158.5 42.5 67.5  Supine 0.0 0.5 38.5 0.0 0.0  Side 10.0 3.0 109.0 42.5 25.0  Prone 0.5 0.0 11.0 0.0 42.5  Upright 0.0 0.0 0.0 0.0 0.0   Stg % Wake N1 N2 N3 REM  Total 3.7 1.3 58.3 15.6 24.8  Supine 0.0 0.2 14.2 0.0 0.0  Side 3.5 1.1 40.1 15.6 9.2  Prone 0.2 0.0 4.0 0.0 15.6  Upright 0.0 0.0 0.0 0.0 0.0    Baseline Respiratory Information Apnea Summary Sub Supine Side Prone Upright  Total 0 Total 0 0 0 0 0    REM 0 0 0 0 0    NREM 0 0 0 0 0  Obs 0 REM 0 0 0 0 0    NREM 0 0 0 0 0  Mix 0 REM 0 0 0 0 0    NREM 0 0 0 0 0  Cen 0 REM 0 0 0 0 0    NREM 0 0 0 0 0   Rera Summary Sub Supine Side Prone Upright  Total 0 Total 0 0 0 0 0    REM 0 0 0 0 0    NREM 0 0 0 0 0   Hypopnea Summary Sub Supine Side Prone Upright  Total 49 Total 49 47 2 0 0    REM 1 0 1 0 0    NREM 48 47 1 0 0   4% Hypopnea Summary Sub Supine Side Prone Upright  Total (4%) 49 Total 49 47 2 0 0    REM 1 0 1 0 0    NREM 48 47 1 0 0     AHI Total Obs Mix Cen  23.71 Apnea 0.00 0.00 0.00 0.00   Hypopnea 23.71 -- -- --  23.71 Hypopnea (4%) 23.71 -- -- --    Total Supine Side Prone Upright  Position AHI 23.71 30.82 3.69 0.00 0.00  REM AHI 3.00   NREM AHI 27.69   Position RDI 23.71 30.82 3.69 0.00 0.00  REM RDI 3.00   NREM RDI 27.69    4% Hypopnea Total Supine Side Prone Upright  Position AHI (4%) 23.71 30.82 3.69 0.00 0.00  REM AHI (4%) 3.00    NREM AHI (4%) 27.69   Position RDI (4%) 23.71 30.82 3.69 0.00 0.00  REM RDI (4%) 3.00   NREM RDI (4%) 27.69    CPAP Respiratory Information Apnea Summary Sub Supine Side Prone Upright  Total 0 Total 0 0 0 0 0    REM 0 0 0 0 0    NREM 0 0 0 0 0  Obs 0 REM 0 0 0 0 0    NREM 0 0 0 0 0  Mix 0 REM 0 0 0 0 0    NREM 0 0 0 0 0  Cen 0 REM 0 0 0 0 0  NREM 0 0 0 0 0   Rera Summary Sub Supine Side Prone Upright  Total 0 Total 0 0 0 0 0    REM 0 0 0 0 0    NREM 0 0 0 0 0   Hypopnea Summary Sub Supine Side Prone Upright  Total 1 Total 1 0 0 1 0    REM 1 0 0 1 0    NREM 0 0 0 0 0   4% Hypopnea Summary Sub Supine Side Prone Upright  Total (4%) 1 Total 1 0 0 1 0    REM 1 0 0 1 0    NREM 0 0 0 0 0     AHI Total Obs Mix Cen  0.22 Apnea 0.00 0.00 0.00 0.00   Hypopnea 0.22 -- -- --  0.22 Hypopnea (4%) 0.22 -- -- --    Total Supine Side Prone Upright  Position AHI 0.22 0.00 0.00 1.12 0.00  REM AHI 0.89   NREM AHI 0.00   Position RDI 0.22 0.00 0.00 1.12 0.00  REM RDI 0.89   NREM RDI 0.00    4% Hypopnea Total Supine Side Prone Upright  Position AHI (4%) 0.22 0.00 0.00 1.12 0.00  REM AHI (4%) 0.89   NREM AHI (4%) 0.00   Position RDI (4%) 0.22 0.00 0.00 1.12 0.00  REM RDI (4%) 0.89   NREM RDI (4%) 0.00    Desaturation Information (Baseline)  <100% <90% <80% <70% <60% <50% <40%  Supine 49 5 0 0 0 0 0  Side 8 0 0 0 0 0 0  Prone 0 0 0 0 0 0 0  Upright 0 0 0 0 0 0 0  Total 57 5 0 0 0 0 0  Desaturation threshold setting: 4% Minimum desaturation setting: 10 seconds SaO2 nadir: 80% The longest event was a 61 sec obstructive Hypopneawith a minimum SaO2 of 91%. The lowest SaO2 was 88% associated with a 22 sec obstructive Hypopnea. Awakening/Arousal Information (Baseline) # of Awakenings 4  Wake after sleep onset 10.72m  Wake after persistent sleep 10.52m   Arousal Assoc. Arousals Index  Apneas 0 0.0  Hypopneas 14 6.8  Leg Movements 0 0.0  Snore 0.0 0.0  PTT Arousals 0 0.0   Spontaneous 12 5.8  Total 26 12.6   Desaturation Information (CPAP)  <100% <90% <80% <70% <60% <50% <40%  Supine 0 0 0 0 0 0 0  Side 1 0 0 0 0 0 0  Prone 1 0 0 0 0 0 0  Upright 0 0 0 0 0 0 0  Total 2 0 0 0 0 0 0  Desaturation threshold setting: 4% Minimum desaturation setting: 10 seconds SaO2 nadir: 88% The longest event was a 29 sec obstructive Hypopnea with a minimum SaO2 of 93%. The lowest SaO2 was 93% associated with a 29 sec obstructive Hypopnea. Awakening/Arousal Information (CPAP) # of Awakenings 7  Wake after sleep onset 10.24m  Wake after persistent sleep 6.51m   Arousal Assoc. Arousals Index  Apneas 0 0.0  Hypopneas 1 0.2  Leg Movements 0 0.0  Snore 0.0 0.0  PTT Arousals 0 0.0  Spontaneous 28 6.2  Total 29 6.4     EKG Rates (Baseline) EKG Avg Max Min  Awake 74 89 63  Asleep 68 86 57  EKG Events: N/A Myoclonus Information (Baseline) PLMS LMs Index  Total LMs during PLMS 0 0.0  LMs w/ Microarousals 0 0.0   LM LMs Index  w/ Microarousal  0 0.0  w/ Awakening 0 0.0  w/ Resp Event 0 0.0  Spontaneous 0 0.0  Total 0 0.0   EKG Rates (CPAP) EKG Avg Max Min  Awake 70 92 54  Asleep 59 92 48  EKG Events: N/A Myoclonus Information (CPAP) PLMS LMs Index  Total LMs during PLMS 0 0.0  LMs w/ Microarousals 0 0.0   LM LMs Index  w/ Microarousal 0 0.0  w/ Awakening 0 0.0  w/ Resp Event 0 0.0  Spontaneous 1 0.2  Total 1 0.2

## 2023-03-18 DIAGNOSIS — G4733 Obstructive sleep apnea (adult) (pediatric): Secondary | ICD-10-CM | POA: Diagnosis not present

## 2023-03-28 ENCOUNTER — Encounter: Payer: Medicaid Other | Attending: Psychology | Admitting: Psychology

## 2023-03-28 DIAGNOSIS — G4733 Obstructive sleep apnea (adult) (pediatric): Secondary | ICD-10-CM | POA: Insufficient documentation

## 2023-03-28 DIAGNOSIS — F39 Unspecified mood [affective] disorder: Secondary | ICD-10-CM | POA: Diagnosis not present

## 2023-03-28 DIAGNOSIS — F422 Mixed obsessional thoughts and acts: Secondary | ICD-10-CM | POA: Insufficient documentation

## 2023-03-28 NOTE — Progress Notes (Signed)
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 and weakness and his friend took him to the emergency department.  This all happened within a week of having his second COVID booster shot and he attributed it to that.  However, he also has a history of some migraines and these types of neurological events before so is unclear the exact etiological aspects.  The patient recovered while at the emergency department and has had no issues since. Today we continue to work on therapeutic interventions around better coping skills and strategies related to his mood disorder and obsessive thinking.  04/10/2021: The patient reports that he has had an episode where he became more agitated and irritable that affected his work situation significantly as well as impacting frustration at home and interactions with his children became more irritable.  The patient reports that it has improved over the past several days and he feels like he is back to himself.  However, during this episode the patient became increasingly frustrated at work and actually decided he was going to leave his employment and go back to work for a company he had worked for previously.  The patient got  all the way through the application process and they were ready to hire him but when his background check came back it highlighted a misdemeanor assault on a male charge that he is just now completing probation for.  This incident was a complicated situation and the patient is described in detail prior.  The other person involved in this incident where the patient was charged and convicted reportedly instigated this conflict and the patient initially was defending himself.  However, after the conflict 1 called the police and since she had a small mark on her the patient was charged with assault on a male.  The patient was not able to effectively defend himself against these charges and ended up getting convicted.  The patient has completed his probation now and is hoping to try to get this expunged from his record.  However, this resulted in him not getting the job.  Because he had done very well at the previous job that he left and was feeling better he went back to his previous employer and they had to come back immediately.  The patient reports that he is feeling much better about work at this point and attributes some of it to a period of time where is very hot at work and his air conditioning was broken at home and he also probably ended up with a mood swing.  The patient is also started a relationship with a woman in [place] both taking it very slow and I have encouraged him to take his time and developing this relationship which they are both designed to take very slow as the patient is very cautious about getting involved with someone else as the last time he was in a significant relationship it ended up with him getting  criminal charges and the time before was long marriage that ended with his wife leaving him the patient 1 not wanting to be divorced but to ended up becoming the primary parent for 3 children.  He has effectively been able to manage these parental responsibilities and does a very good  job with his children.  05/22/2021: The patient is continuing to do well taking care of his kids is the primary caretaker.  The patient reports that he has settled back into his return to his old job and is doing well there and not having the same issues he had over the summer.  The patient reports that the young woman that he had begun dating engaged in some significantly manipulative behavior recently and the patient ended up breaking off the relationship.  Given the patient's history and how he handled the situation this is a significant improvement in the way he responded.  The patient identified the maladaptive behavior and the woman that he had been seen and was able to appropriately in the relationship without obsessional thinking or trying to justify or control the situation.  The patient simply let her know that he was breaking off the relationship and began moving forward in his life.  09/27/2021: I have not seen the patient for several months now and reports that he has continued to work effectively at his job but acknowledges some ongoing intrusive thoughts now that we are at the 1 year anniversary of the death of his mother.  She was very important in his life and encouraged him to make all of the progress that he has had.  The patient admits to ongoing obsessional thinking about cars even though this is his occupation now he spent most of his free time thinking about working around cars as well.  The patient admits that this is had a deleterious effect on some other aspects of his life.  While the patient is maintaining the house he inherited after the death of his mother he is also spent most of the extra cash that he inherited and is distressed by that.  He got into some financial difficulties and got behind on his bills but is now steadily work to getting his bills caught up and is setting up structured patterns to keeping bills up-to-date.  The patient has acute onset of crying spells at times but  they tend to be short-lived and he gets focused on other things and often uses thinking about cars and other aspects as a way to avoid thinking about the passing of his mother.  We have continue to work on therapeutic interventions around his depression and anxiety/OCD symptoms and ongoing attentional deficits.  11/20/2021: The patient reports that he had a medical scare recently when he developed a lump and pain in his testicle with pain radiating up through his groin area.  The patient immediately began fearful that he had testicular cancer.  The patient reluctantly went to his PCP who then referred him to a urologist.  It turns out that he had a small cyst that was blocking proper function and an infection.  The patient talked about how it reminded him of his father developing multiple forms of cancer at the end of his father's life and the patient struggling with seeing his father diminished physically.  The patient reports that he began to obsess about fears of cancer and that he did not want to do chemo or radiation if he got cancer.  We addressed this issue specifically  today and talked about how cancer comes in many forms and strategies for treatment are all the same.  The patient tends to lock into a particular idea and have difficulty shifting creating poor coping strategies.  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  They get along for the most part and have discussed issues that happened previously.  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  The patient reports that he continues to have significant depression when thinking about his mother's death from COVID and  relating it to how sick he was and the fact that he did not die.  The patient continues to have guilty even though none is deserved.  The patient continues to be the primary caregiver for his children and the primary financial provider for his children.  Depressive symptoms have been less intense recently and for the most part he is managing to continue to work effectively.  08/09/2022: The patient came in today and because of holidays and no childcare he brought his 2 children with him.  The patient was in great distress returning today reporting that a teacher in his child's school had reported to child protective services that the children appeared to be wearing dirty close and reported concerns.  CPS did a home visit and everything went well but this still caused a great deal of stress for the patient.  He has worked very hard to be a good father for his kids but admitted that he has had a lot of ongoing depression around the death of his mother and has had difficulty moving forward.  He informed me that he had provided them with my name is someone that he has been seen and was okay if I talked with him.  The patient also talked about getting therapy for his kids as they are also struggling with the death of their grandmother who had been very important to the patient and his children.  I made a recommendation regarding outpatient behavioral health services provided through [facility] and if that did not work I would provide him with other names of referrals for outside the [facility] system.  There is someone in the [place] area but I am not sure of their expertise regarding care of children.  10/08/2022: The patient reports that he has been doing much better recently and is continue to work.  The patient reports that CPS has done and completed their formal review and found no issues with the patient and his parenting.  Patient admits that he has been very depressed and struggled with all of the responsibilities he  has had but given his efforts and his essentially sole responsibility at raising his 2 children this stress is understandable.  The patient has taken parenting classes and felt that it was very helpful for him and learning more effective strategies and care of his kids.  12/25/2022: The patient reports that he has begun a new relationship with his manager at work.  They are taken a very slow.  They have discussed it with other management at his job to make sure that there would not create a problem at work.  The patient is  looking at starting another place to work anyway but continues to do very well at this job.  He simply needs to find something that brings in more money as he has to manage taking care of his 2 children.  The situation with his kids has improved significantly and child protective services essentially signed off on everything and has no longer describing any concerns.  The patient did go to anger management classes and feels like it helped a lot.  03/28/2023: Patient returns today reporting that he continues to deal with depression and sleep disturbance but has had improvement overall.  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  The patient reports that this does bring back fond memories of his father and he really likes interacting with the public and plans to continue and build that business.  The patient reports that the relationship that he is started with someone that he had met through his other job continues to be quite positive.  He reports that he does have times of a lot of worry and anxiety but overall things have been improving.  Diagnosis:    Unspecified mood (affective) disorder (HCC)  Mixed obsessional thoughts and acts  OSA on CPAP         Electronically Signed   _______________________ Arley Phenix, Psy.D.

## 2023-04-01 ENCOUNTER — Ambulatory Visit: Payer: Medicaid Other | Admitting: Family Medicine

## 2023-04-01 ENCOUNTER — Encounter: Payer: Self-pay | Admitting: Family Medicine

## 2023-04-01 VITALS — BP 120/76 | HR 76 | Temp 97.9°F | Ht 66.0 in | Wt 212.0 lb

## 2023-04-01 DIAGNOSIS — E7849 Other hyperlipidemia: Secondary | ICD-10-CM | POA: Diagnosis not present

## 2023-04-01 DIAGNOSIS — Z0001 Encounter for general adult medical examination with abnormal findings: Secondary | ICD-10-CM

## 2023-04-01 DIAGNOSIS — E162 Hypoglycemia, unspecified: Secondary | ICD-10-CM

## 2023-04-01 DIAGNOSIS — Z0000 Encounter for general adult medical examination without abnormal findings: Secondary | ICD-10-CM

## 2023-04-01 NOTE — Progress Notes (Signed)
   Subjective:    Patient ID: [name], male    DOB: 04/29/1991, 32 y.o.   MRN: [id number]  HPI The patient comes in today for a wellness visit.    A review of their health history was completed.  A review of medications was also completed.  Any needed refills; none  Eating habits: good  Falls/  MVA accidents in past few months: none  Regular exercise: stays active at work and at home  Specialist pt sees on regular basis: neurology for Cpap, psychology  Preventative health issues were discussed.   Additional concerns:     Review of Systems     Objective:   Physical Exam  General-in no acute distress Eyes-no discharge Lungs-respiratory rate normal, CTA CV-no murmurs,RRR Extremities skin warm dry no edema Neuro grossly normal Behavior normal, alert  Patient doing very well working local job plus also working Engineer, mining & Plan:  Adult wellness-complete.wellness physical was conducted today. Importance of diet and exercise were discussed in detail.  Importance of stress reduction and healthy living were discussed.  In addition to this a discussion regarding safety was also covered.  We also reviewed over immunizations and gave recommendations regarding current immunization needed for age.   In addition to this additional areas were also touched on including: Preventative health exams needed:  Colonoscopy not indicated  Patient was advised yearly wellness exam  Hyperlipidemia healthy diet regular activity check lipids  Mild obesity portion control regular physical activity check glucose   Stress related issues being followed by psychology

## 2023-04-18 DIAGNOSIS — G4733 Obstructive sleep apnea (adult) (pediatric): Secondary | ICD-10-CM | POA: Diagnosis not present

## 2023-04-23 ENCOUNTER — Encounter: Payer: Self-pay | Admitting: Internal Medicine

## 2023-04-23 ENCOUNTER — Encounter: Payer: Medicaid Other | Admitting: Internal Medicine

## 2023-04-23 NOTE — Progress Notes (Signed)
Erroneous encounter - please disregard.

## 2023-05-02 ENCOUNTER — Encounter: Payer: Medicaid Other | Attending: Psychology | Admitting: Psychology

## 2023-05-02 DIAGNOSIS — F422 Mixed obsessional thoughts and acts: Secondary | ICD-10-CM | POA: Insufficient documentation

## 2023-05-02 DIAGNOSIS — G4733 Obstructive sleep apnea (adult) (pediatric): Secondary | ICD-10-CM | POA: Insufficient documentation

## 2023-05-02 DIAGNOSIS — F39 Unspecified mood [affective] disorder: Secondary | ICD-10-CM | POA: Insufficient documentation

## 2023-05-03 ENCOUNTER — Encounter: Payer: Self-pay | Admitting: Psychology

## 2023-05-03 NOTE — Progress Notes (Signed)
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 bereavement and getting his life back in functional working order.  The patient reports that he continues to have some lingering effects of his COVID-19 condition and continues to describe being more weak physically than normal and has persistent upper respiratory difficulties.  The patient has gotten his COVID-19 vaccine and is scheduled for his second shot to occur at the appropriate time.  The patient reports that he did feel very tired and achy the day after his vaccination shot but has bounced back from that day.  12/15/2020: The patient reports that he has fully returned back to work and he is gone back on a better pace schedule now that his boss is trusting him to be able to keep his focus and work level maintained.  The patient reports that he is done a lot of improvement as far as overall functioning.  The patient has been very careful about having a very slow build to relationship that he has been talking with and not repeat previous behaviors around relationships.  The patient reports that he continues to have sudden onset of crying spells and mood issues associated with when he thinks about his mother who died of COVID this  past winter.  The patient reports that he did have a significant event medically where he was found to be having seizure-like symptoms and weakness and his friend took him to the emergency department.  This all happened within a week of having his second COVID booster shot and he attributed it to that.  However, he also has a history of some migraines and these types of neurological events before so is unclear the exact etiological aspects.  The patient recovered while at the emergency department and has had no issues since. Today we continue to work on therapeutic interventions around better coping skills and strategies related to his mood disorder and obsessive thinking.  04/10/2021: The patient reports that he has had an episode where he became more agitated and irritable that affected his work situation significantly as well as impacting frustration at home and interactions with his children became more irritable.  The patient reports that it has improved over the past several days and he feels like he is back to himself.  However, during this episode the patient became increasingly frustrated at work and actually decided he was going to leave his employment and go back to work for a company he had worked for previously.  The patient got all the way through the application process and they were ready to hire him but when his background check came back it highlighted a misdemeanor assault on a male charge that he is just now completing probation for.  This incident was a complicated situation and the patient is described in detail prior.  The other person involved in this incident where the patient was charged and convicted reportedly instigated this conflict and the  patient initially was defending himself.  However, after the conflict 1 called the police and since she had a small mark on her the patient was charged with assault on a male.  The patient was not able to effectively defend himself against  these charges and ended up getting convicted.  The patient has completed his probation now and is hoping to try to get this expunged from his record.  However, this resulted in him not getting the job.  Because he had done very well at the previous job that he left and was feeling better he went back to his previous employer and they had to come back immediately.  The patient reports that he is feeling much better about work at this point and attributes some of it to a period of time where is very hot at work and his air conditioning was broken at home and he also probably ended up with a mood swing.  The patient is also started a relationship with a woman in [place] both taking it very slow and I have encouraged him to take his time and developing this relationship which they are both designed to take very slow as the patient is very cautious about getting involved with someone else as the last time he was in a significant relationship it ended up with him getting criminal charges and the time before was long marriage that ended with his wife leaving him the patient 1 not wanting to be divorced but to ended up becoming the primary parent for 3 children.  He has effectively been able to manage these parental responsibilities and does a very good job with his children.  05/22/2021: The patient is continuing to do well taking care of his kids is the primary caretaker.  The patient reports that he has settled back into his return to his old job and is doing well there and not having the same issues he had over the summer.  The patient reports that the young woman that he had begun dating engaged in some significantly manipulative behavior recently and the patient ended up breaking off the relationship.  Given the patient's history and how he handled the situation this is a significant improvement in the way he responded.  The patient identified the maladaptive behavior and the woman that he had been seen and was able  to appropriately in the relationship without obsessional thinking or trying to justify or control the situation.  The patient simply let her know that he was breaking off the relationship and began moving forward in his life.  09/27/2021: I have not seen the patient for several months now and reports that he has continued to work effectively at his job but acknowledges some ongoing intrusive thoughts now that we are at the 1 year anniversary of the death of his mother.  She was very important in his life and encouraged him to make all of the progress that he has had.  The patient admits to ongoing obsessional thinking about cars even though this is his occupation now he spent most of his free time thinking about working around cars as well.  The patient admits that this is had a deleterious effect on some other aspects of his life.  While the patient is maintaining the house he inherited after the death of his mother he is also spent most of the extra cash that he inherited and is distressed by that.  He got into some financial difficulties and got behind on  his bills but is now steadily work to getting his bills caught up and is setting up structured patterns to keeping bills up-to-date.  The patient has acute onset of crying spells at times but they tend to be short-lived and he gets focused on other things and often uses thinking about cars and other aspects as a way to avoid thinking about the passing of his mother.  We have continue to work on therapeutic interventions around his depression and anxiety/OCD symptoms and ongoing attentional deficits.  11/20/2021: The patient reports that he had a medical scare recently when he developed a lump and pain in his testicle with pain radiating up through his groin area.  The patient immediately began fearful that he had testicular cancer.  The patient reluctantly went to his PCP who then referred him to a urologist.  It turns out that he had a small cyst that was  blocking proper function and an infection.  The patient talked about how it reminded him of his father developing multiple forms of cancer at the end of his father's life and the patient struggling with seeing his father diminished physically.  The patient reports that he began to obsess about fears of cancer and that he did not want to do chemo or radiation if he got cancer.  We addressed this issue specifically today and talked about how cancer comes in many forms and strategies for treatment are all the same.  The patient tends to lock into a particular idea and have difficulty shifting creating poor coping strategies.  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  They get along for the most part and have discussed issues that happened previously.  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  The patient reports that he continues to have significant depression when thinking about his mother's death from COVID and relating it to how sick he was and the fact that he did not die.  The patient continues to have guilty even though none is deserved.  The patient continues to be the primary caregiver for his children and the primary financial provider for his children.  Depressive symptoms have been less intense recently and for the most part he is managing to continue to work effectively.  08/09/2022: The patient came in today and because of holidays and no childcare he brought his 2 children with him.  The patient was in great distress returning today reporting that a teacher in his child's school had reported to child protective services that the children appeared to be wearing  dirty close and reported concerns.  CPS did a home visit and everything went well but this still caused a great deal of stress for the patient.  He has worked very hard to be a good father for his kids but admitted that he has had a lot of ongoing depression around the death of his mother and has had difficulty moving forward.  He informed me that he had provided them with my name is someone that he has been seen and was okay if I talked with him.  The patient also talked about getting therapy for his kids as  they are also struggling with the death of their grandmother who had been very important to the patient and his children.  I made a recommendation regarding outpatient behavioral health services provided through [facility] and if that did not work I would provide him with other names of referrals for outside the [facility] system.  There is someone in the [place] area but I am not sure of their expertise regarding care of children.  10/08/2022: The patient reports that he has been doing much better recently and is continue to work.  The patient reports that CPS has done and completed their formal review and found no issues with the patient and his parenting.  Patient admits that he has been very depressed and struggled with all of the responsibilities he has had but given his efforts and his essentially sole responsibility at raising his 2 children this stress is understandable.  The patient has taken parenting classes and felt that it was very helpful for him and learning more effective strategies and care of his kids.  12/25/2022: The patient reports that he has begun a new relationship with his manager at work.  They are taken a very slow.  They have discussed it with other management at his job to make sure that there would not create a problem at work.  The patient is looking at starting another place to work anyway but continues to do very well at this job.  He simply needs to find something that brings in more  money as he has to manage taking care of his 2 children.  The situation with his kids has improved significantly and child protective services essentially signed off on everything and has no longer describing any concerns.  The patient did go to anger management classes and feels like it helped a lot.  03/28/2023: Patient returns today reporting that he continues to deal with depression and sleep disturbance but has had improvement overall.  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  The patient reports that this does bring back fond memories of his father and he really likes interacting with the public and plans to continue and build that business.  The patient reports that the relationship that he is started with someone that he had met through his other job continues to be quite positive.  He reports that he does have times of a lot of worry and anxiety but overall things have been improving.  Diagnosis:    Unspecified mood (affective) disorder (HCC)  Mixed obsessional thoughts and acts  OSA on CPAP         Electronically Signed   _______________________ Arley Phenix, Psy.D.

## 2023-05-12 NOTE — Progress Notes (Unsigned)
PATIENT: [name] DOB: 14-Apr-1991  REASON FOR VISIT: follow up HISTORY FROM: patient Chief Complaint  Patient presents with   Follow-up    Rm 9. Alone. He states he is sleeping better with the CPAP. His needs a new hose, his has a rip. He sleeps very little due to work schedule.     HISTORY OF PRESENT ILLNESS: Today 05/12/23:  [name] is a 32 y.o. male with a history of OSA on CPAP. Returns today for follow-up. Reports that he needs new supplies and needs access to be able to adjust his setting on his machine. Reports that CPAP is working well, doesn't like to sleep without it. No longer smoking      02/05/23: [name] is a 32 y.o. male patient who is here for revisit 02/05/2023  after a SPLIT night PSG from 01-29-2023 at [facility] .  Chief concern according to patient :  " I am CPAP dependent now" - Mr. Stonebreaker just celebrated his 32st birthday.  He underwent a split-night polysomnography and it was clear that he had some difficulty sleeping without CPAP.  He was fitted will be used his home interface which is a nasal [employer] BREVIDA nasal mask in medium to large size.  CPAP was started at 7 cm water (again with no EPR) and he could not tolerate a lower starting pressure.  He remained for the whole night at 7 cm of water with an resolution of AHI at 0.2/h.  The baseline part of the study revealed an AHI of 24.1/H, sleep efficiency was only 61.7 % and increased ,after CPAP was initiated again, to 96.3%  12/28/19: Mr. Krzywicki is a 32 year old male with a history of obstructive sleep apnea on CPAP.  His download indicates that he uses machine 25 out of 30 days for compliance of 83%.  He used his machine greater than 4 hours each night.  On average he uses his machine 6 hours and 54 minutes.  His residual AHI is 2 on 6-15 centimeters of water with EPR 2.  Leak in the 95th percentile is 25 L/min.  Reports CPAP is working well for him.  He returns today  for an evaluation    HISTORY 12/23/18:   Mr. Zornes is a 32 year old male with a history of obstructive sleep apnea on CPAP.  We completed a virtual visit for follow-up.  His CPAP download indicates that he uses machine 27 out of 30 days for compliance of 90%.  He uses machine greater than 4 hours each night.  On average he uses his machine 7 hours and 10 minutes.  His residual AHI is 1.3 on 6-15 cmH2O with EPR 3.  He does not have a significant leak.  He states that since we changed his pressure he has noted an improvement in his daytime sleepiness.  He still has some issues with sleepiness but it is improved.  He was working at [employer] but due to COVID-19 he is no longer employed.  He denies any new symptoms.  He returns today for evaluation.  REVIEW OF SYSTEMS: Out of a complete 14 system review of symptoms, the patient complains only of the following symptoms, and all other reviewed systems are negative.  FSS 40 ESS 9  ALLERGIES: Allergies  Allergen Reactions   Strattera [Atomoxetine Hcl] Other (See Comments)    CAUSED AGGRESSION   Sulfa Antibiotics Hives   Guava Flavor     HOME MEDICATIONS: Outpatient  Medications Prior to Visit  Medication Sig Dispense Refill   albuterol (VENTOLIN HFA) 108 (90 Base) MCG/ACT inhaler Inhale 1-2 puffs into the lungs every 6 (six) hours as needed for wheezing or shortness of breath. 18 g 3   fluticasone (FLONASE) 50 MCG/ACT nasal spray Place into both nostrils.     ibuprofen (ADVIL) 600 MG tablet Take 1 tablet (600 mg total) by mouth every 8 (eight) hours as needed. 60 tablet 3   loratadine (CLARITIN) 10 MG tablet Take 1 tablet (10 mg total) by mouth daily. 30 tablet 5   No facility-administered medications prior to visit.    PAST MEDICAL HISTORY: Past Medical History:  Diagnosis Date   ADHD (attention deficit hyperactivity disorder)    no current med.   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    right   OCD (obsessive compulsive disorder)    Oppositional defiant disorder    PTSD (post-traumatic stress disorder)    Seizures (HCC)    Stuffy nose 07/01/2017    PAST SURGICAL HISTORY: Past Surgical History:  Procedure Laterality Date   GANGLION CYST EXCISION Right 07/03/2017   Procedure: RIGHT HAND NEUROMA REMOVAL;  Surgeon: Tarry Kos, MD;  Location: [facility];  Service: Orthopedics;  Laterality: Right;   neuroma of hand removed     TENDON LENGTHENING Bilateral    as a child   TOOTH EXTRACTION      FAMILY HISTORY: Family History  Problem Relation Age of Onset   Diabetes Mother    Heart attack Father    Cancer Father    Alcohol abuse Paternal Aunt    Drug abuse Paternal Aunt    Bipolar disorder Cousin     SOCIAL HISTORY: Social History   Socioeconomic History   Marital status: Divorced    Spouse name: Not on file   Number of children: Not on file   Years of education: Not on file   Highest education level: Not on file  Occupational History   Not on file  Tobacco Use   Smoking status: Former    Current packs/day: 0.00    Types: Cigarettes    Quit date: 01/23/2022    Years since quitting: 1.2    Passive exposure: Past   Smokeless tobacco: Former    Types: Chew    Quit date: 02/12/2016  Vaping Use   Vaping status: Never Used  Substance and Sexual Activity   Alcohol use: Yes    Comment: occasional   Drug use: Yes    Types: Marijuana   Sexual activity: Yes    Partners: Female  Other Topics Concern   Not on file  Social History Narrative   Not on file   Social Determinants of Health   Financial Resource Strain: Not on file  Food Insecurity: Not on file  Transportation Needs: Not on file  Physical Activity: Not on file  Stress: Not on file  Social Connections: Not on file  Intimate Partner Violence: Not on file      PHYSICAL EXAM  Vitals:   05/13/23 1112  BP: 136/87  Pulse: 76  Weight: 211 lb (95.7 kg)  Height: 5\' 6"   (1.676 m)    Body mass index is 34.06 kg/m.  Generalized: Well developed, in no acute distress  Chest: Lungs clear to auscultation bilaterally  Neurological examination  Mentation: Alert oriented to time, place, history taking. Follows all commands speech and  language fluent Cranial nerve II-XII: Facial Symmetry noted Gait and station: Gait is normal.    DIAGNOSTIC DATA (LABS, IMAGING, TESTING) - I reviewed patient records, labs, notes, testing and imaging myself where available.  Lab Results  Component Value Date   WBC 6.5 12/27/2021   HGB 16.6 12/27/2021   HCT 48.1 12/27/2021   MCV 88 12/27/2021   PLT CANCELED 12/27/2021      Component Value Date/Time   NA 142 12/27/2021 1406   K 4.3 12/27/2021 1406   CL 103 12/27/2021 1406   CO2 25 12/27/2021 1406   GLUCOSE 103 (H) 04/01/2023 1007   GLUCOSE 124 (H) 07/05/2017 1417   BUN 15 12/27/2021 1406   CREATININE 0.73 (L) 12/27/2021 1406   CREATININE 0.66 09/22/2013 1050   CALCIUM 9.5 12/27/2021 1406   PROT 7.9 12/27/2021 1406   ALBUMIN 5.0 12/27/2021 1406   AST 22 12/27/2021 1406   ALT 34 12/27/2021 1406   ALKPHOS 67 12/27/2021 1406   BILITOT 0.2 12/27/2021 1406   GFRNONAA 128 01/31/2018 1149   GFRAA 148 01/31/2018 1149   Lab Results  Component Value Date   CHOL 260 (H) 04/01/2023   HDL 42 04/01/2023   LDLCALC 150 (H) 04/01/2023   TRIG 363 (H) 04/01/2023   CHOLHDL 6.2 (H) 04/01/2023   Lab Results  Component Value Date   HGBA1C 5.3 01/31/2018   No results found for: "VITAMINB12" Lab Results  Component Value Date   TSH 0.774 08/01/2017      ASSESSMENT AND PLAN 32 y.o. year old male  has a past medical history of ADHD (attention deficit hyperactivity disorder), Asthma, Depression, Migraines, Neuroma of hand (06/2017), OCD (obsessive compulsive disorder), Oppositional defiant disorder, PTSD (post-traumatic stress disorder), Seizures (HCC), and Stuffy nose (07/01/2017). here with:  OSA on CPAP  - CPAP  compliance excellent - Good treatment of AHI  - Encourage patient to use CPAP nightly and > 4 hours each night - order sent for new supplies - F/U in 1 year or sooner if needed   Butch Penny, MSN, NP-C 05/12/2023, 2:17 PM [facility] [address] [place], [place] [address] [phone]

## 2023-05-13 ENCOUNTER — Ambulatory Visit: Payer: Medicaid Other | Admitting: Adult Health

## 2023-05-13 ENCOUNTER — Encounter: Payer: Self-pay | Admitting: Adult Health

## 2023-05-13 VITALS — BP 136/87 | HR 76 | Ht 66.0 in | Wt 211.0 lb

## 2023-05-13 DIAGNOSIS — G4733 Obstructive sleep apnea (adult) (pediatric): Secondary | ICD-10-CM

## 2023-05-13 NOTE — Patient Instructions (Signed)
Continue using CPAP nightly and greater than 4 hours each night Order sent for new supplies If your symptoms worsen or you develop new symptoms please let us know.

## 2023-05-19 DIAGNOSIS — G4733 Obstructive sleep apnea (adult) (pediatric): Secondary | ICD-10-CM | POA: Diagnosis not present

## 2023-05-28 ENCOUNTER — Emergency Department (HOSPITAL_COMMUNITY): Payer: Medicaid Other

## 2023-05-28 ENCOUNTER — Other Ambulatory Visit: Payer: Self-pay

## 2023-05-28 ENCOUNTER — Emergency Department (HOSPITAL_COMMUNITY)
Admission: EM | Admit: 2023-05-28 | Discharge: 2023-05-28 | Discharge status: Against Medical Advice | Payer: Medicaid Other | Attending: Emergency Medicine | Admitting: Emergency Medicine

## 2023-05-28 DIAGNOSIS — R200 Anesthesia of skin: Secondary | ICD-10-CM | POA: Insufficient documentation

## 2023-05-28 DIAGNOSIS — R079 Chest pain, unspecified: Secondary | ICD-10-CM | POA: Insufficient documentation

## 2023-05-28 DIAGNOSIS — Z5321 Procedure and treatment not carried out due to patient leaving prior to being seen by health care provider: Secondary | ICD-10-CM | POA: Diagnosis not present

## 2023-05-28 DIAGNOSIS — R519 Headache, unspecified: Secondary | ICD-10-CM | POA: Insufficient documentation

## 2023-05-28 LAB — TROPONIN I (HIGH SENSITIVITY): Troponin I (High Sensitivity): 3 ng/L (ref <18)

## 2023-05-28 LAB — CBC
HCT: 47.9 % (ref 39.0–52.0)
Hemoglobin: 16.3 g/dL (ref 13.0–17.0)
MCH: 29.1 pg (ref 26.0–34.0)
MCHC: 34 g/dL (ref 30.0–36.0)
MCV: 85.4 fL (ref 80.0–100.0)
Platelets: 274 10*3/uL (ref 150–400)
RBC: 5.61 MIL/uL (ref 4.22–5.81)
RDW: 12.7 % (ref 11.5–15.5)
WBC: 9.3 10*3/uL (ref 4.0–10.5)
nRBC: 0 % (ref 0.0–0.2)

## 2023-05-28 LAB — BASIC METABOLIC PANEL
Anion gap: 12 (ref 5–15)
BUN: 14 mg/dL (ref 6–20)
CO2: 19 mmol/L — ABNORMAL LOW (ref 22–32)
Calcium: 9.5 mg/dL (ref 8.9–10.3)
Chloride: 105 mmol/L (ref 98–111)
Creatinine, Ser: 0.84 mg/dL (ref 0.61–1.24)
GFR, Estimated: >60 mL/min (ref >60)
Glucose, Bld: 101 mg/dL — ABNORMAL HIGH (ref 70–99)
Potassium: 4 mmol/L (ref 3.5–5.1)
Sodium: 136 mmol/L (ref 135–145)

## 2023-05-28 NOTE — ED Notes (Signed)
Pt said he needed to leave to get his children off the bus. Pt moved OTF.

## 2023-05-28 NOTE — ED Triage Notes (Signed)
Patient reports waking up this morning and having numbness over his whole body and having chest pain and headache. LKW 2200 when he went to bed. States hx of potassium deficiency and had these symptoms the last time it was low.

## 2023-05-29 ENCOUNTER — Telehealth: Payer: Self-pay | Admitting: Family Medicine

## 2023-05-29 NOTE — Telephone Encounter (Signed)
Patient  went Er yesterday with panic attack but didn't stay.He would like you to review test that were done and if needs follow up please let him know. He had to leave [facility] because he to get children off the bus in [place]

## 2023-05-29 NOTE — Telephone Encounter (Signed)
Patient has been informed per provider results and recommendations. ?

## 2023-05-29 NOTE — Telephone Encounter (Signed)
Chest x-ray was normal, EKG normal, cardiac enzyme was negative, lab work looked acceptable and normal Recommend follow-up office visit if ongoing concerns regarding symptoms  ER if having any emergent issues

## 2023-06-18 DIAGNOSIS — G4733 Obstructive sleep apnea (adult) (pediatric): Secondary | ICD-10-CM | POA: Diagnosis not present

## 2023-07-10 ENCOUNTER — Encounter: Payer: Medicaid Other | Attending: Psychology | Admitting: Psychology

## 2023-07-10 DIAGNOSIS — F422 Mixed obsessional thoughts and acts: Secondary | ICD-10-CM | POA: Diagnosis not present

## 2023-07-10 DIAGNOSIS — F39 Unspecified mood [affective] disorder: Secondary | ICD-10-CM | POA: Diagnosis not present

## 2023-07-10 DIAGNOSIS — G4733 Obstructive sleep apnea (adult) (pediatric): Secondary | ICD-10-CM | POA: Diagnosis not present
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
## 2023-07-19 DIAGNOSIS — G4733 Obstructive sleep apnea (adult) (pediatric): Secondary | ICD-10-CM | POA: Diagnosis not present

## 2023-07-28 DIAGNOSIS — G4733 Obstructive sleep apnea (adult) (pediatric): Secondary | ICD-10-CM | POA: Diagnosis not present

## 2023-08-18 DIAGNOSIS — G4733 Obstructive sleep apnea (adult) (pediatric): Secondary | ICD-10-CM | POA: Diagnosis not present

## 2023-09-02 ENCOUNTER — Ambulatory Visit: Payer: Medicaid Other | Admitting: Physician Assistant

## 2023-09-02 ENCOUNTER — Encounter: Payer: Self-pay | Admitting: Physician Assistant

## 2023-09-02 ENCOUNTER — Ambulatory Visit (HOSPITAL_COMMUNITY)
Admission: RE | Admit: 2023-09-02 | Discharge: 2023-09-02 | Disposition: A | Discharge status: Home / Self Care | Payer: Medicaid Other | Source: Ambulatory Visit | Attending: Physician Assistant | Admitting: Physician Assistant

## 2023-09-02 VITALS — BP 129/82 | Ht 66.0 in | Wt 210.0 lb

## 2023-09-02 DIAGNOSIS — M79671 Pain in right foot: Secondary | ICD-10-CM

## 2023-09-02 DIAGNOSIS — Z113 Encounter for screening for infections with a predominantly sexual mode of transmission: Secondary | ICD-10-CM

## 2023-09-02 DIAGNOSIS — M7731 Calcaneal spur, right foot: Secondary | ICD-10-CM | POA: Diagnosis not present

## 2023-09-02 DIAGNOSIS — R051 Acute cough: Secondary | ICD-10-CM | POA: Diagnosis not present

## 2023-09-02 NOTE — Progress Notes (Signed)
 Acute Office Visit  Subjective:     Patient ID: [name], male    DOB: 04-Aug-1991, 33 y.o.   MRN: [id number]   HPI Patient is in today for evaluation of right foot pain.  Patient states the pain has been ongoing for years but over the last few months has worsened.  He reports most of his pain is in his right heel.  He states some days pain is great enough to cause discomfort with walking.  He describes pain as shooting pain with weightbearing.  He does report use of shoe inserts as well as massage and stretching for pain relief.  Patient also reports cough that has been ongoing for the last week or so.  She denies fevers, shortness of breath, other URI symptoms.  Patient also requests STI screening today, but denies current symptoms.  Review of Systems  Constitutional:  Negative for chills, fever and weight loss.  Respiratory:  Positive for cough and wheezing. Negative for shortness of breath.   Cardiovascular:  Negative for chest pain and palpitations.  Genitourinary:  Negative for dysuria.  Musculoskeletal:  Negative for joint pain and myalgias.       Foot pain       Objective:     BP 129/82   Ht 5' 6 (1.676 m)   Wt 210 lb (95.3 kg)   BMI 33.89 kg/m   Physical Exam Vitals reviewed.  Constitutional:      General: He is not in acute distress.    Appearance: Normal appearance.  HENT:     Nose: Nose normal.     Mouth/Throat:     Mouth: Mucous membranes are moist.     Pharynx: Oropharynx is clear.  Eyes:     Extraocular Movements: Extraocular movements intact.     Conjunctiva/sclera: Conjunctivae normal.  Cardiovascular:     Rate and Rhythm: Normal rate and regular rhythm.     Heart sounds: No murmur heard.    No friction rub. No gallop.  Pulmonary:     Effort: Pulmonary effort is normal.     Breath sounds: No stridor. Wheezing present. No rhonchi or rales.  Musculoskeletal:        General: Normal range of motion.     Right foot: Normal range of motion. No  swelling, tenderness or bony tenderness.  Feet:     Right foot:     Skin integrity: No erythema or warmth.  Lymphadenopathy:     Cervical: No cervical adenopathy.  Skin:    General: Skin is warm and dry.     Capillary Refill: Capillary refill takes less than 2 seconds.  Neurological:     General: No focal deficit present.     Mental Status: He is alert and oriented to person, place, and time.  Psychiatric:        Mood and Affect: Mood normal.        Behavior: Behavior normal.     No results found for any visits on 09/02/23.      Assessment & Plan:  Right foot pain -     DG Foot Complete Right  Acute cough  Screen for STD (sexually transmitted disease) -     RPR -     HIV Antibody (routine testing w rflx) -     DG Foot Complete Right -     Chlamydia/Gonococcus/Trichomonas, NAA   Patient appears stable today.  Physical exam without abnormal findings.  Foot exam negative for tenderness, patient able to  weight-bear without pain while ambulating in and out of office visit today.  Right chest x-ray ordered today to evaluate for acute bony pathology.  Likely plantar fasciitis.  Patient advised ibuprofen  in the morning and at night, supportive shoes/inserts, stretching and icing for symptom relief.  Lungs clear to auscultation bilaterally, slight wheeze in right lung field cleared with cough.  Likely viral in nature, as he has no other signs for systemic or bacterial infection.  Patient encouraged to continue with over-the-counter and symptomatic management.  Full STI panel ordered today per patient request.  Patient will follow-up as needed if symptoms worsen.  Return if symptoms worsen or fail to improve.  Charmaine Hadar Elgersma, PA-C

## 2023-09-03 DIAGNOSIS — M62838 Other muscle spasm: Secondary | ICD-10-CM | POA: Diagnosis not present

## 2023-09-03 DIAGNOSIS — R519 Headache, unspecified: Secondary | ICD-10-CM | POA: Diagnosis not present

## 2023-09-03 DIAGNOSIS — J209 Acute bronchitis, unspecified: Secondary | ICD-10-CM | POA: Diagnosis not present

## 2023-09-03 LAB — HIV ANTIBODY (ROUTINE TESTING W REFLEX): HIV Screen 4th Generation wRfx: NONREACTIVE

## 2023-09-03 LAB — RPR: RPR Ser Ql: NONREACTIVE

## 2023-09-04 ENCOUNTER — Other Ambulatory Visit: Payer: Self-pay

## 2023-09-04 ENCOUNTER — Telehealth: Payer: Self-pay

## 2023-09-04 LAB — CHLAMYDIA/GONOCOCCUS/TRICHOMONAS, NAA
Chlamydia by NAA: NEGATIVE
Gonococcus by NAA: NEGATIVE
Trich vag by NAA: NEGATIVE

## 2023-09-04 MED ORDER — ALBUTEROL SULFATE HFA 108 (90 BASE) MCG/ACT IN AERS: 1.0000 | INHALATION_SPRAY | Freq: Four times a day (QID) | RESPIRATORY_TRACT | 3 refills | Status: DC | PRN

## 2023-09-04 NOTE — Telephone Encounter (Signed)
 Prescription Request  09/04/2023  LOV: Visit date not found  What is the name of the medication or equipment? albuterol  (VENTOLIN  HFA) 108 (90 Base) MCG/ACT inhaler   Have you contacted your pharmacy to request a refill? Yes   Which pharmacy would you like this sent to?  [facility] [phone] - [place],  - [address] AT [facility] OF [address] [place] [address] Phone: [phone] Fax: [phone]    Patient notified that their request is being sent to the clinical staff for review and that they should receive a response within 2 business days.   Please advise at Mobile [phone] (mobile)

## 2023-09-18 DIAGNOSIS — G4733 Obstructive sleep apnea (adult) (pediatric): Secondary | ICD-10-CM | POA: Diagnosis not present

## 2023-09-30 ENCOUNTER — Telehealth: Payer: Self-pay

## 2023-09-30 MED ORDER — IBUPROFEN 600 MG PO TABS: 600.0000 mg | ORAL_TABLET | Freq: Three times a day (TID) | ORAL | 3 refills | Status: DC | PRN

## 2023-09-30 NOTE — Telephone Encounter (Signed)
 Prescription Request  09/30/2023  LOV: Visit date not found  What is the name of the medication or equipment? ibuprofen  (ADVIL ) 600 MG tablet   Have you contacted your pharmacy to request a refill? Yes   Which pharmacy would you like this sent to?  [facility] [phone] - [place], [place] - [address] AT [facility] OF [address] [place] [address] Phone: [phone] Fax: [phone]    Patient notified that their request is being sent to the clinical staff for review and that they should receive a response within 2 business days.   Please advise at Mobile [phone] (mobile)

## 2023-10-18 DIAGNOSIS — G4733 Obstructive sleep apnea (adult) (pediatric): Secondary | ICD-10-CM | POA: Diagnosis not present

## 2023-11-16 DIAGNOSIS — G4733 Obstructive sleep apnea (adult) (pediatric): Secondary | ICD-10-CM | POA: Diagnosis not present

## 2023-11-18 ENCOUNTER — Ambulatory Visit: Admitting: Physician Assistant

## 2023-11-18 ENCOUNTER — Encounter: Payer: Self-pay | Admitting: Physician Assistant

## 2023-11-18 VITALS — BP 138/85 | HR 59 | Temp 98.6°F | Ht 66.0 in | Wt 208.6 lb

## 2023-11-18 DIAGNOSIS — M722 Plantar fascial fibromatosis: Secondary | ICD-10-CM | POA: Diagnosis not present

## 2023-11-18 DIAGNOSIS — M7731 Calcaneal spur, right foot: Secondary | ICD-10-CM

## 2023-11-18 MED ORDER — PREDNISONE 20 MG PO TABS
40.0000 mg | ORAL_TABLET | Freq: Every day | ORAL | 0 refills | Status: AC
Start: 2023-11-18 — End: 2023-11-23

## 2023-11-18 NOTE — Progress Notes (Signed)
   Acute Office Visit  Subjective:     Patient ID: [name], male    DOB: 08-27-1990, 33 y.o.   MRN: [id number]   Patient presents today for follow up regarding right foot pain. X-ray in January revealed right heel spur.  He reports pain is now daily compared to previous intermittent pain.  She reports symptomatic care to include icing, massage, stretching, NSAIDs, and shoe inserts.  She describes pain as a numbing sharp/stabbing pain.  He reports pain gets worse throughout the day.  He denies numbness, tingling, or weakness.     Review of Systems  Musculoskeletal:  Negative for joint pain, myalgias and neck pain.       Right foot pain  Neurological:  Negative for tingling, sensory change and weakness.        Objective:     BP 138/85   Pulse (!) 59   Temp 98.6 F (37 C)   Ht 5\' 6"  (1.676 m)   Wt 208 lb 9.6 oz (94.6 kg)   SpO2 100%   BMI 33.67 kg/m   Physical Exam Vitals reviewed.  Constitutional:      General: He is not in acute distress.    Appearance: Normal appearance.  HENT:     Nose: Nose normal.     Mouth/Throat:     Mouth: Mucous membranes are moist.     Pharynx: Oropharynx is clear.  Eyes:     Extraocular Movements: Extraocular movements intact.     Conjunctiva/sclera: Conjunctivae normal.  Cardiovascular:     Rate and Rhythm: Normal rate and regular rhythm.     Heart sounds: Normal heart sounds. No murmur heard. Pulmonary:     Effort: Pulmonary effort is normal.     Breath sounds: Normal breath sounds.  Musculoskeletal:        General: Normal range of motion.     Right foot: Normal range of motion. Tenderness present. No swelling, deformity or foot drop. Normal pulse.  Feet:     Right foot:     Skin integrity: No ulcer, skin breakdown, erythema or warmth.  Skin:    General: Skin is warm and dry.  Neurological:     General: No focal deficit present.     Mental Status: He is alert and oriented to person, place, and time.     No results  found for any visits on 11/18/23.      Assessment & Plan:  Calcaneal spur of right foot -     Ambulatory referral to Orthopedics  Plantar fasciitis -     predniSONE; Take 2 tablets (40 mg total) by mouth daily for 5 days.  Dispense: 10 tablet; Refill: 0 -     Ambulatory referral to Orthopedics   Patient stable today. Symptoms likely related to worsening plantar fasciitis or heel spur. Advised continued icing, massage stretching, and use of shoe inserts. He should continue ibuprofen as needed for pain. % days burst of prednisone for potential inflammation and irritation. Referral to orthopedics for further evaluation and management of heel spur. Warning signs reviewed. Patient agreeable to plan.   Return if symptoms worsen or fail to improve.  Toni Amend Venessa Wickham, PA-C

## 2023-11-19 ENCOUNTER — Encounter: Payer: Self-pay | Admitting: Physician Assistant

## 2023-11-25 ENCOUNTER — Ambulatory Visit: Admitting: Orthopedic Surgery

## 2023-11-25 ENCOUNTER — Ambulatory Visit: Payer: Medicaid Other | Admitting: Family Medicine

## 2023-11-25 ENCOUNTER — Encounter: Payer: Self-pay | Admitting: Orthopedic Surgery

## 2023-11-25 VITALS — BP 138/89 | HR 80 | Ht 66.0 in | Wt 210.0 lb

## 2023-11-25 DIAGNOSIS — G5761 Lesion of plantar nerve, right lower limb: Secondary | ICD-10-CM

## 2023-11-25 DIAGNOSIS — M722 Plantar fascial fibromatosis: Secondary | ICD-10-CM | POA: Diagnosis not present

## 2023-11-25 MED ORDER — PREDNISONE 10 MG PO TABS
10.0000 mg | ORAL_TABLET | Freq: Three times a day (TID) | ORAL | 0 refills | Status: DC
Start: 2023-11-25 — End: 2023-12-16

## 2023-11-25 MED ORDER — METHYLPREDNISOLONE ACETATE 40 MG/ML IJ SUSP
40.0000 mg | Freq: Once | INTRAMUSCULAR | Status: AC
Start: 2023-11-25 — End: 2023-11-25
  Administered 2023-11-25: 40 mg via INTRA_ARTICULAR

## 2023-11-25 NOTE — Patient Instructions (Signed)
 Order TULI heel cups from [facility]

## 2023-11-25 NOTE — Progress Notes (Signed)
 Patient ID: [name], male   DOB: 12-22-1990, 33 y.o.   MRN: [id number]  Chief Complaint  Patient presents with   Foot Pain    Right heel    Encounter Diagnoses  Name Primary?   Plantar fasciitis of right foot Yes   Neuropathy of right lateral plantar nerve     Assessment and plan plantar fasciitis  Recommend Tuli heel cup Injection Continue stretching Continue ice Continue prednisone  Meds ordered this encounter  Medications   predniSONE (DELTASONE) 10 MG tablet    Sig: Take 1 tablet (10 mg total) by mouth 3 (three) times daily for 21 days.    Dispense:  63 tablet    Refill:  0    Procedure note Inject plantar fascia    Timeout was completed to confirm the site of injection right foot  The medications used were 40 mg of Depo-Medrol and 1% lidocaine 3 cc  Anesthesia was provided by ethyl chloride and the skin was prepped with alcohol.  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  He describes pain when he gets out of bed first thing in the morning pain during the day when he standing and pain after sitting and getting back up.  He has been treated with anti-inflammatory medication as well as prednisone.  He tried ice, stretching and a soft heel cup without relief he is here for evaluation and management of ongoing plantar heel pain  Review of systems he has some pain along the lateral border of the right foot as well.  Past Medical History:  Diagnosis Date   ADHD (attention deficit hyperactivity disorder)    no current med.   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  He is a little bit anxious but that his his normal state  Pulses and perfusion are normal good color and capillary refill to the right foot gait and station revealed he is walking on his metatarsals with the heel elevated.  Inspection reveals tenderness on the plantar aspect of the heel and lateral border of the foot which may be an indication of some lateral plantar nerve irritation ankle range of motion remains relatively normal and the ankle is stable there is no atrophy normal muscle tone skin normal as well.  Outside imaging  3 views of the right foot he does have a small plantar spur The remaining portions of the foot show no abnormalities

## 2023-11-25 NOTE — Addendum Note (Signed)
 Addended byCaffie Damme on: 11/25/2023 11:07 AM   Modules accepted: Orders

## 2023-11-25 NOTE — Progress Notes (Signed)
  Intake history:  BP 138/89   Pulse 80   Ht 5\' 6"  (1.676 m)   Wt 210 lb (95.3 kg)   BMI 33.89 kg/m  Body mass index is 33.89 kg/m.    WHAT ARE WE SEEING YOU FOR TODAY?   right  foot/feet  How long has this bothered you? (DOI?DOS?WS?)  Accident in November/ pain worse since January   Anticoag.  No  Diabetes No  Heart disease No  Hypertension No  SMOKING HX No  Kidney disease No  Any ALLERGIES ______________________________________________   Treatment:  Have you taken:  Tylenol No  Advil Yes  Had PT No  Had injection No  Other  _____________history of Achilles tendon release as a child by Dr Romeo Apple has iced and used ibuprofen since Jan getting worse has been unable to [facility] on his right heel due to pain ____________

## 2023-12-10 ENCOUNTER — Encounter: Payer: Medicaid Other | Attending: Psychology | Admitting: Psychology

## 2023-12-10 DIAGNOSIS — F422 Mixed obsessional thoughts and acts: Secondary | ICD-10-CM | POA: Diagnosis not present

## 2023-12-10 DIAGNOSIS — F39 Unspecified mood [affective] disorder: Secondary | ICD-10-CM | POA: Diagnosis not present

## 2023-12-10 DIAGNOSIS — G4733 Obstructive sleep apnea (adult) (pediatric): Secondary | ICD-10-CM | POA: Insufficient documentation

## 2023-12-16 ENCOUNTER — Ambulatory Visit: Admitting: Orthopedic Surgery

## 2023-12-16 ENCOUNTER — Encounter: Payer: Self-pay | Admitting: Orthopedic Surgery

## 2023-12-16 DIAGNOSIS — M722 Plantar fascial fibromatosis: Secondary | ICD-10-CM | POA: Diagnosis not present

## 2023-12-16 NOTE — Patient Instructions (Addendum)
 If it gets severe again Call us  to let us  know you need another injection   Take ibuprofen  and keep stretching

## 2023-12-16 NOTE — Progress Notes (Signed)
   There were no vitals taken for this visit.  There is no height or weight on file to calculate BMI.  Chief Complaint  Patient presents with   Foot Pain    No diagnosis found.  DOI/DOS/ Date:    Improved

## 2023-12-16 NOTE — Progress Notes (Signed)
   Encounter Diagnosis  Name Primary?   Plantar fasciitis of right foot Yes    Chief Complaint  Patient presents with   Foot Pain    [name] continues to improve although he still having some residual symptoms status post injection, icing, stretching and a dose of steroids and then ibuprofen   He says he is much better but at the end of the day if he sits back down and gets back up he has some pain  Exam shows he is walking with no limp he has minimal tenderness over the plantar aspect of his heel his heel cord stretching seems to be going well  Recommend ibuprofen  before work then at the end of the day as needed continue stretching Continue heel protection Continue ice  Return in 6 weeks or sooner if he needs another injection

## 2023-12-17 DIAGNOSIS — G4733 Obstructive sleep apnea (adult) (pediatric): Secondary | ICD-10-CM | POA: Diagnosis not present
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
## 2024-01-24 DIAGNOSIS — G4733 Obstructive sleep apnea (adult) (pediatric): Secondary | ICD-10-CM | POA: Diagnosis not present

## 2024-01-27 ENCOUNTER — Encounter: Payer: Self-pay | Admitting: Orthopedic Surgery

## 2024-01-27 ENCOUNTER — Ambulatory Visit: Admitting: Orthopedic Surgery

## 2024-01-27 DIAGNOSIS — M722 Plantar fascial fibromatosis: Secondary | ICD-10-CM | POA: Diagnosis not present

## 2024-01-27 MED ORDER — METHYLPREDNISOLONE ACETATE 40 MG/ML IJ SUSP
40.0000 mg | Freq: Once | INTRAMUSCULAR | Status: AC
Start: 2024-01-27 — End: 2024-01-27
  Administered 2024-01-27: 40 mg via INTRA_ARTICULAR

## 2024-01-27 NOTE — Progress Notes (Signed)
   There were no vitals taken for this visit.  There is no height or weight on file to calculate BMI.  Chief Complaint  Patient presents with   Foot Pain    Right     No diagnosis found.  DOI/DOS/ Date: ongoing   Improved

## 2024-01-27 NOTE — Progress Notes (Signed)
   Chief Complaint  Patient presents with   Foot Pain    Right     Encounter Diagnosis  Name Primary?   Plantar fasciitis of right foot Yes    DOI/DOS/ Date: ongoing   Improved  32 year old male status post ongoing treatment for plantar fasciitis including injection and routine measures such as ice, stretching, anti-inflammatories  [name] is stating that he is better than he was back on April 7 but he still having some start up pain in the morning  He did get the [place] tight heel cups and he says he is icing and stretching  After discussion we decided to repeat the injection  Procedure note Inject plantar fascia    Timeout was completed to confirm the site of injection RIGHT HEEL   The medications used were 40 mg of Depo-Medrol  and 1% lidocaine  3 cc  Anesthesia was provided by ethyl chloride and the skin was prepped with alcohol.  After cleaning the skin with alcohol a 25-gauge needle was used to inject the plantar fascia, no complications were noted sterile bandage was applied   FU 6 WKS

## 2024-01-27 NOTE — Addendum Note (Signed)
 Addended byArla Lab on: 01/27/2024 09:35 AM   Modules accepted: Orders

## 2024-01-27 NOTE — Patient Instructions (Signed)
 Continue ice  Continue stretching  Continue anti-inflammatory ibuprofen 

## 2024-03-09 ENCOUNTER — Ambulatory Visit: Admitting: Orthopedic Surgery

## 2024-03-09 DIAGNOSIS — M722 Plantar fascial fibromatosis: Secondary | ICD-10-CM | POA: Diagnosis not present

## 2024-03-09 NOTE — Progress Notes (Signed)
   Chief Complaint  Patient presents with   Follow-up    Recheck on right heel pain     Encounter Diagnosis  Name Primary?   Plantar fasciitis of right foot Yes    DOI/DOS/ Date: N/A  Improved  Still has a little pain if walking in say [name]   Responding well to IBUPROFEN    Physical Exam Vitals and nursing note reviewed.  Constitutional:      Appearance: Normal appearance.  HENT:     Head: Normocephalic and atraumatic.  Eyes:     General: No scleral icterus.       Right eye: No discharge.        Left eye: No discharge.     Extraocular Movements: Extraocular movements intact.     Conjunctiva/sclera: Conjunctivae normal.     Pupils: Pupils are equal, round, and reactive to light.  Cardiovascular:     Rate and Rhythm: Normal rate.     Pulses: Normal pulses.  Musculoskeletal:     Right foot: Normal range of motion and normal capillary refill. Tenderness present. No swelling, deformity, bunion, Charcot foot, foot drop, prominent metatarsal heads, laceration or crepitus. Normal pulse.  Skin:    General: Skin is warm and dry.     Capillary Refill: Capillary refill takes less than 2 seconds.  Neurological:     General: No focal deficit present.     Mental Status: He is alert and oriented to person, place, and time.  Psychiatric:        Mood and Affect: Mood normal.        Behavior: Behavior normal.        Thought Content: Thought content normal.        Judgment: Judgment normal.    Stable, continue current course, return 4 weeks

## 2024-03-09 NOTE — Progress Notes (Signed)
   There were no vitals taken for this visit.  There is no height or weight on file to calculate BMI.  Chief Complaint  Patient presents with   Follow-up    Recheck on right heel pain     No diagnosis found.  DOI/DOS/ Date: N/A  Improved

## 2024-04-06 ENCOUNTER — Ambulatory Visit: Admitting: Orthopedic Surgery

## 2024-04-08 ENCOUNTER — Encounter: Payer: Self-pay | Admitting: Psychology

## 2024-04-08 ENCOUNTER — Encounter: Payer: Medicaid Other | Attending: Psychology | Admitting: Psychology

## 2024-04-08 DIAGNOSIS — F39 Unspecified mood [affective] disorder: Secondary | ICD-10-CM | POA: Diagnosis not present

## 2024-04-08 DIAGNOSIS — F422 Mixed obsessional thoughts and acts: Secondary | ICD-10-CM | POA: Insufficient documentation

## 2024-04-08 DIAGNOSIS — G4733 Obstructive sleep apnea (adult) (pediatric): Secondary | ICD-10-CM | POA: Diagnosis not present
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
## 2024-04-27 ENCOUNTER — Encounter: Payer: Self-pay | Admitting: Orthopedic Surgery

## 2024-04-27 ENCOUNTER — Ambulatory Visit: Admitting: Orthopedic Surgery

## 2024-04-27 DIAGNOSIS — G5761 Lesion of plantar nerve, right lower limb: Secondary | ICD-10-CM | POA: Diagnosis not present

## 2024-04-27 DIAGNOSIS — M722 Plantar fascial fibromatosis: Secondary | ICD-10-CM

## 2024-04-27 MED ORDER — IBUPROFEN 600 MG PO TABS: 600.0000 mg | ORAL_TABLET | Freq: Three times a day (TID) | ORAL | 3 refills | Status: AC | PRN

## 2024-04-27 MED ORDER — METHYLPREDNISOLONE ACETATE 40 MG/ML IJ SUSP
40.0000 mg | Freq: Once | INTRAMUSCULAR | Status: AC
Start: 2024-04-27 — End: 2024-04-27
  Administered 2024-04-27: 40 mg via INTRA_ARTICULAR

## 2024-04-27 NOTE — Progress Notes (Signed)
 FOLLOW-UP OFFICE VISIT   Patient: [name]           Date of Birth: [birth year]           MRN: [id number] Visit Date: 04/27/2024 Requested by: Alphonsa Glendia LABOR, MD [address] [place],  [place] [address] PCP: Alphonsa Glendia LABOR, MD    Encounter Diagnoses  Name Primary?   Plantar fasciitis of right foot Yes   Neuropathy of right lateral plantar nerve     Chief Complaint  Patient presents with   Foot Pain    Right/ heel still painful/ improved in beginning of treatment has good days bad days, but progress has stalled    32 year old male ongoing treatment plantar fasciitis right foot with improvement from initial presentation back in April 2025 he had 2 injections.  He is wearing appropriate heel cups performing stretching exercises using ice and ibuprofen .  The patient says he has good and bad days but he has not been able to rid himself of the pain  I told him there are surgical options which she is not interested in at this point  Recommend repeat injection  ASSESSMENT AND PLAN Refractory plantar fasciitis  Third injection plantar fascia  Continue exercises ice and appropriate footwear  Return in 6 weeks Procedure note Inject plantar fascia    Timeout was completed to confirm the site of injection right heel  The medications used were 40 mg of Depo-Medrol  and 1% lidocaine  3 cc  Anesthesia was provided by ethyl chloride and the skin was prepped with alcohol.  After cleaning the skin with alcohol a 25-gauge needle was used to inject the plantar fascia, no complications were noted sterile bandage was applied

## 2024-04-27 NOTE — Patient Instructions (Signed)
 Ice   Exercises  Heel cups   Ibuprofen 

## 2024-05-18 ENCOUNTER — Ambulatory Visit: Payer: Medicaid Other | Admitting: Adult Health

## 2024-05-18 ENCOUNTER — Encounter: Payer: Self-pay | Admitting: Adult Health

## 2024-05-18 VITALS — BP 129/81 | HR 69 | Ht 66.0 in | Wt 206.0 lb

## 2024-05-18 DIAGNOSIS — G4733 Obstructive sleep apnea (adult) (pediatric): Secondary | ICD-10-CM

## 2024-05-18 NOTE — Patient Instructions (Signed)
 Continue using CPAP nightly and greater than 4 hours each night If your symptoms worsen or you develop new symptoms please let us  know.

## 2024-05-18 NOTE — Progress Notes (Signed)
 PATIENT: [name] DOB: 1991/05/10  REASON FOR VISIT: follow up HISTORY FROM: patient Chief Complaint  Patient presents with   Follow-up    Pt in 18 alone Pt here for cpap f/u Pt states this am mask was on  however hose was disconnected from mask      HISTORY OF PRESENT ILLNESS: Today 05/18/24:  [name] is a 33 y.o. male with a history of obstructive sleep apnea on CPAP. Returns today for follow-up.  Reports that CPAP is working well.  He denies any new symptoms.  States that the tubing will not fit in the machine correctly.  He is having to use tape.  He also states that the automatic shut off feature is on.  He would like to be able to adjust this.  His download is below     05/13/23: [name] is a 33 y.o. male with a history of OSA on CPAP. Returns today for follow-up. Reports that he needs new supplies and needs access to be able to adjust his setting on his machine. Reports that CPAP is working well, doesn't like to sleep without it. No longer smoking      02/05/23: [name] is a 33 y.o. male patient who is here for revisit 02/05/2023  after a SPLIT night PSG from 01-29-2023 at [facility]. .  Chief concern according to patient :   I am CPAP dependent now - Mr. Winkles just celebrated his 32st birthday.  He underwent a split-night polysomnography and it was clear that he had some difficulty sleeping without CPAP.  He was fitted will be used his home interface which is a nasal [employer] BREVIDA nasal mask in medium to large size.  CPAP was started at 7 cm water (again with no EPR) and he could not tolerate a lower starting pressure.  He remained for the whole night at 7 cm of water with an resolution of AHI at 0.2/h.  The baseline part of the study revealed an AHI of 24.1/H, sleep efficiency was only 61.7 % and increased ,after CPAP was initiated again, to 96.3%  12/28/19: Mr. Wattenbarger is a 33 year old male with a history of obstructive  sleep apnea on CPAP.  His download indicates that he uses machine 25 out of 30 days for compliance of 83%.  He used his machine greater than 4 hours each night.  On average he uses his machine 6 hours and 54 minutes.  His residual AHI is 2 on 6-15 centimeters of water with EPR 2.  Leak in the 95th percentile is 25 L/min.  Reports CPAP is working well for him.  He returns today for an evaluation    HISTORY 12/23/18:   Mr. Sieling is a 33 year old male with a history of obstructive sleep apnea on CPAP.  We completed a virtual visit for follow-up.  His CPAP download indicates that he uses machine 27 out of 30 days for compliance of 90%.  He uses machine greater than 4 hours each night.  On average he uses his machine 7 hours and 10 minutes.  His residual AHI is 1.3 on 6-15 cmH2O with EPR 3.  He does not have a significant leak.  He states that since we changed his pressure he has noted an improvement in his daytime sleepiness.  He still has some issues with sleepiness but it is improved.  He was working at [employer] but due to COVID-19 he is no longer employed.  He denies any new symptoms.  He returns today for evaluation.  REVIEW OF SYSTEMS: Out of a complete 14 system review of symptoms, the patient complains only of the following symptoms, and all other reviewed systems are negative.  FSS 40 ESS 9  ALLERGIES: Allergies  Allergen Reactions   Strattera [Atomoxetine Hcl] Other (See Comments)    CAUSED AGGRESSION   Sulfa Antibiotics Hives   Guava Flavoring Agent (Non-Screening)    Other Rash    Allergic to nickle    HOME MEDICATIONS: Outpatient Medications Prior to Visit  Medication Sig Dispense Refill   albuterol  (VENTOLIN  HFA) 108 (90 Base) MCG/ACT inhaler Inhale 1-2 puffs into the lungs every 6 (six) hours as needed for wheezing or shortness of breath. 18 g 3   fluticasone (FLONASE) 50 MCG/ACT nasal spray Place into both nostrils.     ibuprofen  (ADVIL ) 600 MG tablet Take 1 tablet  (600 mg total) by mouth every 8 (eight) hours as needed. 90 tablet 3   No facility-administered medications prior to visit.    PAST MEDICAL HISTORY: Past Medical History:  Diagnosis Date   ADHD (attention deficit hyperactivity disorder)    no current med.   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   OCD (obsessive compulsive disorder)    Oppositional defiant disorder    PTSD (post-traumatic stress disorder)    Seizures (HCC)    Stuffy nose 07/01/2017    PAST SURGICAL HISTORY: Past Surgical History:  Procedure Laterality Date   GANGLION CYST EXCISION Right 07/03/2017   Procedure: RIGHT HAND NEUROMA REMOVAL;  Surgeon: Jerri Kay HERO, MD;  Location: [facility];  Service: Orthopedics;  Laterality: Right;   neuroma of hand removed     TENDON LENGTHENING Bilateral    as a child   TOOTH EXTRACTION      FAMILY HISTORY: Family History  Problem Relation Age of Onset   Diabetes Mother    Heart attack Father    Cancer Father    Sleep apnea Father    Alcohol abuse Paternal Aunt    Drug abuse Paternal Aunt    Bipolar disorder Cousin     SOCIAL HISTORY: Social History   Socioeconomic History   Marital status: Divorced    Spouse name: Not on file   Number of children: Not on file   Years of education: Not on file   Highest education level: Not on file  Occupational History   Not on file  Tobacco Use   Smoking status: Former    Current packs/day: 0.00    Types: Cigarettes    Quit date: 01/23/2022    Years since quitting: 2.3    Passive exposure: Past   Smokeless tobacco: Former    Types: Chew    Quit date: 02/12/2016  Vaping Use   Vaping status: Never Used  Substance and Sexual Activity   Alcohol use: Yes    Comment: occasional   Drug use: Yes    Types: Marijuana   Sexual activity: Yes    Partners: Female  Other Topics Concern   Not on file  Social History Narrative   Pt lives with family    Pt works     Social Drivers of Corporate investment banker Strain: Not on file  Food Insecurity: Not on file  Transportation Needs: Not on file  Physical Activity: Not on file  Stress: Not on file  Social Connections: Not on file  Intimate Partner Violence: Not on file      PHYSICAL EXAM  Vitals:   05/18/24 1046  BP: 129/81  Pulse: 69  Weight: 206 lb (93.4 kg)  Height: 5' 6 (1.676 m)    Body mass index is 33.25 kg/m.  Generalized: Well developed, in no acute distress  Chest: Lungs clear to auscultation bilaterally  Neurological examination  Mentation: Alert oriented to time, place, history taking. Follows all commands speech and language fluent Cranial nerve II-XII: Facial Symmetry noted Gait and station: Gait is normal.    DIAGNOSTIC DATA (LABS, IMAGING, TESTING) - I reviewed patient records, labs, notes, testing and imaging myself where available.  Lab Results  Component Value Date   WBC 9.3 05/28/2023   HGB 16.3 05/28/2023   HCT 47.9 05/28/2023   MCV 85.4 05/28/2023   PLT 274 05/28/2023      Component Value Date/Time   NA 136 05/28/2023 1120   NA 142 12/27/2021 1406   K 4.0 05/28/2023 1120   CL 105 05/28/2023 1120   CO2 19 (L) 05/28/2023 1120   GLUCOSE 101 (H) 05/28/2023 1120   BUN 14 05/28/2023 1120   BUN 15 12/27/2021 1406   CREATININE 0.84 05/28/2023 1120   CREATININE 0.66 09/22/2013 1050   CALCIUM 9.5 05/28/2023 1120   PROT 7.9 12/27/2021 1406   ALBUMIN 5.0 12/27/2021 1406   AST 22 12/27/2021 1406   ALT 34 12/27/2021 1406   ALKPHOS 67 12/27/2021 1406   BILITOT 0.2 12/27/2021 1406   GFRNONAA >60 05/28/2023 1120   GFRAA 148 01/31/2018 1149   Lab Results  Component Value Date   CHOL 260 (H) 04/01/2023   HDL 42 04/01/2023   LDLCALC 150 (H) 04/01/2023   TRIG 363 (H) 04/01/2023   CHOLHDL 6.2 (H) 04/01/2023   Lab Results  Component Value Date   HGBA1C 5.3 01/31/2018   No results found for: [id number] Lab Results  Component Value Date   TSH  0.774 08/01/2017      ASSESSMENT AND PLAN 33 y.o. year old male  has a past medical history of ADHD (attention deficit hyperactivity disorder), Asthma, Depression, Migraines, Neuroma of hand (06/2017), OCD (obsessive compulsive disorder), Oppositional defiant disorder, PTSD (post-traumatic stress disorder), Seizures (HCC), and Stuffy nose (07/01/2017). here with:  OSA on CPAP  - CPAP compliance excellent - Good treatment of AHI  - Encourage patient to use CPAP nightly and > 4 hours each night - order placed for them to look at the automatic shut off feature - F/U in 1 year or sooner if needed   Duwaine Russell, MSN, NP-C 05/18/2024, 11:01 AM [facility] [address] [place], [place] [address] [phone]

## 2024-05-19 NOTE — Progress Notes (Signed)
 RE: new order on and off feature automatic Received: Today New, Adine Neysa Nena GORMAN, RN; Joylene Carlean Sheree Leveda Jackson Easter Delsa Eleanor; 1 other Received, thank you!     Previous Messages    ----- Message ----- From: Neysa Nena GORMAN, RN Sent: 05/19/2024   9:07 AM EDT To: Adine Joylene; Avelina Jackson; Ephraim Dollar* Subject: new order on and off feature automatic        New order in EPIC for pt.  Wants option to change the automatic on/off feature to on/off as desired  Kaleen CHARM Griffiths Male, 32 y.o., 1991/04/03 MRN: [id number]  Thanks,  Particia

## 2024-05-26 ENCOUNTER — Encounter: Payer: Medicaid Other | Attending: Psychology | Admitting: Psychology

## 2024-05-26 DIAGNOSIS — F39 Unspecified mood [affective] disorder: Secondary | ICD-10-CM | POA: Insufficient documentation

## 2024-05-26 DIAGNOSIS — F422 Mixed obsessional thoughts and acts: Secondary | ICD-10-CM | POA: Insufficient documentation

## 2024-05-26 DIAGNOSIS — G4733 Obstructive sleep apnea (adult) (pediatric): Secondary | ICD-10-CM | POA: Insufficient documentation

## 2024-06-08 ENCOUNTER — Ambulatory Visit: Admitting: Orthopedic Surgery

## 2024-06-08 ENCOUNTER — Encounter: Payer: Self-pay | Admitting: Orthopedic Surgery

## 2024-06-08 DIAGNOSIS — M722 Plantar fascial fibromatosis: Secondary | ICD-10-CM

## 2024-06-08 NOTE — Progress Notes (Signed)
    06/08/2024   Chief Complaint  Patient presents with   Foot Pain    Right     No diagnosis found.  What pharmacy do you use ? [address]_________  DOI/DOS/   Same/ note interested in surgery

## 2024-06-08 NOTE — Progress Notes (Signed)
     06/08/2024   Chief Complaint  Patient presents with   Foot Pain    Right     Encounter Diagnosis  Name Primary?   Plantar fasciitis of right foot Yes    What pharmacy do you use ? __[facility]_________  DOI/DOS/   Same/ not interested in surgery   33 year old male with plantar fasciitis currently undergoing treatment with stretching, ibuprofen , ice, heel pads and intermittent injections.  He has received 3 injections.  Currently the injections seem to be lasting longer in terms of relieving his pain.  We last saw him on September 8 last injection was July 21 he did not have any pain until yesterday after standing for several hours [place] the weekend [place] the fle[place]  Recommend continue ice, therapy with stretching ibuprofen  heel pads and return in 6 weeks

## 2024-07-01 ENCOUNTER — Encounter: Attending: Psychology | Admitting: Psychology

## 2024-07-01 DIAGNOSIS — G4733 Obstructive sleep apnea (adult) (pediatric): Secondary | ICD-10-CM | POA: Insufficient documentation

## 2024-07-01 DIAGNOSIS — F422 Mixed obsessional thoughts and acts: Secondary | ICD-10-CM | POA: Diagnosis not present

## 2024-07-01 DIAGNOSIS — F39 Unspecified mood [affective] disorder: Secondary | ICD-10-CM | POA: Insufficient documentation
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
## 2024-07-12 DIAGNOSIS — G4733 Obstructive sleep apnea (adult) (pediatric): Secondary | ICD-10-CM | POA: Diagnosis not present

## 2024-07-20 ENCOUNTER — Ambulatory Visit: Admitting: Orthopedic Surgery

## 2024-07-31 ENCOUNTER — Encounter: Payer: Self-pay | Admitting: Orthopedic Surgery

## 2024-07-31 ENCOUNTER — Ambulatory Visit: Admitting: Orthopedic Surgery

## 2024-07-31 DIAGNOSIS — M722 Plantar fascial fibromatosis: Secondary | ICD-10-CM

## 2024-07-31 NOTE — Patient Instructions (Signed)
Physical therapy has been ordered for you in [place] . You will need to call and schedule the phone number is [phone]

## 2024-07-31 NOTE — Progress Notes (Signed)
° ° °  07/31/2024   Chief Complaint  Patient presents with   Foot Pain    Right     No diagnosis found.  What pharmacy do you use ? [address]________  DOI/DOS/ Date:    Did you get better, worse or no change (Answer below)    Has changed, heel feels better but now pain is center bottom of foot, once moving is ok but after rest is tight painful

## 2024-07-31 NOTE — Progress Notes (Signed)
° ° °  07/31/2024   Chief Complaint  Patient presents with   Foot Pain    Right     Encounter Diagnosis  Name Primary?   Plantar fasciitis of right foot Yes    What pharmacy do you use ? [address]________  33 year old male plantar fasciitis treated several times with multiple injections ice heel cups stretching at home anti-inflammatories ibuprofen  has gotten better in terms of his insertional pain but now has midfoot pain which is usually really relieved by stretching  Focused exam no tenderness in the actual plantar fascial insertion but tenderness in the medial band of the plantar fascia.  Although he appears to have good excursion of his Achilles tendon at this point the only thing I can recommend is therapy he will go for therapy and come back in 2 months

## 2024-08-11 ENCOUNTER — Ambulatory Visit: Admitting: Physical Therapy

## 2024-08-11 ENCOUNTER — Encounter: Payer: Self-pay | Admitting: Physical Therapy

## 2024-08-11 DIAGNOSIS — M722 Plantar fascial fibromatosis: Secondary | ICD-10-CM | POA: Diagnosis not present

## 2024-08-11 DIAGNOSIS — R2689 Other abnormalities of gait and mobility: Secondary | ICD-10-CM | POA: Insufficient documentation

## 2024-08-11 DIAGNOSIS — M25571 Pain in right ankle and joints of right foot: Secondary | ICD-10-CM | POA: Insufficient documentation

## 2024-08-11 DIAGNOSIS — M25674 Stiffness of right foot, not elsewhere classified: Secondary | ICD-10-CM | POA: Diagnosis present

## 2024-08-11 DIAGNOSIS — M6281 Muscle weakness (generalized): Secondary | ICD-10-CM | POA: Diagnosis present
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
## 2024-08-18 ENCOUNTER — Encounter: Attending: Psychology | Admitting: Psychology

## 2024-08-18 ENCOUNTER — Encounter: Payer: Self-pay | Admitting: Psychology

## 2024-08-18 DIAGNOSIS — F39 Unspecified mood [affective] disorder: Secondary | ICD-10-CM | POA: Diagnosis present

## 2024-08-18 DIAGNOSIS — G4733 Obstructive sleep apnea (adult) (pediatric): Secondary | ICD-10-CM | POA: Insufficient documentation

## 2024-08-18 DIAGNOSIS — F422 Mixed obsessional thoughts and acts: Secondary | ICD-10-CM | POA: Insufficient documentation

## 2024-08-18 NOTE — Progress Notes (Signed)
 Neuropsychology Visit  Patient:  JAECION DEMPSTER   DOB: 02/18/1991  MR Number: [id number]  Location: [facility] [place] [facility] [address] [place], [address] [place] [address] Dept: [phone]  Date of Service: 08/18/2024  Start: 9 AM End: 10 AM  Duration of Service: 1 Hour  Provider/Observer:     Norleen JONELLE Asa PsyD  Chief Complaint:      Chief Complaint  Patient presents with   Anxiety   Depression   ADHD   Stress   Sleeping Problem    Reason For Service:     Gabe Glace is a 33 year old male referred by Glendia Guerin, MD, for therapeutic interventions. He reconnected with his prior psychiatrist, Barnie Gull, MD. The patient has a history of a long-term mood disorder, attentional deficits, and anxiety/OCD symptoms, with a history of episodic difficulties in relationships.   Treatment Interventions:  Today we worked on pharmacologist and strategies to surround mood disturbance and numerous psychosocial stressors.  Participation Level:   Active  Participation Quality:  Appropriate      Behavioral Observation:  Well Groomed, Alert, and Appropriate.   Current Psychosocial Factors: Reports interpersonal conflict with his partner, Alan, over holiday family arrangements. He felt excluded from her family's Thanksgiving and Christmas gatherings, leading to an argument where he expressed his frustration. Subsequently, they have discussed their future, with plans for her to move in by mid-year and intentions to marry this year. They have started using a wedding planning application to manage budgeting. A significant stressor involves the dissolution of a 20-year friendship with his best friend, Beryl, following a conflict over a car. The patient acquired a [address] that previously belonged to [place]. Bobby expressed resentment and jealousy, leading to a heated exchange and the  termination of their friendship. Patient reports that Beryl has also cut ties with other friends and family, suggesting the conflict may not be specific to the patient. Reports ongoing sleep disturbance, characterized by waking every 30 minutes after an initial period of sleep. He is able to return to sleep without significant difficulty. He uses a CPAP machine for sleep and noted a mineral buildup from using tap water in the humidifier, which he plans to address by using distilled water. He also reports financial stress related to utility bills, although the most recent bill was lower. Discussed household maintenance issues, including a leaking sink and a malfunctioning water heater. The hot water supply is diminished, and he suspects a failing heating element. He plans to repair it.  Content of Session:   Reviewed recent psychosocial stressors, including relationship dynamics with his partner and the conflict with his former best friend. Discussed sleep hygiene strategies related to his pattern of frequent awakenings, including not stressing about waking and only getting out of bed to urinate if unable to fall back asleep. Provided psychoeducation on the function of a CPAP humidifier and the rationale for using distilled water to prevent mineral buildup and prolong machine life. Also provided detailed advice on home maintenance, including troubleshooting and repairing the water heater, the benefits of maintaining the current tank system versus a tankless system, and strategies for managing utility costs. Addressed conflict with his son, Lang, regarding shower duration, providing psychoeducation on adequate hygiene timeframes.   Effectiveness of Interventions: Patient is open to discussions about reducing overall stressors and we addressed many specific psychosocial stressors that in the past have led to mood disturbance.  Target  Goals:   Working on cognitive/behavioral therapeutic interventions to manage  life stressors and maintain good coping skills.  Goals Last Reviewed:   08/18/2024  Goals Addressed Today:    Follow-up on interpersonal relationship issues, particularly with his partner Alan, focusing on communication and future planning. Addressed conflict resolution strategies regarding the termination of his long-standing friendship with Bobby. Discussed sleep hygiene in the context of recent sleep disturbances. Reviewed practical problem-solving for household and financial stressors. Addressed issues related to the ongoing maintenance of a [address], which is a source of both satisfaction and stress.  Impression/Diagnosis:   Marguerite Jarboe is a 33 year old male who was referred by Glendia Guerin, MD for therapeutic interventions.  The patient is also recently reconnected with his prior psychiatrist Barnie Gull, MD with behavioral health in [place].  Both Dr. Gull and myself had seen [name] for some time with me last seen him in 2018.  The patient has been dealing with a long-term mood disorder, attentional deficits and anxiety/OCD symptoms.  He has had times of stable functioning in the past but also episodic difficulties in multiple challenging relationship issues.   The patient has had considerable stress recently.  His mother contracted COVID-19 and was hospitalized and passed away from COVID-19/Covid pneumonia.  The patient was very close to his mother and they live together and she was a very important person in his life including helping with taking care of his children who the patient has full custody of.  Diagnosis:   Unspecified mood (affective) disorder  Mixed obsessional thoughts and acts  OSA on CPAP    Norleen Asa, Psy.D. Clinical Psychologist Neuropsychologist

## 2024-08-21 ENCOUNTER — Ambulatory Visit: Attending: Orthopedic Surgery

## 2024-08-21 DIAGNOSIS — M6281 Muscle weakness (generalized): Secondary | ICD-10-CM | POA: Insufficient documentation

## 2024-08-21 DIAGNOSIS — M25674 Stiffness of right foot, not elsewhere classified: Secondary | ICD-10-CM | POA: Insufficient documentation

## 2024-08-21 DIAGNOSIS — M25571 Pain in right ankle and joints of right foot: Secondary | ICD-10-CM | POA: Insufficient documentation

## 2024-08-21 DIAGNOSIS — R2689 Other abnormalities of gait and mobility: Secondary | ICD-10-CM | POA: Insufficient documentation

## 2024-08-24 ENCOUNTER — Ambulatory Visit

## 2024-08-24 ENCOUNTER — Encounter: Payer: Self-pay | Admitting: Physical Therapy

## 2024-08-24 DIAGNOSIS — M25674 Stiffness of right foot, not elsewhere classified: Secondary | ICD-10-CM

## 2024-08-24 DIAGNOSIS — M6281 Muscle weakness (generalized): Secondary | ICD-10-CM

## 2024-08-24 DIAGNOSIS — M25571 Pain in right ankle and joints of right foot: Secondary | ICD-10-CM | POA: Diagnosis present

## 2024-08-24 DIAGNOSIS — R2689 Other abnormalities of gait and mobility: Secondary | ICD-10-CM

## 2024-08-24 NOTE — Therapy (Signed)
 " OUTPATIENT PHYSICAL THERAPY TREATMENT   Patient Name: [name] MRN: [id number] DOB:March 25, 1991, 34 y.o., male Today's Date: 08/24/2024  END OF SESSION:  PT End of Session - 08/24/24 1604     Visit Number 2    Number of Visits 8    Date for Recertification  10/06/24    Authorization Type [employer]    PT Start Time 1600    PT Stop Time 1640    PT Time Calculation (min) 40 min    Activity Tolerance Patient tolerated treatment well           Past Medical History:  Diagnosis Date   ADHD (attention deficit hyperactivity disorder)    no current med.   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   OCD (obsessive compulsive disorder)    Oppositional defiant disorder    PTSD (post-traumatic stress disorder)    Seizures (HCC)    Stuffy nose 07/01/2017   Past Surgical History:  Procedure Laterality Date   GANGLION CYST EXCISION Right 07/03/2017   Procedure: RIGHT HAND NEUROMA REMOVAL;  Surgeon: Jerri Kay HERO, MD;  Location: [facility];  Service: Orthopedics;  Laterality: Right;   neuroma of hand removed     TENDON LENGTHENING Bilateral    as a child   TOOTH EXTRACTION     Patient Active Problem List   Diagnosis Date Noted   ERRONEOUS ENCOUNTER--DISREGARD 04/23/2023   Chest pain of uncertain etiology 10/16/2022   Palpitations 10/16/2022   Transient vision disturbance of both eyes 10/16/2022   Syncope 09/27/2022   Tachycardia, paroxysmal (HCC) 09/27/2022   Hypoglycemia 09/27/2022   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   Diaphoresis 09/27/2022   Periumbilical abdominal pain 12/27/2021   Epididymitis, left 11/17/2021   OSA on CPAP 10/15/2018   Hyperlipidemia 01/31/2018   Sleep disorder, circadian, delayed sleep phase type 11/11/2017   Mild intermittent asthma 11/11/2017   Neuroma of hand 06/24/2017   Snoring 12/08/2015   OCD (obsessive compulsive disorder) 08/06/2012   Episodic mood  disorder 07/18/2011   ADHD (attention deficit hyperactivity disorder), combined type 07/18/2011    PCP: Alphonsa Glendia LABOR, MD  REFERRING PROVIDER: Margrette Taft BRAVO, MD  REFERRING DIAG: M72.2 (ICD-10-CM) - Plantar fasciitis of right foot  THERAPY DIAG:  Pain in right ankle and joints of right foot  Stiffness of right foot, not elsewhere classified  Muscle weakness (generalized)  Other abnormalities of gait and mobility  Rationale for Evaluation and Treatment: Rehabilitation  ONSET DATE: Nov 24  SUBJECTIVE:   SUBJECTIVE STATEMENT: Pt states he is sore from helping clearing out a house. Whole body is sore.   PERTINENT HISTORY: COPD/asthma  PAIN:  Are you having pain? Yes: NPRS scale: mild/moderate Pain location: bottom of foot Pain description: hard to get moving; sore and sharp Aggravating factors: Sitting after being on it Relieving factors: stretching, walking  PRECAUTIONS: None  RED FLAGS: None   WEIGHT BEARING RESTRICTIONS: No  FALLS:  Has patient fallen in last 6 months? No  LIVING ENVIRONMENT: Lives with: lives with their family (2 kids) Lives in: House/apartment Stairs: 2 step ups Has following equipment at home: None  OCCUPATION: [employer] driver  PLOF: Independent  PATIENT GOALS: Improve strength and mobility  NEXT MD VISIT: PRN  OBJECTIVE:  Note: Objective measures were completed at Evaluation unless otherwise noted.  DIAGNOSTIC FINDINGS: R foot x-ray 09/02/23 IMPRESSION: No acute  osseous abnormality.  Calcaneal spur  PATIENT SURVEYS:  LEFS  Extreme difficulty/unable (0), Quite a bit of difficulty (1), Moderate difficulty (2), Little difficulty (3), No difficulty (4) Survey date:  08/11/24  Any of your usual work, housework or school activities 2  2. Usual hobbies, recreational or sporting activities 2  3. Getting into/out of the bath 2  4. Walking between rooms 2  5. Putting on socks/shoes 3  6. Squatting  4  7. Lifting an  object, like a bag of groceries from the floor 3  8. Performing light activities around your home 3  9. Performing heavy activities around your home 2  10. Getting into/out of a car 3  11. Walking 2 blocks 2  12. Walking 1 mile 2  13. Going up/down 10 stairs (1 flight) 2  14. Standing for 1 hour 2  15.  sitting for 1 hour 4  16. Running on even ground 2  17. Running on uneven ground 2  18. Making sharp turns while running fast 2  19. Hopping  3  20. Rolling over in bed 3  Score total:  49     COGNITION: Overall cognitive status: Within functional limits for tasks assessed     SENSATION: WFL  EDEMA:  None  MUSCLE LENGTH: Did not assess  POSTURE: No Significant postural limitations  PALPATION: TTP distal plantar fascia  LOWER EXTREMITY ROM:  Active ROM Right eval Left eval  Hip flexion    Hip extension    Hip abduction    Hip adduction    Hip internal rotation    Hip external rotation    Knee flexion    Knee extension    Ankle dorsiflexion 10 12  Ankle plantarflexion 50 60  Ankle inversion 25 35  Ankle eversion 25 p! 30   (Blank rows = not tested)  LOWER EXTREMITY MMT:  MMT Right eval Left eval  Hip flexion 5 5  Hip extension    Hip abduction sitting 5 5  Hip adduction    Hip internal rotation    Hip external rotation    Knee flexion 4- 4-  Knee extension 5 5  Ankle dorsiflexion 5 5  Ankle plantarflexion 4 5  Ankle inversion 4- 5  Ankle eversion 5 5   (Blank rows = not tested)  LOWER EXTREMITY SPECIAL TESTS:  Did not assess  FUNCTIONAL TESTS:  SLS: Right 5.37 sec (increases pain), Left 8.94 sec Single leg heel raise on L: 7 times, R: at least 10  GAIT: Distance walked: Into clinic Assistive device utilized: None Level of assistance: Complete Independence Comments: Normal reciprocal pattern                                                                                                                                TREATMENT DATE:   08/24/24 Nustep L1 x 10 min UEs/LEs Standing gastroc stretch 2x 30 Standing soleus stretch 2x 30 Sitting  figure 4 stretch with ankle inversion stretch x30 Sitting figure 4 stretch with ankle eversion stretch x 30 Manual therapy: STM & TPR posterior tibialis Self care: self stretch and self massage Sitting ankle eversion red TB 2x10 Big toe abd 2x10 Sitting heel raise with ball squeeze 2x10 Sitting ankle inv isometric ball squeeze 2x10   08/11/24 Eval only per Medicaid    PATIENT EDUCATION:  Education details: Exam findings, POC Person educated: Patient Education method: Medical Illustrator Education comprehension: verbalized understanding, returned demonstration, and needs further education  HOME EXERCISE PROGRAM: Access Code: [email or web address] URL: [email or web address] Date: 08/24/2024 Prepared by: Garvey Westcott April Earnie Starring  Exercises - Tibialis Posterior Stretch at Wall  - 1 x daily - 7 x weekly - 2 sets - 30 sec hold - Seated Ankle Inversion Eversion PROM  - 1 x daily - 7 x weekly - 2 sets - 30 sec hold - Seated Ankle Eversion with Resistance (Mirrored)  - 1 x daily - 7 x weekly - 2 sets - 10 reps - Seated Calf Raise With Small Ball at Heels  - 1 x daily - 7 x weekly - 2 sets - 10 reps - Isometric Ankle Inversion  - 1 x daily - 7 x weekly - 2 sets - 10 reps  ASSESSMENT:  CLINICAL IMPRESSION: Treatment focused on calf and post tib stretching. Most of pain today is in pt's arch along post tib tendon insertion. Provided manual therapy and discussed with pt how to improve post tib stiffness/pain at home. Initiated ankle and post tib strengthening this session. Most of his pain continues to be when he rocks from his heel to toe off when his arch flattens during gait. May be good to try taping next session to provide more arch support. Discussed better orthotics to support his arch.   OBJECTIVE IMPAIRMENTS: Abnormal gait, decreased activity tolerance,  decreased balance, decreased coordination, decreased endurance, decreased mobility, difficulty walking, decreased ROM, decreased strength, increased fascial restrictions, increased muscle spasms, improper body mechanics, postural dysfunction, and pain.   ACTIVITY LIMITATIONS: standing, transfers, locomotion level, and caring for others  PARTICIPATION LIMITATIONS: meal prep, cleaning, laundry, shopping, community activity, occupation, and yard work  PERSONAL FACTORS: Age, Fitness, Past/current experiences, and Time since onset of injury/illness/exacerbation are also affecting patient's functional outcome.   REHAB POTENTIAL: Good  CLINICAL DECISION MAKING: Evolving/moderate complexity  EVALUATION COMPLEXITY: Moderate   GOALS: Goals reviewed with patient? Yes  SHORT TERM GOALS: Target date: 09/08/2024  Pt will be ind with initial HEP Baseline: Goal status: INITIAL  2.  Pt will demo L = R ankle ROM without pain Baseline:  Goal status: INITIAL    LONG TERM GOALS: Target date: 10/06/2024   Pt will be ind with management and progression of HEP Baseline:  Goal status: INITIAL  2.  Pt will be able to perform single leg heel raise x20 to demo increased strength and endurance Baseline:  Goal status: INITIAL  3.  Pt will be able to perform SLS x 10 sec bilat to demo increased standing stability without pain Baseline:  Goal status: INITIAL  4.  Pt will report reduction in pain by >/=75% Baseline:  Goal status: INITIAL  5.  Pt will have improved LEFS to >/=59 to demo MCID Baseline:  Goal status: INITIAL    PLAN:  PT FREQUENCY: 1x/week  PT DURATION: 8 weeks  PLANNED INTERVENTIONS: 97164- PT Re-evaluation, 97750- Physical Performance Testing, 97110-Therapeutic exercises, 97530- Therapeutic activity, [id number]- Neuromuscular re-education, 97535- Self Care, 02859- Manual  therapy, [id number]- Gait training, 02239- Orthotic Initial, [id number]- Vasopneumatic device, [id number]- Ultrasound, [id number]-  Ionotophoresis 4mg /ml Dexamethasone , 79439 (1-2 muscles), 20561 (3+ muscles)- Dry Needling, Patient/Family education, Balance training, Taping, Joint mobilization, Cryotherapy, and Moist heat  PLAN FOR NEXT SESSION: Initiate gastroc/soleus strengthening, posterior tib strengthening, arch stabilization exercises.    Proctor Carriker April Ma L Whitnie Deleon, PT, DPT 08/24/2024, 4:04 PM  "

## 2024-08-28 ENCOUNTER — Encounter: Payer: Self-pay | Admitting: *Deleted

## 2024-08-28 ENCOUNTER — Ambulatory Visit: Admitting: *Deleted

## 2024-08-28 DIAGNOSIS — M6281 Muscle weakness (generalized): Secondary | ICD-10-CM

## 2024-08-28 DIAGNOSIS — M25571 Pain in right ankle and joints of right foot: Secondary | ICD-10-CM

## 2024-08-28 DIAGNOSIS — M25674 Stiffness of right foot, not elsewhere classified: Secondary | ICD-10-CM

## 2024-08-28 DIAGNOSIS — R2689 Other abnormalities of gait and mobility: Secondary | ICD-10-CM

## 2024-08-28 NOTE — Therapy (Signed)
 " OUTPATIENT PHYSICAL THERAPY TREATMENT   Patient Name: [name] MRN: [id number] DOB:02/03/1991, 34 y.o., male Today's Date: 08/28/2024  END OF SESSION:  PT End of Session - 08/28/24 0805     Visit Number 3    Number of Visits 8    Date for Recertification  10/06/24    Authorization Type [employer]    PT Start Time 0800    PT Stop Time 0846    PT Time Calculation (min) 46 min           Past Medical History:  Diagnosis Date   ADHD (attention deficit hyperactivity disorder)    no current med.   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   OCD (obsessive compulsive disorder)    Oppositional defiant disorder    PTSD (post-traumatic stress disorder)    Seizures (HCC)    Stuffy nose 07/01/2017   Past Surgical History:  Procedure Laterality Date   GANGLION CYST EXCISION Right 07/03/2017   Procedure: RIGHT HAND NEUROMA REMOVAL;  Surgeon: Jerri Kay HERO, MD;  Location: [facility];  Service: Orthopedics;  Laterality: Right;   neuroma of hand removed     TENDON LENGTHENING Bilateral    as a child   TOOTH EXTRACTION     Patient Active Problem List   Diagnosis Date Noted   ERRONEOUS ENCOUNTER--DISREGARD 04/23/2023   Chest pain of uncertain etiology 10/16/2022   Palpitations 10/16/2022   Transient vision disturbance of both eyes 10/16/2022   Syncope 09/27/2022   Tachycardia, paroxysmal (HCC) 09/27/2022   Hypoglycemia 09/27/2022   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   Diaphoresis 09/27/2022   Periumbilical abdominal pain 12/27/2021   Epididymitis, left 11/17/2021   OSA on CPAP 10/15/2018   Hyperlipidemia 01/31/2018   Sleep disorder, circadian, delayed sleep phase type 11/11/2017   Mild intermittent asthma 11/11/2017   Neuroma of hand 06/24/2017   Snoring 12/08/2015   OCD (obsessive compulsive disorder) 08/06/2012   Episodic mood disorder 07/18/2011   ADHD (attention deficit hyperactivity  disorder), combined type 07/18/2011    PCP: Alphonsa Glendia LABOR, MD  REFERRING PROVIDER: Margrette Taft BRAVO, MD  REFERRING DIAG: M72.2 (ICD-10-CM) - Plantar fasciitis of right foot  THERAPY DIAG:  Pain in right ankle and joints of right foot  Stiffness of right foot, not elsewhere classified  Muscle weakness (generalized)  Other abnormalities of gait and mobility  Rationale for Evaluation and Treatment: Rehabilitation  ONSET DATE: Nov 24  SUBJECTIVE:   SUBJECTIVE STATEMENT: Pt states RT foot is doing better. Both knees hurt  PERTINENT HISTORY: COPD/asthma  PAIN:  Are you having pain? Yes: NPRS scale: mild/moderate Pain location: bottom of foot Pain description: hard to get moving; sore and sharp Aggravating factors: Sitting after being on it Relieving factors: stretching, walking  PRECAUTIONS: None  RED FLAGS: None   WEIGHT BEARING RESTRICTIONS: No  FALLS:  Has patient fallen in last 6 months? No  LIVING ENVIRONMENT: Lives with: lives with their family (2 kids) Lives in: House/apartment Stairs: 2 step ups Has following equipment at home: None  OCCUPATION: [employer] driver  PLOF: Independent  PATIENT GOALS: Improve strength and mobility  NEXT MD VISIT: PRN  OBJECTIVE:  Note: Objective measures were completed at Evaluation unless otherwise noted.  DIAGNOSTIC FINDINGS: R foot x-ray 09/02/23 IMPRESSION: No acute osseous abnormality.  Calcaneal spur  PATIENT SURVEYS:  LEFS  Extreme difficulty/unable (0), Quite  a bit of difficulty (1), Moderate difficulty (2), Little difficulty (3), No difficulty (4) Survey date:  08/11/24  Any of your usual work, housework or school activities 2  2. Usual hobbies, recreational or sporting activities 2  3. Getting into/out of the bath 2  4. Walking between rooms 2  5. Putting on socks/shoes 3  6. Squatting  4  7. Lifting an object, like a bag of groceries from the floor 3  8. Performing light activities around your  home 3  9. Performing heavy activities around your home 2  10. Getting into/out of a car 3  11. Walking 2 blocks 2  12. Walking 1 mile 2  13. Going up/down 10 stairs (1 flight) 2  14. Standing for 1 hour 2  15.  sitting for 1 hour 4  16. Running on even ground 2  17. Running on uneven ground 2  18. Making sharp turns while running fast 2  19. Hopping  3  20. Rolling over in bed 3  Score total:  49     COGNITION: Overall cognitive status: Within functional limits for tasks assessed     SENSATION: WFL  EDEMA:  None  MUSCLE LENGTH: Did not assess  POSTURE: No Significant postural limitations  PALPATION: TTP distal plantar fascia  LOWER EXTREMITY ROM:  Active ROM Right eval Left eval  Hip flexion    Hip extension    Hip abduction    Hip adduction    Hip internal rotation    Hip external rotation    Knee flexion    Knee extension    Ankle dorsiflexion 10 12  Ankle plantarflexion 50 60  Ankle inversion 25 35  Ankle eversion 25 p! 30   (Blank rows = not tested)  LOWER EXTREMITY MMT:  MMT Right eval Left eval  Hip flexion 5 5  Hip extension    Hip abduction sitting 5 5  Hip adduction    Hip internal rotation    Hip external rotation    Knee flexion 4- 4-  Knee extension 5 5  Ankle dorsiflexion 5 5  Ankle plantarflexion 4 5  Ankle inversion 4- 5  Ankle eversion 5 5   (Blank rows = not tested)  LOWER EXTREMITY SPECIAL TESTS:  Did not assess  FUNCTIONAL TESTS:  SLS: Right 5.37 sec (increases pain), Left 8.94 sec Single leg heel raise on L: 7 times, R: at least 10  GAIT: Distance walked: Into clinic Assistive device utilized: None Level of assistance: Complete Independence Comments: Normal reciprocal pattern                                                                                                                                TREATMENT DATE:  08/28/24 Nustep L3 x 1 min UEs/LEs Standing gastroc stretch 2x 30 Standing soleus stretch 2x  30 Sitting figure 4 stretch with ankle inversion stretch x30 Sitting figure 4 stretch with ankle eversion  stretch x 30 Manual therapy: STM & TPR posterior tibialis Self care: self stretch and self massage reviewed Sitting ankle eversion and inversion yellow TB 3 x 10 Big toe abd 2x10 Sitting heel raise with ball squeeze 2x10 Rocker board   x 3 mins balance Seated   short foot  x 6 hold 10 secs  08/11/24 Eval only per Medicaid    PATIENT EDUCATION:  Education details: Exam findings, POC Person educated: Patient Education method: Medical Illustrator Education comprehension: verbalized understanding, returned demonstration, and needs further education  HOME EXERCISE PROGRAM: Access Code: [email or web address] URL: [email or web address] Date: 08/24/2024 Prepared by: Gellen April Earnie Starring  Exercises - Tibialis Posterior Stretch at [employer]  - 1 x daily - 7 x weekly - 2 sets - 30 sec hold - Seated Ankle Inversion Eversion PROM  - 1 x daily - 7 x weekly - 2 sets - 30 sec hold - Seated Ankle Eversion with Resistance (Mirrored)  - 1 x daily - 7 x weekly - 2 sets - 10 reps - Seated Calf Raise With Small Ball at Heels  - 1 x daily - 7 x weekly - 2 sets - 10 reps - Isometric Ankle Inversion  - 1 x daily - 7 x weekly - 2 sets - 10 reps  ASSESSMENT:  CLINICAL IMPRESSION: Treatment focused on calf and post tib stretching as well as mm activation and balance. Pt wearing inserts now and feels that they are helping.     OBJECTIVE IMPAIRMENTS: Abnormal gait, decreased activity tolerance, decreased balance, decreased coordination, decreased endurance, decreased mobility, difficulty walking, decreased ROM, decreased strength, increased fascial restrictions, increased muscle spasms, improper body mechanics, postural dysfunction, and pain.   ACTIVITY LIMITATIONS: standing, transfers, locomotion level, and caring for others  PARTICIPATION LIMITATIONS: meal prep, cleaning,  laundry, shopping, community activity, occupation, and yard work  PERSONAL FACTORS: Age, Fitness, Past/current experiences, and Time since onset of injury/illness/exacerbation are also affecting patient's functional outcome.   REHAB POTENTIAL: Good  CLINICAL DECISION MAKING: Evolving/moderate complexity  EVALUATION COMPLEXITY: Moderate   GOALS: Goals reviewed with patient? Yes  SHORT TERM GOALS: Target date: 09/08/2024  Pt will be ind with initial HEP Baseline: Goal status: INITIAL  2.  Pt will demo L = R ankle ROM without pain Baseline:  Goal status: INITIAL    LONG TERM GOALS: Target date: 10/06/2024   Pt will be ind with management and progression of HEP Baseline:  Goal status: INITIAL  2.  Pt will be able to perform single leg heel raise x20 to demo increased strength and endurance Baseline:  Goal status: INITIAL  3.  Pt will be able to perform SLS x 10 sec bilat to demo increased standing stability without pain Baseline:  Goal status: INITIAL  4.  Pt will report reduction in pain by >/=75% Baseline:  Goal status: INITIAL  5.  Pt will have improved LEFS to >/=59 to demo MCID Baseline:  Goal status: INITIAL    PLAN:  PT FREQUENCY: 1x/week  PT DURATION: 8 weeks  PLANNED INTERVENTIONS: 97164- PT Re-evaluation, 97750- Physical Performance Testing, 97110-Therapeutic exercises, 97530- Therapeutic activity, [id number]- Neuromuscular re-education, 97535- Self Care, 02859- Manual therapy, [id number]- Gait training, [phone]- Orthotic Initial, [phone]- Vasopneumatic device, [id number]- Ultrasound, [id number]- Ionotophoresis 4mg /ml Dexamethasone , 79439 (1-2 muscles), 20561 (3+ muscles)- Dry Needling, Patient/Family education, Balance training, Taping, Joint mobilization, Cryotherapy, and Moist heat  PLAN FOR NEXT SESSION: Initiate gastroc/soleus strengthening, posterior tib strengthening, arch stabilization exercises.    Dakiyah Heinke,CHRIS, PTA, 08/28/2024, 8:49 AM  "

## 2024-09-04 ENCOUNTER — Ambulatory Visit: Admitting: *Deleted

## 2024-09-04 ENCOUNTER — Encounter: Payer: Self-pay | Admitting: *Deleted

## 2024-09-04 DIAGNOSIS — M6281 Muscle weakness (generalized): Secondary | ICD-10-CM

## 2024-09-04 DIAGNOSIS — M25571 Pain in right ankle and joints of right foot: Secondary | ICD-10-CM

## 2024-09-04 DIAGNOSIS — M25674 Stiffness of right foot, not elsewhere classified: Secondary | ICD-10-CM

## 2024-09-04 DIAGNOSIS — R2689 Other abnormalities of gait and mobility: Secondary | ICD-10-CM

## 2024-09-04 NOTE — Therapy (Signed)
 " OUTPATIENT PHYSICAL THERAPY TREATMENT   Patient Name: [name] MRN: [id number] DOB:[birth year], 34 y.o., male Today's Date: 09/04/2024  END OF SESSION:  PT End of Session - 09/04/24 0759     Visit Number 4    Number of Visits 8    Date for Recertification  10/06/24    Authorization Type [employer]    Authorization - Number of Visits 6     PT Start Time [phone]    PT Stop Time 0848    PT Time Calculation (min) 49 min           Past Medical History:  Diagnosis Date   ADHD (attention deficit hyperactivity disorder)    no current med.   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   OCD (obsessive compulsive disorder)    Oppositional defiant disorder    PTSD (post-traumatic stress disorder)    Seizures (HCC)    Stuffy nose 07/01/2017   Past Surgical History:  Procedure Laterality Date   GANGLION CYST EXCISION Right 07/03/2017   Procedure: RIGHT HAND NEUROMA REMOVAL;  Surgeon: Jerri Kay HERO, MD;  Location: [facility];  Service: Orthopedics;  Laterality: Right;   neuroma of hand removed     TENDON LENGTHENING Bilateral    as a child   TOOTH EXTRACTION     Patient Active Problem List   Diagnosis Date Noted   ERRONEOUS ENCOUNTER--DISREGARD 04/23/2023   Chest pain of uncertain etiology 10/16/2022   Palpitations 10/16/2022   Transient vision disturbance of both eyes 10/16/2022   Syncope 09/27/2022   Tachycardia, paroxysmal (HCC) 09/27/2022   Hypoglycemia 09/27/2022   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   Diaphoresis 09/27/2022   Periumbilical abdominal pain 12/27/2021   Epididymitis, left 11/17/2021   OSA on CPAP 10/15/2018   Hyperlipidemia 01/31/2018   Sleep disorder, circadian, delayed sleep phase type 11/11/2017   Mild intermittent asthma 11/11/2017   Neuroma of hand 06/24/2017   Snoring 12/08/2015   OCD (obsessive compulsive disorder) 08/06/2012   Episodic mood disorder 07/18/2011    ADHD (attention deficit hyperactivity disorder), combined type 07/18/2011    PCP: Alphonsa Glendia LABOR, MD  REFERRING PROVIDER: Margrette Taft BRAVO, MD  REFERRING DIAG: M72.2 (ICD-10-CM) - Plantar fasciitis of right foot  THERAPY DIAG:  Pain in right ankle and joints of right foot  Stiffness of right foot, not elsewhere classified  Muscle weakness (generalized)  Other abnormalities of gait and mobility  Rationale for Evaluation and Treatment: Rehabilitation  ONSET DATE: Nov 24  SUBJECTIVE:   SUBJECTIVE STATEMENT: Pt states RT foot is doing better. 3/10   PERTINENT HISTORY: COPD/asthma  PAIN:  Are you having pain? Yes: NPRS scale: mild/moderate Pain location: bottom of foot Pain description: hard to get moving; sore and sharp Aggravating factors: Sitting after being on it Relieving factors: stretching, walking  PRECAUTIONS: None  RED FLAGS: None   WEIGHT BEARING RESTRICTIONS: No  FALLS:  Has patient fallen in last 6 months? No  LIVING ENVIRONMENT: Lives with: lives with their family (2 kids) Lives in: House/apartment Stairs: 2 step ups Has following equipment at home: None  OCCUPATION: [employer] driver  PLOF: Independent  PATIENT GOALS: Improve strength and mobility  NEXT MD VISIT: PRN  OBJECTIVE:  Note: Objective measures were completed at Evaluation unless otherwise noted.  DIAGNOSTIC FINDINGS: R foot x-ray 09/02/23 IMPRESSION: No acute osseous abnormality.  Calcaneal spur  PATIENT SURVEYS:  LEFS  Extreme difficulty/unable (0), Quite a bit of difficulty (1), Moderate difficulty (2), Little difficulty (3), No difficulty (4) Survey date:  08/11/24  Any of your usual work, housework or school activities 2  2. Usual hobbies, recreational or sporting activities 2  3. Getting into/out of the bath 2  4. Walking between rooms 2  5. Putting on socks/shoes 3  6. Squatting  4  7. Lifting an object, like a bag of groceries from the floor 3  8.  Performing light activities around your home 3  9. Performing heavy activities around your home 2  10. Getting into/out of a car 3  11. Walking 2 blocks 2  12. Walking 1 mile 2  13. Going up/down 10 stairs (1 flight) 2  14. Standing for 1 hour 2  15.  sitting for 1 hour 4  16. Running on even ground 2  17. Running on uneven ground 2  18. Making sharp turns while running fast 2  19. Hopping  3  20. Rolling over in bed 3  Score total:  49     COGNITION: Overall cognitive status: Within functional limits for tasks assessed     SENSATION: WFL  EDEMA:  None  MUSCLE LENGTH: Did not assess  POSTURE: No Significant postural limitations  PALPATION: TTP distal plantar fascia  LOWER EXTREMITY ROM:  Active ROM Right eval Left eval  Hip flexion    Hip extension    Hip abduction    Hip adduction    Hip internal rotation    Hip external rotation    Knee flexion    Knee extension    Ankle dorsiflexion 10 12  Ankle plantarflexion 50 60  Ankle inversion 25 35  Ankle eversion 25 p! 30   (Blank rows = not tested)  LOWER EXTREMITY MMT:  MMT Right eval Left eval  Hip flexion 5 5  Hip extension    Hip abduction sitting 5 5  Hip adduction    Hip internal rotation    Hip external rotation    Knee flexion 4- 4-  Knee extension 5 5  Ankle dorsiflexion 5 5  Ankle plantarflexion 4 5  Ankle inversion 4- 5  Ankle eversion 5 5   (Blank rows = not tested)  LOWER EXTREMITY SPECIAL TESTS:  Did not assess  FUNCTIONAL TESTS:  SLS: Right 5.37 sec (increases pain), Left 8.94 sec Single leg heel raise on L: 7 times, R: at least 10  GAIT: Distance walked: Into clinic Assistive device utilized: None Level of assistance: Complete Independence Comments: Normal reciprocal pattern                                                                                                                                TREATMENT DATE:  09/04/24 Nustep L3 x 12 min UEs/LEs Standing calf  raises 2x 15 at door Standing  toe raises   2x 15  at  door Sitting figure 4 stretch with ankle inversion stretch x30 Sitting figure 4 stretch with ankle eversion stretch x 30 Manual therapy: STM & TPR posterior tibialis  Sitting ankle eversion and inversion yellow TB 3 x 10 Big toe abd 2x10 Sitting heel raise with ball squeeze 2x10 Rocker board   x 3 mins balance Seated   short foot  x 6 hold 10 secs  08/11/24 Eval only per Medicaid    PATIENT EDUCATION:  Education details: Exam findings, POC Person educated: Patient Education method: Medical Illustrator Education comprehension: verbalized understanding, returned demonstration, and needs further education  HOME EXERCISE PROGRAM: Access Code: [email or web address] URL: [email or web address] Date: 08/24/2024 Prepared by: Gellen April Earnie Starring  Exercises - Tibialis Posterior Stretch at Wall  - 1 x daily - 7 x weekly - 2 sets - 30 sec hold - Seated Ankle Inversion Eversion PROM  - 1 x daily - 7 x weekly - 2 sets - 30 sec hold - Seated Ankle Eversion with Resistance (Mirrored)  - 1 x daily - 7 x weekly - 2 sets - 10 reps - Seated Calf Raise With Small Ball at Heels  - 1 x daily - 7 x weekly - 2 sets - 10 reps - Isometric Ankle Inversion  - 1 x daily - 7 x weekly - 2 sets - 10 reps  ASSESSMENT:  CLINICAL IMPRESSION: Pt arrived today doing fairly well with RT  foot . Rx  focused again on calf, foot , and post tib Strengthening/stretching as well as mm activation and balance. Mainly fatigue end of session   OBJECTIVE IMPAIRMENTS: Abnormal gait, decreased activity tolerance, decreased balance, decreased coordination, decreased endurance, decreased mobility, difficulty walking, decreased ROM, decreased strength, increased fascial restrictions, increased muscle spasms, improper body mechanics, postural dysfunction, and pain.   ACTIVITY LIMITATIONS: standing, transfers, locomotion level, and caring for  others  PARTICIPATION LIMITATIONS: meal prep, cleaning, laundry, shopping, community activity, occupation, and yard work  PERSONAL FACTORS: Age, Fitness, Past/current experiences, and Time since onset of injury/illness/exacerbation are also affecting patient's functional outcome.   REHAB POTENTIAL: Good  CLINICAL DECISION MAKING: Evolving/moderate complexity  EVALUATION COMPLEXITY: Moderate   GOALS: Goals reviewed with patient? Yes  SHORT TERM GOALS: Target date: 09/08/2024  Pt will be ind with initial HEP Baseline: Goal status: INITIAL  2.  Pt will demo L = R ankle ROM without pain Baseline:  Goal status: INITIAL    LONG TERM GOALS: Target date: 10/06/2024   Pt will be ind with management and progression of HEP Baseline:  Goal status: INITIAL  2.  Pt will be able to perform single leg heel raise x20 to demo increased strength and endurance Baseline:  Goal status: INITIAL  3.  Pt will be able to perform SLS x 10 sec bilat to demo increased standing stability without pain Baseline:  Goal status: INITIAL  4.  Pt will report reduction in pain by >/=75% Baseline:  Goal status: INITIAL  5.  Pt will have improved LEFS to >/=59 to demo MCID Baseline:  Goal status: INITIAL    PLAN:  PT FREQUENCY: 1x/week  PT DURATION: 8 weeks  PLANNED INTERVENTIONS: 97164- PT Re-evaluation, 97750- Physical Performance Testing, 97110-Therapeutic exercises, 97530- Therapeutic activity, [id number]- Neuromuscular re-education, 97535- Self Care, 02859- Manual therapy, [id number]- Gait training, [phone]- Orthotic Initial, [phone]- Vasopneumatic device, [id number]- Ultrasound, [id number]- Ionotophoresis 4mg /ml Dexamethasone , 79439 (1-2 muscles), [id number] (3+ muscles)- Dry Needling, Patient/Family education, Balance training, Taping, Joint mobilization, Cryotherapy, and Moist heat  PLAN FOR NEXT SESSION:  Initiate gastroc/soleus strengthening, posterior tib strengthening, arch stabilization exercises.     Maloree Uplinger,CHRIS, PTA, 09/04/2024, 9:42 AM  "

## 2024-09-08 ENCOUNTER — Encounter: Attending: Psychology | Admitting: Psychology

## 2024-09-08 DIAGNOSIS — F39 Unspecified mood [affective] disorder: Secondary | ICD-10-CM | POA: Diagnosis present

## 2024-09-08 DIAGNOSIS — G4733 Obstructive sleep apnea (adult) (pediatric): Secondary | ICD-10-CM | POA: Insufficient documentation

## 2024-09-08 DIAGNOSIS — F422 Mixed obsessional thoughts and acts: Secondary | ICD-10-CM | POA: Diagnosis present

## 2024-09-11 ENCOUNTER — Encounter: Payer: Self-pay | Admitting: *Deleted

## 2024-09-11 ENCOUNTER — Ambulatory Visit: Admitting: *Deleted

## 2024-09-11 DIAGNOSIS — M25571 Pain in right ankle and joints of right foot: Secondary | ICD-10-CM

## 2024-09-11 DIAGNOSIS — M6281 Muscle weakness (generalized): Secondary | ICD-10-CM

## 2024-09-11 DIAGNOSIS — M25674 Stiffness of right foot, not elsewhere classified: Secondary | ICD-10-CM

## 2024-09-11 NOTE — Therapy (Signed)
 " OUTPATIENT PHYSICAL THERAPY TREATMENT   Patient Name: PHILANDER AKE MRN: [id number] DOB:03-Apr-1991, 34 y.o., male Today's Date: 09/11/2024  END OF SESSION:  PT End of Session - 09/11/24 0805     Visit Number 5    Number of Visits 8    Date for Recertification  10/06/24    Authorization - Number of Visits 6    PT Start Time 0800    PT Stop Time 0849    PT Time Calculation (min) 49 min           Past Medical History:  Diagnosis Date   ADHD (attention deficit hyperactivity disorder)    no current med.   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   OCD (obsessive compulsive disorder)    Oppositional defiant disorder    PTSD (post-traumatic stress disorder)    Seizures (HCC)    Stuffy nose 07/01/2017   Past Surgical History:  Procedure Laterality Date   GANGLION CYST EXCISION Right 07/03/2017   Procedure: RIGHT HAND NEUROMA REMOVAL;  Surgeon: Jerri Kay HERO, MD;  Location: [facility];  Service: Orthopedics;  Laterality: Right;   neuroma of hand removed     TENDON LENGTHENING Bilateral    as a child   TOOTH EXTRACTION     Patient Active Problem List   Diagnosis Date Noted   ERRONEOUS ENCOUNTER--DISREGARD 04/23/2023   Chest pain of uncertain etiology 10/16/2022   Palpitations 10/16/2022   Transient vision disturbance of both eyes 10/16/2022   Syncope 09/27/2022   Tachycardia, paroxysmal (HCC) 09/27/2022   Hypoglycemia 09/27/2022   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   Diaphoresis 09/27/2022   Periumbilical abdominal pain 12/27/2021   Epididymitis, left 11/17/2021   OSA on CPAP 10/15/2018   Hyperlipidemia 01/31/2018   Sleep disorder, circadian, delayed sleep phase type 11/11/2017   Mild intermittent asthma 11/11/2017   Neuroma of hand 06/24/2017   Snoring 12/08/2015   OCD (obsessive compulsive disorder) 08/06/2012   Episodic mood disorder 07/18/2011   ADHD (attention deficit hyperactivity  disorder), combined type 07/18/2011    PCP: Alphonsa Glendia LABOR, MD  REFERRING PROVIDER: Margrette Taft BRAVO, MD  REFERRING DIAG: M72.2 (ICD-10-CM) - Plantar fasciitis of right foot  THERAPY DIAG:  Pain in right ankle and joints of right foot  Stiffness of right foot, not elsewhere classified  Muscle weakness (generalized)  Rationale for Evaluation and Treatment: Rehabilitation  ONSET DATE: Nov 24  SUBJECTIVE:   SUBJECTIVE STATEMENT: Pt states RT foot hurts on top and bottom today. Over did at home/work  PERTINENT HISTORY: COPD/asthma  PAIN:  Are you having pain? Yes: NPRS scale: 5/10 Pain location: bottom of foot Pain description: hard to get moving; sore and sharp Aggravating factors: Sitting after being on it Relieving factors: stretching, walking  PRECAUTIONS: None  RED FLAGS: None   WEIGHT BEARING RESTRICTIONS: No  FALLS:  Has patient fallen in last 6 months? No  LIVING ENVIRONMENT: Lives with: lives with their family (2 kids) Lives in: House/apartment Stairs: 2 step ups Has following equipment at home: None  OCCUPATION: [employer]  PLOF: Independent  PATIENT GOALS: Improve strength and mobility  NEXT MD VISIT: PRN  OBJECTIVE:  Note: Objective measures were completed at Evaluation unless otherwise noted.  DIAGNOSTIC FINDINGS: R foot x-ray 09/02/23 IMPRESSION: No acute osseous abnormality.  Calcaneal spur  PATIENT SURVEYS:  LEFS  Extreme difficulty/unable (0), Quite a bit  of difficulty (1), Moderate difficulty (2), Little difficulty (3), No difficulty (4) Survey date:  08/11/24  Any of your usual work, housework or school activities 2  2. Usual hobbies, recreational or sporting activities 2  3. Getting into/out of the bath 2  4. Walking between rooms 2  5. Putting on socks/shoes 3  6. Squatting  4  7. Lifting an object, like a bag of groceries from the floor 3  8. Performing light activities around your home 3  9. Performing heavy  activities around your home 2  10. Getting into/out of a car 3  11. Walking 2 blocks 2  12. Walking 1 mile 2  13. Going up/down 10 stairs (1 flight) 2  14. Standing for 1 hour 2  15.  sitting for 1 hour 4  16. Running on even ground 2  17. Running on uneven ground 2  18. Making sharp turns while running fast 2  19. Hopping  3  20. Rolling over in bed 3  Score total:  49     COGNITION: Overall cognitive status: Within functional limits for tasks assessed     SENSATION: WFL  EDEMA:  None  MUSCLE LENGTH: Did not assess  POSTURE: No Significant postural limitations  PALPATION: TTP distal plantar fascia  LOWER EXTREMITY ROM:  Active ROM Right eval Left eval  Hip flexion    Hip extension    Hip abduction    Hip adduction    Hip internal rotation    Hip external rotation    Knee flexion    Knee extension    Ankle dorsiflexion 10 12  Ankle plantarflexion 50 60  Ankle inversion 25 35  Ankle eversion 25 p! 30   (Blank rows = not tested)  LOWER EXTREMITY MMT:  MMT Right eval Left eval  Hip flexion 5 5  Hip extension    Hip abduction sitting 5 5  Hip adduction    Hip internal rotation    Hip external rotation    Knee flexion 4- 4-  Knee extension 5 5  Ankle dorsiflexion 5 5  Ankle plantarflexion 4 5  Ankle inversion 4- 5  Ankle eversion 5 5   (Blank rows = not tested)  LOWER EXTREMITY SPECIAL TESTS:  Did not assess  FUNCTIONAL TESTS:  SLS: Right 5.37 sec (increases pain), Left 8.94 sec Single leg heel raise on L: 7 times, R: at least 10  GAIT: Distance walked: Into clinic Assistive device utilized: None Level of assistance: Complete Independence Comments: Normal reciprocal pattern                                                                                                                                TREATMENT DATE:  09/11/24 Nustep L3 x UEs/Les Rocker board   x 4 mins calf stretch Ankle isolator 1# DF 3x10, CW x 10, CCW  3x10 Manual therapy: STM & TPR posterior tibialis and plantar fascia  x 11 mins      Standing calf raises 2x 15 at door Standing  toe raises   2x 15  at door Sitting figure 4 stretch with ankle inversion stretch x30 Sitting figure 4 stretch with ankle eversion stretch x 30 Sitting ankle eversion and inversion yellow TB 3 x 10 Big toe abd 2x10 Sitting heel raise with ball squeeze 2x10  Seated   short foot  x 6 hold 10 secs  08/11/24 Eval only per Medicaid    PATIENT EDUCATION:  Education details: Exam findings, POC Person educated: Patient Education method: Medical Illustrator Education comprehension: verbalized understanding, returned demonstration, and needs further education  HOME EXERCISE PROGRAM: Access Code: [email or web address] URL: [email or web address] Date: 08/24/2024 Prepared by: Gellen April Earnie Starring  Exercises - Tibialis Posterior Stretch at Wall  - 1 x daily - 7 x weekly - 2 sets - 30 sec hold - Seated Ankle Inversion Eversion PROM  - 1 x daily - 7 x weekly - 2 sets - 30 sec hold - Seated Ankle Eversion with Resistance (Mirrored)  - 1 x daily - 7 x weekly - 2 sets - 10 reps - Seated Calf Raise With Small Ball at Heels  - 1 x daily - 7 x weekly - 2 sets - 10 reps - Isometric Ankle Inversion  - 1 x daily - 7 x weekly - 2 sets - 10 reps  ASSESSMENT:  CLINICAL IMPRESSION: Pt arrived today reporting RT  foot pain on top/ bottom. He was able to continue with  some Strengthening/stretching as well as mm activation f/b STW and did well  OBJECTIVE IMPAIRMENTS: Abnormal gait, decreased activity tolerance, decreased balance, decreased coordination, decreased endurance, decreased mobility, difficulty walking, decreased ROM, decreased strength, increased fascial restrictions, increased muscle spasms, improper body mechanics, postural dysfunction, and pain.   ACTIVITY LIMITATIONS: standing, transfers, locomotion level, and caring for  others  PARTICIPATION LIMITATIONS: meal prep, cleaning, laundry, shopping, community activity, occupation, and yard work  PERSONAL FACTORS: Age, Fitness, Past/current experiences, and Time since onset of injury/illness/exacerbation are also affecting patient's functional outcome.   REHAB POTENTIAL: Good  CLINICAL DECISION MAKING: Evolving/moderate complexity  EVALUATION COMPLEXITY: Moderate   GOALS: Goals reviewed with patient? Yes  SHORT TERM GOALS: Target date: 09/08/2024  Pt will be ind with initial HEP Baseline: Goal status: MET  2.  Pt will demo L = R ankle ROM without pain Baseline:  Goal status: partially met    LONG TERM GOALS: Target date: 10/06/2024   Pt will be ind with management and progression of HEP Baseline:  Goal status: On going  2.  Pt will be able to perform single leg heel raise x20 to demo increased strength and endurance Baseline:  Goal status: On going  3.  Pt will be able to perform SLS x 10 sec bilat to demo increased standing stability without pain Baseline:  Goal status: On going  4.  Pt will report reduction in pain by >/=75% Baseline:  Goal status:On going  5.  Pt will have improved LEFS to >/=59 to demo MCID Baseline:  Goal status: On going    PLAN:  PT FREQUENCY: 1x/week  PT DURATION: 8 weeks  PLANNED INTERVENTIONS: 97164- PT Re-evaluation, 97750- Physical Performance Testing, 97110-Therapeutic exercises, 97530- Therapeutic activity, [id number]- Neuromuscular re-education, 97535- Self Care, 02859- Manual therapy, [id number]- Gait training, [phone]- Orthotic Initial, [phone]- Vasopneumatic device, [id number]- Ultrasound, [id number]- Ionotophoresis 4mg /ml Dexamethasone , 79439 (1-2 muscles), 20561 (3+ muscles)- Dry Needling, Patient/Family education, Balance training, Taping, Joint mobilization,  Cryotherapy, and Moist heat  PLAN FOR NEXT SESSION:  NO MODALITIES       Initiate gastroc/soleus strengthening, posterior tib strengthening, arch stabilization  exercises.    Shundra Wirsing,CHRIS, PTA, 09/11/2024, 8:56 AM  "

## 2024-09-15 ENCOUNTER — Encounter: Payer: Self-pay | Admitting: Psychology

## 2024-09-15 NOTE — Progress Notes (Signed)
 Neuropsychology Visit  Patient:  [name]   DOB: 20-Jan-1991  MR Number: [id number]  Location: [facility] [place] [facility] [address] [place], [address] [place] [address] Dept: [phone]  Date of Service: 09/08/2024  Start: 8 AM End: 9 AM  Duration of Service: 1 Hour  Provider/Observer:     Brent JONELLE Asa PsyD  Chief Complaint:      Chief Complaint  Patient presents with   Anxiety   Depression   Sleeping Problem   Stress   ADHD    Reason For Service:     [name] is a 34 year old male referred by Brent Guerin, MD, for therapeutic interventions. He reconnected with his prior psychiatrist, Brent Gull, MD. The patient has a history of a long-term mood disorder, attentional deficits, and anxiety/OCD symptoms, with a history of episodic difficulties in relationships.  The patient reports ongoing relationship difficulties with his partner, [name], related to a lack of physical intimacy for over a year. This is attributed to his past infidelity and relationship instability. He expressed his need for physical connection, which has led to negative thoughts and feelings of inadequacy. A recent conversation with his partner clarified that her hesitation stems from fear of his past behaviors recurring. They have agreed to take slow steps towards re-establishing physical intimacy, which has included skin-to-skin contact, and he reports feeling closer to her as a result. He also reports a recent incident at work where he was flirtatiously approached but remained uninterested, reinforcing his commitment to his partner.  He reports significant stress related to his children, [name] and [name]. [name] exhibits jealous and bullying behaviors towards [name], which he finds upsetting. He notes that [name] is more diligent and cooperative, while [name] is prone to giving up and complaining. He recently  purchased a new bed for [name], which caused [name] to react with jealousy and anger. He also discussed future plans with his partner, including moving in together, purchasing a larger house, and having more children. 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  He is currently employed at [employer] and is seeking another job. He reports increased hours and a recent raise, which has alleviated financial stress. He has a friend, [name], who has offered a referral for a position at [employer], which he is considering. He notes his criminal background is a barrier until March 2028. He expresses frustration with his current job, citing a estate manager/land agent and a lack of support from his agricultural consultant, [name], regarding a promised raise. He has a history of going over his manager's head to the owner, [name], to get a previous raise.  Treatment Interventions:  Today we worked on pharmacologist and strategies to surround mood disturbance and numerous psychosocial stressors.  Participation Level:   Active  Participation Quality:  Appropriate      Behavioral Observation:  Well Groomed, Alert, and Appropriate.   Current Psychosocial Factors: Reports interpersonal conflict with his partner, [name], over holiday family arrangements. He felt excluded from her family's Thanksgiving and Christmas gatherings, leading to an argument where he expressed his frustration. Subsequently, they have discussed their future, with plans for her to move in by mid-year and intentions to marry this year. They have started using a wedding planning application to manage budgeting. A significant stressor involves the dissolution of a 20-year friendship with his best friend, [name], following a conflict over a  car. The patient acquired a [address] that previously belonged to [name]. [name] expressed resentment and jealousy, leading to a heated exchange and the  termination of their friendship. Patient reports that [name] has also cut ties with other friends and family, suggesting the conflict may not be specific to the patient. Reports ongoing sleep disturbance, characterized by waking every 30 minutes after an initial period of sleep. He is able to return to sleep without significant difficulty. He uses a CPAP machine for sleep and noted a mineral buildup from using tap water in the humidifier, which he plans to address by using distilled water. He also reports financial stress related to utility bills, although the most recent bill was lower. Discussed household maintenance issues, including a leaking sink and a malfunctioning water heater. The hot water supply is diminished, and he suspects a failing heating element. He plans to repair it.  Content of Session:   Reviewed relationship dynamics, parenting challenges, and career/job search status. Therapeutic interventions included psychoeducation on child development, communication strategies, and coping with workplace stress.   Effectiveness of Interventions: Engaged well in the session, demonstrating insight into his relationship patterns and expressing a desire for growth and stability. Reported feeling more hopeful about the relationship's future.  Target Goals:   Focus on improving relationship communication and intimacy, managing parenting stress, and securing stable employment. Continue to address mood and anxiety symptoms.  Goals Last Reviewed:   09/08/2024  Goals Addressed Today:    Addressed relationship dynamics, including communication about intimacy and future planning. Discussed parenting strategies for managing sibling conflict. Reviewed career goals and job search strategies.  Impression/Diagnosis:   [name] is a 34 year old male who was referred by Brent Guerin, MD for therapeutic interventions.  The patient is also recently reconnected with his prior psychiatrist Brent Gull, MD with  behavioral health in [place].  Both Dr. Gull and myself had seen [name] for some time with me last seen him in 2018.  The patient has been dealing with a long-term mood disorder, attentional deficits and anxiety/OCD symptoms.  He has had times of stable functioning in the past but also episodic difficulties in multiple challenging relationship issues.   The patient has had considerable stress recently.  His mother contracted COVID-19 and was hospitalized and passed away from COVID-19/Covid pneumonia.  The patient was very close to his mother and they live together and she was a very important person in his life including helping with taking care of his children who the patient has full custody of.  Diagnosis:   Unspecified mood (affective) disorder  Mixed obsessional thoughts and acts  OSA on CPAP    [name], Psy.D. Clinical Psychologist Neuropsychologist

## 2024-09-18 ENCOUNTER — Ambulatory Visit: Admitting: *Deleted

## 2024-09-18 ENCOUNTER — Encounter: Payer: Self-pay | Admitting: *Deleted

## 2024-09-18 DIAGNOSIS — M25571 Pain in right ankle and joints of right foot: Secondary | ICD-10-CM

## 2024-09-18 DIAGNOSIS — R2689 Other abnormalities of gait and mobility: Secondary | ICD-10-CM

## 2024-09-18 DIAGNOSIS — M25674 Stiffness of right foot, not elsewhere classified: Secondary | ICD-10-CM

## 2024-09-18 DIAGNOSIS — M6281 Muscle weakness (generalized): Secondary | ICD-10-CM

## 2024-09-18 NOTE — Therapy (Signed)
 " OUTPATIENT PHYSICAL THERAPY TREATMENT   Patient Name: [name] MRN: [id number] DOB:[birth year], 34 y.o., male Today's Date: 09/18/2024  END OF SESSION:  PT End of Session - 09/18/24 0806     Visit Number 6    Number of Visits 8    Date for Recertification  10/06/24    Authorization Type [employer]    Authorization - Number of Visits 6    PT Start Time 0800    PT Stop Time 0850    PT Time Calculation (min) 50 min           Past Medical History:  Diagnosis Date   ADHD (attention deficit hyperactivity disorder)    no current med.   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   OCD (obsessive compulsive disorder)    Oppositional defiant disorder    PTSD (post-traumatic stress disorder)    Seizures (HCC)    Stuffy nose 07/01/2017   Past Surgical History:  Procedure Laterality Date   GANGLION CYST EXCISION Right 07/03/2017   Procedure: RIGHT HAND NEUROMA REMOVAL;  Surgeon: Jerri Kay HERO, MD;  Location: [facility];  Service: Orthopedics;  Laterality: Right;   neuroma of hand removed     TENDON LENGTHENING Bilateral    as a child   TOOTH EXTRACTION     Patient Active Problem List   Diagnosis Date Noted   ERRONEOUS ENCOUNTER--DISREGARD 04/23/2023   Chest pain of uncertain etiology 10/16/2022   Palpitations 10/16/2022   Transient vision disturbance of both eyes 10/16/2022   Syncope 09/27/2022   Tachycardia, paroxysmal (HCC) 09/27/2022   Hypoglycemia 09/27/2022   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   Diaphoresis 09/27/2022   Periumbilical abdominal pain 12/27/2021   Epididymitis, left 11/17/2021   OSA on CPAP 10/15/2018   Hyperlipidemia 01/31/2018   Sleep disorder, circadian, delayed sleep phase type 11/11/2017   Mild intermittent asthma 11/11/2017   Neuroma of hand 06/24/2017   Snoring 12/08/2015   OCD (obsessive compulsive disorder) 08/06/2012   Episodic mood disorder 07/18/2011    ADHD (attention deficit hyperactivity disorder), combined type 07/18/2011    PCP: Alphonsa Glendia LABOR, MD  REFERRING PROVIDER: Margrette Taft BRAVO, MD  REFERRING DIAG: M72.2 (ICD-10-CM) - Plantar fasciitis of right foot  THERAPY DIAG:  Pain in right ankle and joints of right foot  Stiffness of right foot, not elsewhere classified  Muscle weakness (generalized)  Other abnormalities of gait and mobility  Rationale for Evaluation and Treatment: Rehabilitation  ONSET DATE: Nov 24  SUBJECTIVE:   SUBJECTIVE STATEMENT: Pt states he has no pain today and is doing great.  PERTINENT HISTORY: COPD/asthma  PAIN:  Are you having pain? Yes: NPRS scale: 0/10 Pain location: bottom of foot Pain description: hard to get moving; sore and sharp Aggravating factors: Sitting after being on it Relieving factors: stretching, walking  PRECAUTIONS: None  RED FLAGS: None   WEIGHT BEARING RESTRICTIONS: No  FALLS:  Has patient fallen in last 6 months? No  LIVING ENVIRONMENT: Lives with: lives with their family (2 kids) Lives in: House/apartment Stairs: 2 step ups Has following equipment at home: None  OCCUPATION: [employer]  PLOF: Independent  PATIENT GOALS: Improve strength and mobility  NEXT MD VISIT: PRN  OBJECTIVE:  Note: Objective measures were completed at Evaluation unless otherwise noted.  DIAGNOSTIC FINDINGS: R foot x-ray 09/02/23 IMPRESSION: No acute osseous abnormality.  Calcaneal spur  PATIENT SURVEYS:  LEFS  Extreme difficulty/unable (0), Quite a bit of difficulty (1), Moderate difficulty (2), Little difficulty (3), No difficulty (4) Survey date:  08/11/24  Any of your usual work, housework or school activities 2  2. Usual hobbies, recreational or sporting activities 2  3. Getting into/out of the bath 2  4. Walking between rooms 2  5. Putting on socks/shoes 3  6. Squatting  4  7. Lifting an object, like a bag of groceries from the floor 3  8. Performing  light activities around your home 3  9. Performing heavy activities around your home 2  10. Getting into/out of a car 3  11. Walking 2 blocks 2  12. Walking 1 mile 2  13. Going up/down 10 stairs (1 flight) 2  14. Standing for 1 hour 2  15.  sitting for 1 hour 4  16. Running on even ground 2  17. Running on uneven ground 2  18. Making sharp turns while running fast 2  19. Hopping  3  20. Rolling over in bed 3  Score total:  49     COGNITION: Overall cognitive status: Within functional limits for tasks assessed     SENSATION: WFL  EDEMA:  None  MUSCLE LENGTH: Did not assess  POSTURE: No Significant postural limitations  PALPATION: TTP distal plantar fascia  LOWER EXTREMITY ROM:  Active ROM Right eval Left eval  Hip flexion    Hip extension    Hip abduction    Hip adduction    Hip internal rotation    Hip external rotation    Knee flexion    Knee extension    Ankle dorsiflexion 10 12  Ankle plantarflexion 50 60  Ankle inversion 25 35  Ankle eversion 25 p! 30   (Blank rows = not tested)  LOWER EXTREMITY MMT:  MMT Right eval Left eval  Hip flexion 5 5  Hip extension    Hip abduction sitting 5 5  Hip adduction    Hip internal rotation    Hip external rotation    Knee flexion 4- 4-  Knee extension 5 5  Ankle dorsiflexion 5 5  Ankle plantarflexion 4 5  Ankle inversion 4- 5  Ankle eversion 5 5   (Blank rows = not tested)  LOWER EXTREMITY SPECIAL TESTS:  Did not assess  FUNCTIONAL TESTS:  SLS: Right 5.37 sec (increases pain), Left 8.94 sec Single leg heel raise on L: 7 times, R: at least 10  GAIT: Distance walked: Into clinic Assistive device utilized: None Level of assistance: Complete Independence Comments: Normal reciprocal pattern                                                                                                                                TREATMENT DATE:       RT foot 09/18/24 Nustep L3 x 12 min UEs/LEs Rocker board    x 4 mins calf stretch Ankle  isolator 1# DF 3x10, CW x 10, CCW 3x10 1 1/2# posterior tib. 3x 10-15 Manual therapy: STM & TPR posterior tibialis and plantar fascia x 11 mins      Standing calf raises 2x 15 at door Standing  toe raises   2x 15  at door Sitting figure 4 stretch with ankle inversion stretch x30 Sitting figure 4 stretch with ankle eversion stretch x 30 Sitting ankle eversion and inversion yellow TB 3 x 10 Big toe abd 2x10 Sitting heel raise with ball squeeze 2x10  Seated   short foot  x 6 hold 10 secs  08/11/24 Eval only per Medicaid    PATIENT EDUCATION:  Education details: Exam findings, POC Person educated: Patient Education method: Medical Illustrator Education comprehension: verbalized understanding, returned demonstration, and needs further education  HOME EXERCISE PROGRAM: Access Code: [email or web address] URL: [email or web address] Date: 08/24/2024 Prepared by: Gellen April Earnie Starring  Exercises - Tibialis Posterior Stretch at Wall  - 1 x daily - 7 x weekly - 2 sets - 30 sec hold - Seated Ankle Inversion Eversion PROM  - 1 x daily - 7 x weekly - 2 sets - 30 sec hold - Seated Ankle Eversion with Resistance (Mirrored)  - 1 x daily - 7 x weekly - 2 sets - 10 reps - Seated Calf Raise With Small Ball at Heels  - 1 x daily - 7 x weekly - 2 sets - 10 reps - Isometric Ankle Inversion  - 1 x daily - 7 x weekly - 2 sets - 10 reps  ASSESSMENT:  CLINICAL IMPRESSION: Pt arrived today reporting  no RT  foot pain. He was able to continue with  ' Strengthening/stretching as well as mm activation exs and did well. Still with some notable soreness posterior tib during STW. Majority of LTGs were met. DC to HEP.   MD f/u FEB.  OBJECTIVE IMPAIRMENTS: Abnormal gait, decreased activity tolerance, decreased balance, decreased coordination, decreased endurance, decreased mobility, difficulty walking, decreased ROM, decreased strength, increased fascial  restrictions, increased muscle spasms, improper body mechanics, postural dysfunction, and pain.   ACTIVITY LIMITATIONS: standing, transfers, locomotion level, and caring for others  PARTICIPATION LIMITATIONS: meal prep, cleaning, laundry, shopping, community activity, occupation, and yard work  PERSONAL FACTORS: Age, Fitness, Past/current experiences, and Time since onset of injury/illness/exacerbation are also affecting patient's functional outcome.   REHAB POTENTIAL: Good  CLINICAL DECISION MAKING: Evolving/moderate complexity  EVALUATION COMPLEXITY: Moderate   GOALS: Goals reviewed with patient? Yes  SHORT TERM GOALS: Target date: 09/08/2024  Pt will be ind with initial HEP Baseline: Goal status: MET  2.  Pt will demo L = R ankle ROM without pain Baseline:  Goal status: met    LONG TERM GOALS: Target date: 10/06/2024   Pt will be ind with management and progression of HEP Baseline:  Goal status: MET  2.  Pt will be able to perform single leg heel raise x20 to demo increased strength and endurance Baseline:  Goal status: NM not tested  3.  Pt will be able to perform SLS x 10 sec bilat to demo increased standing stability without pain Baseline:  Goal status: Met  4.  Pt will report reduction in pain by >/=75% Baseline:  Goal status:  met  5.  Pt will have improved LEFS to >/=59 to demo MCID Baseline:  Goal status:   60   MET    PLAN:  PT FREQUENCY: 1x/week  PT DURATION: 8 weeks  PLANNED INTERVENTIONS: 02835- PT Re-evaluation,  02249- Physical Performance Testing, 97110-Therapeutic exercises, 97530- Therapeutic activity, [id number]- Neuromuscular re-education, 97535- Self Care, 02859- Manual therapy, [id number]- Gait training, [phone]- Orthotic Initial, [id number]- Vasopneumatic device, [id number]- Ultrasound, [id number]- Ionotophoresis 4mg /ml Dexamethasone , 79439 (1-2 muscles), [id number] (3+ muscles)- Dry Needling, Patient/Family education, Balance training, Taping, Joint mobilization,  Cryotherapy, and Moist heat  PLAN :   DC to HEP    Kunal Levario,CHRIS, PTA, 09/18/2024, 9:11 AM  "

## 2024-09-24 ENCOUNTER — Other Ambulatory Visit: Payer: Self-pay | Admitting: Family Medicine

## 2024-10-01 ENCOUNTER — Ambulatory Visit: Admitting: Orthopedic Surgery

## 2024-10-14 ENCOUNTER — Ambulatory Visit: Admitting: Orthopedic Surgery

## 2024-11-10 ENCOUNTER — Encounter: Attending: Psychology | Admitting: Psychology

## 2024-12-01 ENCOUNTER — Encounter: Attending: Psychology | Admitting: Psychology

## 2024-12-29 ENCOUNTER — Encounter: Admitting: Psychology

## 2025-01-05 ENCOUNTER — Encounter: Admitting: Psychology

## 2025-02-02 ENCOUNTER — Encounter: Admitting: Psychology

## 2025-03-02 ENCOUNTER — Encounter: Admitting: Psychology

## 2025-04-06 ENCOUNTER — Encounter: Admitting: Psychology

## 2025-05-04 ENCOUNTER — Encounter: Admitting: Psychology

## 2025-05-24 ENCOUNTER — Ambulatory Visit: Admitting: Adult Health

## 2025-06-01 ENCOUNTER — Encounter: Admitting: Psychology

## 2025-06-29 ENCOUNTER — Encounter: Admitting: Psychology

## 2025-08-03 ENCOUNTER — Encounter: Admitting: Psychology
